# Patient Record
Sex: Female | Born: 1980
Health system: Southern US, Community
[De-identification: ages and names within clinical notes are randomized; demographics above are authoritative.]

## PROBLEM LIST (undated history)

## (undated) DIAGNOSIS — Z22322 Carrier or suspected carrier of Methicillin resistant Staphylococcus aureus: Secondary | ICD-10-CM

## (undated) DIAGNOSIS — R519 Headache, unspecified: Secondary | ICD-10-CM

## (undated) DIAGNOSIS — F419 Anxiety disorder, unspecified: Secondary | ICD-10-CM

## (undated) HISTORY — PX: DIAGNOSTIC LAPAROSCOPY: SUR761

## (undated) HISTORY — PX: TONSILLECTOMY: SUR1361

---

## 2006-01-23 ENCOUNTER — Emergency Department (HOSPITAL_COMMUNITY): Admission: EM | Admit: 2006-01-23 | Discharge: 2006-01-23 | Payer: Self-pay | Admitting: Emergency Medicine

## 2006-08-19 ENCOUNTER — Inpatient Hospital Stay (HOSPITAL_COMMUNITY): Admission: AD | Admit: 2006-08-19 | Discharge: 2006-08-19 | Payer: Self-pay | Admitting: Obstetrics and Gynecology

## 2006-09-16 ENCOUNTER — Inpatient Hospital Stay (HOSPITAL_COMMUNITY): Admission: AD | Admit: 2006-09-16 | Discharge: 2006-09-16 | Payer: Self-pay | Admitting: Obstetrics and Gynecology

## 2006-09-27 ENCOUNTER — Inpatient Hospital Stay (HOSPITAL_COMMUNITY): Admission: RE | Admit: 2006-09-27 | Discharge: 2006-09-30 | Payer: Self-pay | Admitting: Obstetrics and Gynecology

## 2007-08-03 ENCOUNTER — Ambulatory Visit (HOSPITAL_BASED_OUTPATIENT_CLINIC_OR_DEPARTMENT_OTHER): Admission: RE | Admit: 2007-08-03 | Discharge: 2007-08-03 | Payer: Self-pay | Admitting: Obstetrics and Gynecology

## 2011-01-06 NOTE — Op Note (Signed)
NAMETERITA, Caitlin Bryant           ACCOUNT NO.:  [id number]   MEDICAL RECORD NO.:  [id number]          PATIENT TYPE:  AMB   LOCATION:  [facility]                         FACILITY:  [facility]   PHYSICIAN:  Malva Limes, M.D.    DATE OF BIRTH:  [birth year]   DATE OF PROCEDURE:  08/03/2007  DATE OF DISCHARGE:                               OPERATIVE REPORT   PREOPERATIVE DIAGNOSIS:  Chronic abdominal and pelvic pain.   POSTOPERATIVE DIAGNOSIS:  Chronic abdominal and pelvic pain.   PROCEDURE:  Diagnostic laparoscopy.   SURGEON:  Malva Limes, M.D.   ANESTHESIA:  General endotracheal anesthesia.   ANTIBIOTICS:  Ancef 1 gram.   DRAINS:  A red rubber catheter in bladder.   COMPLICATIONS:  None.   SPECIMENS:  None.   ESTIMATED BLOOD LOSS:  Minimal.   DESCRIPTION OF PROCEDURE:  The patient was taken to the operating room  where general anesthetic was administered without complications.  She  was then placed in the dorsal lithotomy position and prepped with  Betadine.  Her bladder was drained with a red rubber catheter.  A Hulka  tenaculum was applied to the anterior cervical lip.  At this point the  umbilicus was injected with 0.25% Marcaine.  A vertical skin incision  was made.  The fascia was grasped with the Kocher and entered sharply.  The parietal peritoneum was entered sharply and #0 Vicryl suture was  placed in a pursestring fashion.  The Hasson cannula was placed into the  peritoneal cavity.  Three liters of carbon dioxide was insufflated.  The  patient was then placed in Trendelenburg.  The scope was then placed.  On examination the patient had a normal-appearing liver and gallbladder.  There was no evidence of any kind of abdominal adhesions.  The bowel  appeared to be normal.  The 5 mm port was placed in the suprapubic  region under direct visualization.  A grasper was then placed through.  On examination the patient had normal fallopian tubes and ovaries  bilaterally.  The  posterior cul-de-sac was completely normal.  The  appendix was seen.  It was free and without erythema.  There was no  evidence of endometriosis throughout the pelvis.  The adnexa were  completely free and normal.  The patient had minimal adhesions along the  C-section scar in the anterior cul-de-sac.  These were taken down with  blunt dissection.  It was felt that these were minimal and not likely  the cause of the patient's pain.  The uterus appeared to be normal, free  and normal.  The sigmoid colon appeared to be normal.   At this point the procedure was concluded.  Instruments were removed.  The pneumoperitoneum released.  The fascia was closed with #0 Vicryl  suture.  The skin was closed with #4-0 Vicryl suture.   DISPOSITION:  The patient will be discharged home.  She will follow up  in the office in two weeks.  At that time if the pain persists, the  patient may be started on Neurontin for chronic pelvic pain, or  transferred to the care of a pain clinic.  ______________________________  Malva Limes, M.D.     MA/MEDQ  D:  08/03/2007  T:  08/03/2007  Job:  [id number]

## 2011-01-09 NOTE — Op Note (Signed)
[name], Caitlin Bryant NO.:  [id number]   MEDICAL RECORD NO.:  [id number]          PATIENT TYPE:  INP   LOCATION:  9125                          FACILITY:  [facility]   PHYSICIAN:  Malva Limes, M.D.    DATE OF BIRTH:  1980/10/12   DATE OF PROCEDURE:  09/27/2006  DATE OF DISCHARGE:                               OPERATIVE REPORT   PREOPERATIVE DIAGNOSES:  1. Intrauterine pregnancy at term.  2. History of prior cesarean section.  3. Patient declines attempt at vaginal birth after cesarean section.   POSTOPERATIVE DIAGNOSES:  1. Intrauterine pregnancy at term.  2. History of prior cesarean section.  3. Patient declines attempt at vaginal birth after cesarean section.   PROCEDURE:  Repeat low transverse cesarean section.   SURGEON:  Malva Limes, M.D.   ASSISTANT:  Miguel Aschoff, M.D.   ANESTHESIA:  Spinal.   BLOOD LOSS:  900 mL.   SPECIMENS:  None.   COMPLICATIONS:  None.   FINDINGS:  The patient delivered one live viable female infant weighing  7 pounds 12 ounces.   PROCEDURE:  The patient was taken to the operating room where spinal  anesthetic was administered without complication.  She was then placed  in dorsal supine position with left lateral tilt.  The patient was  prepped with Betadine and a Foley catheter was placed.  She was then  draped in the usual fashion for this procedure.  An incision was made  through the previous scar.  This was carried down to the fascia.  Fascia  was entered in the midline and extended laterally with Mayo scissors.  The rectus muscles were then taken from the fascia with the Bovie.  Rectus muscle were divided in the midline, taken to superiorly and  inferiorly.  The parietal peritoneum was entered sharply and taken  superiorly and inferiorly.  The bladder flap was taken down sharply.  A  low transverse uterine incision was made in the midline and extended  laterally with blunt dissection.  Amniotic fluid was noted to be  clear.  The vacuum extractor was used to remove the infant.  Once the head was  delivered, the oropharynx and nostrils were bulb suctioned x1.  There  was a nuchal cord x1.  Remaining infant was then delivered.  Cord doubly  clamped and cut and the infant handed to the awaiting NICU team.  Placenta was manually removed.  The uterus was exteriorized.  Uterine  cavity was cleaned with wet lap.  The uterine incision was closed in a  single layer of 0 Monocryl in a running locking fashion.  Bladder flap  was closed using 2-0 Monocryl in a running fashion.  The uterus was  placed back into the abdominal cavity.  Hemostasis was again checked and  found to be adequate.  Parietal peritoneum and rectus muscles were  reapproximated in the midline using 2-0 Monocryl suture.  Fascia was  closed using 0 Monocryl suture in a running fashion.  The subcuticular  tissue was made hemostatic with the Bovie.  Stainless steel clips were  used to close the skin.  The patient tolerated  the procedure well.  She  was taken to the recovery room in stable condition.  Instrument and lap  counts correct x2.           ______________________________  Malva Limes, M.D.     MA/MEDQ  D:  09/27/2006  T:  09/27/2006  Job:  [id number]

## 2011-01-09 NOTE — Discharge Summary (Signed)
[name], Caitlin Bryant           ACCOUNT NO.:  [id number]   MEDICAL RECORD NO.:  [id number]          PATIENT TYPE:  INP   LOCATION:  9125                          FACILITY:  [facility]   PHYSICIAN:  Kendra H. Tenny Craw, MD     DATE OF BIRTH:  1981/07/06   DATE OF ADMISSION:  09/27/2006  DATE OF DISCHARGE:  09/30/2006                               DISCHARGE SUMMARY   FINAL DIAGNOSIS:  Intrauterine pregnancy at term.  History of prior  cesarean section.  The patient declines attempted vaginal birth after  cesarean section.  Positive group B strep.   PROCEDURE:  Repeat low transverse cesarean section.  Surgeon:  Dr. Malva Limes.  Assistant:  Dr. Miguel Aschoff.  Complications:  None.   This 30 year old, G2, P1-0-0-2 presents at term for a repeat cesarean  section.  The patient had a prior cesarean section with her last  pregnancy and desires a repeat with this pregnancy as well.  Her  antepartum course had been uncomplicated.  She did have a positive group  B strep culture obtained in the office around 35 weeks.  She is taken to  the operating room at term on February4,2008 for a repeat low transverse  cesarean section by Dr. Malva Limes.  She had the delivery of a 7-  pound 12-ounce female infant with Apgars of 9and 9.  Delivery went  without complications.  The patient's postoperative course was benign.  She did have some postoperative temperatures up to about 101 on  postoperative day #1.  The patient also was having a cough and sinus  congestion at this time.  A chest x-ray was performed which showed  bilateral atelectasis with small pleural effusion but no pneumonia.  The  patient's white count was only 8.2 at that point.  By postoperative day  #2, temperatures returned to normal.  White blood cell count did not  increase.  IV antibiotics were stopped.  The patient was started on oral  Ceftin 5 mg every 12 hours and was felt ready for discharge by  postoperative day #3.  She was sent home  on Ceftin 500 mg b.i.d. for her  upper respiratory infection.  She was told to decrease activities, told  to continue her vitamins, was given Percocet 1 to 2 every 4 to 6 hours  as needed for pain.  Was to follow up in our office in 6 weeks.  Of  course to call with increased temperatures, bleeding or pain.   DISCHARGE LABORATORY DATA:  The patient had a hemoglobin of 11.7, white  blood cell count of 6.5, and platelets of 138,000.      [name], P.A.-C.      Freddrick March. Tenny Craw, MD  Electronically Signed    MB/MEDQ  D:  10/26/2006  T:  10/26/2006  Job:  [id number]

## 2011-06-01 LAB — CBC
HCT: 38.4
Hemoglobin: 13.5
MCV: 85.6
WBC: 6.7

## 2011-06-01 LAB — PREGNANCY, URINE: Preg Test, Ur: NEGATIVE

## 2012-11-18 ENCOUNTER — Emergency Department (HOSPITAL_COMMUNITY)
Admission: EM | Admit: 2012-11-18 | Discharge: 2012-11-18 | Disposition: A | Discharge status: Home / Self Care | Payer: BC Managed Care – PPO | Source: Home / Self Care

## 2012-11-18 ENCOUNTER — Encounter (HOSPITAL_COMMUNITY): Payer: Self-pay

## 2012-11-18 ENCOUNTER — Other Ambulatory Visit (HOSPITAL_COMMUNITY)
Admission: RE | Admit: 2012-11-18 | Discharge: 2012-11-18 | Disposition: A | Discharge status: Home / Self Care | Payer: BC Managed Care – PPO | Source: Ambulatory Visit | Attending: Family Medicine | Admitting: Family Medicine

## 2012-11-18 DIAGNOSIS — N760 Acute vaginitis: Secondary | ICD-10-CM | POA: Insufficient documentation

## 2012-11-18 DIAGNOSIS — N949 Unspecified condition associated with female genital organs and menstrual cycle: Secondary | ICD-10-CM

## 2012-11-18 DIAGNOSIS — R42 Dizziness and giddiness: Secondary | ICD-10-CM

## 2012-11-18 DIAGNOSIS — Z113 Encounter for screening for infections with a predominantly sexual mode of transmission: Secondary | ICD-10-CM | POA: Insufficient documentation

## 2012-11-18 LAB — POCT URINALYSIS DIP (DEVICE)
Bilirubin Urine: NEGATIVE
Specific Gravity, Urine: 1.01 (ref 1.005–1.030)

## 2012-11-18 LAB — POCT PREGNANCY, URINE: Preg Test, Ur: NEGATIVE

## 2012-11-18 NOTE — ED Provider Notes (Signed)
Caitlin Bryant is a 32 y.o. female who presents to Urgent Care today for 2 issues.  1) abdominal pain starting over the last several days. Patient notes bilateral lower corner and abdominal pain present for several days. This is not associated with any nausea vomiting diarrhea fever or chills.  She notes some pelvic discomfort or this week and was seen by her primary care provider who obtained a urine culture which was reportedly negative.  Additionally patient notes some mild vaginal discharge started over the last several weeks.  Additionally she has not had a period in 8 days which is unusual for her as she normally has regular cycles. She does note a contraception however her husband has vasectomy. She does not think that she could be pregnant.  She has an appointment with her OB/GYN in 5 or 6 days.  2) vertigo: Patient got off the toilet today and she stood up noted an intense feeling of the room spinning for several seconds. She denies any feeling of syncope or near syncope. Additionally she denies any chest pain palpitations or shortness of breath with this.  This is the first time this has happened.  The vertigo rapidly resolved and has not recurred.    PMH reviewed. History of 2 C-sections History  Substance Use Topics  . Smoking status: Not on file  . Smokeless tobacco: Not on file  . Alcohol Use: Not on file   ROS as above Medications reviewed. No current facility-administered medications for this encounter.   No current outpatient prescriptions on file.    Exam:  BP 112/76  Pulse 73  Temp(Src) 98.4 F (36.9 C) (Oral)  Resp 10  SpO2 100% Gen: Well NAD HEENT: EOMI,  MMM, PERRLA.  Lungs: CTABL Nl WOB Heart: RRR no MRG Abd: NABS, nondistended. Mildly tender bilateral lower quadrants without rebound or guarding.  Exts: Non edematous BL  LE, warm and well perfused.  Pelvic: Normal external genitalia. Vaginal canal with white discharge. No cervical motion tenderness no  adnexal mass or tenderness. Uterus is slightly large but in normal position.  Patient had significant discomfort with the speculum portion of the exam. It was discontinued prior to full visualization of the cervix.  Neuro: Alert and oriented normally conversant. Dix-Hallpike maneuver patient had several beats of nystagmus associated with mild vertigo with left rotation of the head.  Balance is intact gait is normal.   Results for orders placed during the hospital encounter of 11/18/12 (from the past 24 hour(s))  POCT URINALYSIS DIP (DEVICE)     Status: Abnormal   Collection Time    11/18/12  5:18 PM      Result Value Range   Glucose, UA NEGATIVE  NEGATIVE mg/dL   Bilirubin Urine NEGATIVE  NEGATIVE   Ketones, ur NEGATIVE  NEGATIVE mg/dL   Specific Gravity, Urine 1.010  1.005 - 1.030   Hgb urine dipstick TRACE (*) NEGATIVE   pH 7.5  5.0 - 8.0   Protein, ur NEGATIVE  NEGATIVE mg/dL   Urobilinogen, UA 0.2  0.0 - 1.0 mg/dL   Nitrite NEGATIVE  NEGATIVE   Leukocytes, UA NEGATIVE  NEGATIVE  POCT PREGNANCY, URINE     Status: None   Collection Time    11/18/12  5:22 PM      Result Value Range   Preg Test, Ur NEGATIVE  NEGATIVE   No results found.  Assessment and Plan: 32 y.o. female with  1) pelvic pain associated with mild vaginal discharge. Possibly bacterial vaginosis.  Does not appear to be life threatening tonight.  Plan: Reassurance and watchful waiting. Pelvic swab for gonorrhea, Chlamydia, bacterial vaginosis, yeast. Followup with OB/GYN when necessary.  2) vertigo: Dizzy feeling likely vertigo. Possibly mildly positive Dix-Hallpike maneuver. No serious intracranial pathology suspected tonight. Additionally this does not appear to be cardiogenic syncope or near-syncope.  Plan to followup with family practice Dr. if needed.   Discussed warning signs or symptoms. Please see discharge instructions. Patient expresses understanding.     Rodolph Bong, MD 11/18/12 1800

## 2012-11-18 NOTE — ED Notes (Signed)
C/o she missed menses a couple of weeks ago, saw her MD who did a negative UCG test and UA test; since then, has felt increasingly weak , dizzy, nauseated, bloated in lower abdominal area , dizzy. Has an appt to see her GYN next week. Husband had vasectomy. States she had a brief episode of clear vaginal d.c at time her menses was due, but no bleeding or spotting

## 2012-11-22 NOTE — ED Provider Notes (Signed)
Medical screening examination/treatment/procedure(s) were performed by resident physician or non-physician practitioner and as supervising physician I was immediately available for consultation/collaboration.   Broady Lafoy DOUGLAS MD.   Lavern Maslow D Malinda Mayden, MD 11/22/12 2050 

## 2012-11-23 NOTE — ED Notes (Signed)
GC/Chlamydia neg., Affirm: Candida and Trich neg., Gardnerella pos. No Rx. noted. Message sent to Dr. Clementeen Graham.

## 2012-11-24 ENCOUNTER — Other Ambulatory Visit: Payer: Self-pay | Admitting: Obstetrics and Gynecology

## 2012-11-25 ENCOUNTER — Telehealth (HOSPITAL_COMMUNITY): Payer: Self-pay | Admitting: *Deleted

## 2012-11-25 MED ORDER — METRONIDAZOLE 500 MG PO TABS: 500.0000 mg | ORAL_TABLET | Freq: Two times a day (BID) | ORAL | Status: DC

## 2012-11-25 NOTE — ED Notes (Signed)
4/3 Message sent to Dr. Tressia Danas for orders.  4/4 Flagyl ordered. I called pt. and left a message to call. Vassie Moselle 11/25/2012

## 2012-11-28 ENCOUNTER — Telehealth (HOSPITAL_COMMUNITY): Payer: Self-pay | Admitting: *Deleted

## 2012-11-28 NOTE — ED Notes (Signed)
4/4  Order for Flagyl obtained from Dr. Tressia Danas.  I called and left a message for patient to call. Cherly Anderson M

## 2012-11-30 ENCOUNTER — Telehealth (HOSPITAL_COMMUNITY): Payer: Self-pay | Admitting: *Deleted

## 2012-12-01 ENCOUNTER — Telehealth (HOSPITAL_COMMUNITY): Payer: Self-pay | Admitting: *Deleted

## 2012-12-01 NOTE — ED Notes (Signed)
Unable to contact pt. by phone.  Letter sent instructing pt. to call us with her pharmacy, so we can call in her Rx. of Flagyl. Vassie Moselle 12/01/2012

## 2013-11-10 ENCOUNTER — Emergency Department (INDEPENDENT_AMBULATORY_CARE_PROVIDER_SITE_OTHER)
Admission: EM | Admit: 2013-11-10 | Discharge: 2013-11-10 | Disposition: A | Discharge status: Home / Self Care | Payer: Self-pay | Source: Home / Self Care | Attending: Family Medicine | Admitting: Family Medicine

## 2013-11-10 ENCOUNTER — Encounter (HOSPITAL_COMMUNITY): Payer: Self-pay | Admitting: Emergency Medicine

## 2013-11-10 DIAGNOSIS — IMO0002 Reserved for concepts with insufficient information to code with codable children: Secondary | ICD-10-CM

## 2013-11-10 HISTORY — DX: Carrier or suspected carrier of methicillin resistant Staphylococcus aureus: Z22.322

## 2013-11-10 MED ORDER — DOXYCYCLINE HYCLATE 100 MG PO CAPS: 100.0000 mg | ORAL_CAPSULE | Freq: Two times a day (BID) | ORAL | Status: DC

## 2013-11-10 NOTE — Discharge Instructions (Signed)
Fingertip Infection When an infection is around the nail, it is called a paronychia. When it appears over the tip of the finger, it is called a felon. These infections are due to minor injuries or cracks in the skin. If they are not treated properly, they can lead to bone infection and permanent damage to the fingernail. Incision and drainage is necessary if a pus pocket (an abscess) has formed. Antibiotics and pain medicine may also be needed. Keep your hand elevated for the next 2-3 days to reduce swelling and pain. If a pack was placed in the abscess, it should be removed in 1-2 days by your caregiver. Soak the finger in warm water for 20 minutes 4 times daily to help promote drainage. Keep the hands as dry as possible. Wear protective gloves with cotton liners. See your caregiver for follow-up care as recommended.  HOME CARE INSTRUCTIONS   Keep wound clean, dry and dressed as suggested by your caregiver.  Soak in warm salt water for fifteen minutes, four times per day for bacterial infections.  Your caregiver will prescribe an antibiotic if a bacterial infection is suspected. Take antibiotics as directed and finish the prescription, even if the problem appears to be improving before the medicine is gone.  Only take over-the-counter or prescription medicines for pain, discomfort, or fever as directed by your caregiver. SEEK IMMEDIATE MEDICAL CARE IF:  There is redness, swelling, or increasing pain in the wound.  Pus or any other unusual drainage is coming from the wound.  An unexplained oral temperature above 102 F (38.9 C) develops.  You notice a foul smell coming from the wound or dressing. MAKE SURE YOU:   Understand these instructions.  Monitor your condition.  Contact your caregiver if you are getting worse or not improving. Document Released: 09/17/2004 Document Revised: 11/02/2011 Document Reviewed: 09/13/2008 Exit[employer] Patient Information 2014 [employer].  

## 2013-11-10 NOTE — ED Provider Notes (Signed)
CSN: [id number]     Arrival date & time 11/10/13  1951 History   First MD Initiated Contact with Patient 11/10/13 2111     Chief Complaint  Patient presents with  . Abscess   (Consider location/radiation/quality/duration/timing/severity/associated sxs/prior Treatment) Patient is a 33 y.o. female presenting with abscess. The history is provided by the patient. No language interpreter was used.  Abscess Location:  Finger Finger abscess location:  L thumb Abscess quality: draining and redness   Red streaking: no   Progression:  Partially resolved Worsened by:  Nothing tried Ineffective treatments:  Oral antibiotics Pt reports she had a rash while taking bactrim  Past Medical History  Diagnosis Date  . MRSA (methicillin resistant staph aureus) culture positive    Past Surgical History  Procedure Laterality Date  . Cesarean section  2004 and 2008    x 2   Family History  Problem Relation Age of Onset  . Hypertension Mother   . Diabetes Mother   . Osteoarthritis Mother   . Fibromyalgia Mother   . Heart attack Father     Defibrillator   History  Substance Use Topics  . Smoking status: Never Smoker   . Smokeless tobacco: Not on file  . Alcohol Use: No   OB History   Grav Para Term Preterm Abortions TAB SAB Ect Mult Living                 Review of Systems  Skin: Positive for wound.  All other systems reviewed and are negative.    Allergies  Sulfa antibiotics  Home Medications   Current Outpatient Rx  Name  Route  Sig  Dispense  Refill  . doxycycline (VIBRAMYCIN) 100 MG capsule   Oral   Take 1 capsule (100 mg total) by mouth 2 (two) times daily.   20 capsule   0   . metroNIDAZOLE (FLAGYL) 500 MG tablet   Oral   Take 1 tablet (500 mg total) by mouth 2 (two) times daily.   14 tablet   0    LMP 10/19/2013 Physical Exam  Constitutional: She is oriented to person, place, and time. She appears well-developed and well-nourished.  Musculoskeletal: She  exhibits tenderness.  Swollen distal finger around nail,   No fluctuance  Neurological: She is alert and oriented to person, place, and time. She has normal reflexes.  Skin: Skin is warm. There is erythema.    ED Course  Procedures (including critical care time) Labs Review Labs Reviewed - No data to display Imaging Review No results found.   MDM   1. Paronychia    Doxycycline  Continue soaking    Elson AreasLeslie K Sofia, [place] JerseyPA-C 11/10/13 2137

## 2013-11-10 NOTE — ED Notes (Addendum)
Abscess L thumb.  Had I and D and culture done at the office-Dr. Jeannetta NapElkins on 3/10 that was pos. for MRSA.  States she had swelling up to her elbow.  Started Sulfa drugs that night and was taking every 12 hrs.  On 3/17 broke out in rash on both arms and lower abdomen.  So Dr. told her to stop the antibioitc.  States its still sore on L thumb and all her fingers.  Swelling has gone down.

## 2013-11-11 NOTE — ED Provider Notes (Signed)
Medical screening examination/treatment/procedure(s) were performed by a resident physician or non-physician practitioner and as the supervising physician I was immediately available for consultation/collaboration.  Trenda Corliss, MD    Cloy Cozzens S Brieann Osinski, MD 11/11/13 0841 

## 2013-12-01 ENCOUNTER — Encounter (HOSPITAL_COMMUNITY): Payer: Self-pay | Admitting: Emergency Medicine

## 2013-12-01 ENCOUNTER — Emergency Department (INDEPENDENT_AMBULATORY_CARE_PROVIDER_SITE_OTHER)
Admission: EM | Admit: 2013-12-01 | Discharge: 2013-12-01 | Disposition: A | Discharge status: Home / Self Care | Payer: Self-pay | Source: Home / Self Care

## 2013-12-01 DIAGNOSIS — L089 Local infection of the skin and subcutaneous tissue, unspecified: Secondary | ICD-10-CM

## 2013-12-01 DIAGNOSIS — B9789 Other viral agents as the cause of diseases classified elsewhere: Secondary | ICD-10-CM

## 2013-12-01 DIAGNOSIS — B369 Superficial mycosis, unspecified: Secondary | ICD-10-CM

## 2013-12-01 MED ORDER — VALACYCLOVIR HCL 1 G PO TABS: 1000.0000 mg | ORAL_TABLET | Freq: Two times a day (BID) | ORAL | Status: AC

## 2013-12-01 MED ORDER — FLUCONAZOLE 150 MG PO TABS: ORAL_TABLET | ORAL | Status: DC

## 2013-12-01 NOTE — ED Provider Notes (Signed)
Medical screening examination/treatment/procedure(s) were performed by resident physician or non-physician practitioner and as supervising physician I was immediately available for consultation/collaboration.   Barkley BrunsKINDL,Latorya Bautch DOUGLAS MD.   Linna HoffJames D Jahleel Stroschein, MD 12/01/13 1300

## 2013-12-01 NOTE — ED Notes (Signed)
Patient has had an infection of left thumb.  Thumb has been evaluated by pcp and then at [facility].  Patient has been having issues tolerating medications prescribed for infection.  Reports pcp informed her this was a mrsa infection.  One antibiotic caused thrush.  Antibiotic changed and now has yeast infection.  Patient concerned infection is reoccurring, thumb is looking worse by developing bumps and left forearm swelling per patient and friend

## 2013-12-01 NOTE — ED Provider Notes (Signed)
CSN: [id number]     Arrival date & time 12/01/13  6387 History   First MD Initiated Contact with Patient 12/01/13 1111     Chief Complaint  Patient presents with  . Follow-up   (Consider location/radiation/quality/duration/timing/severity/associated sxs/prior Treatment) HPI Comments: 33 year old female presents for followup of bumps to the base of the left thumb. Approximately 20 days ago she was diagnosed with paronychia that was cultured out to be MRSA. She was started on Septra and take it for 8 days. During that time she developed urticaria and stopped the medication. She followed up in the urgent care and her medication was changed to doxycycline. During this time she developed oral candidiasis that her thumb was getting much better. Since completing the doxycycline the signs of infection have abated. In the past few days she has developed a pruritic papular vesicular crop at the base of the nail. There is minimal erythema and no appreciable swelling. She has been soaking the finger daily in warm water and placing a Band-Aid over it. In addition she is using intravaginal antifungal cream for vaginal candidiasis. There are no systemic symptoms.   Past Medical History  Diagnosis Date  . MRSA (methicillin resistant staph aureus) culture positive    Past Surgical History  Procedure Laterality Date  . Cesarean section  2004 and 2008    x 2   Family History  Problem Relation Age of Onset  . Hypertension Mother   . Diabetes Mother   . Osteoarthritis Mother   . Fibromyalgia Mother   . Heart attack Father     Defibrillator   History  Substance Use Topics  . Smoking status: Never Smoker   . Smokeless tobacco: Not on file  . Alcohol Use: No   OB History   Grav Para Term Preterm Abortions TAB SAB Ect Mult Living                 Review of Systems  Constitutional: Negative.   HENT: Negative.   Respiratory: Negative.   Gastrointestinal: Negative.   Skin:       As per HPI   Neurological: Negative.     Allergies  Sulfa antibiotics  Home Medications   Current Outpatient Rx  Name  Route  Sig  Dispense  Refill  . doxycycline (VIBRAMYCIN) 100 MG capsule   Oral   Take 1 capsule (100 mg total) by mouth 2 (two) times daily.   20 capsule   0   . fluconazole (DIFLUCAN) 150 MG tablet      1 tablet every 2 days x 3 doses   3 tablet   0   . metroNIDAZOLE (FLAGYL) 500 MG tablet   Oral   Take 1 tablet (500 mg total) by mouth 2 (two) times daily.   14 tablet   0   . valACYclovir (VALTREX) 1000 MG tablet   Oral   Take 1 tablet (1,000 mg total) by mouth 2 (two) times daily.   8 tablet   0    BP 123/75  Pulse 74  Temp(Src) 98.3 F (36.8 C) (Oral)  Resp 16  SpO2 95%  LMP 10/19/2013 Physical Exam  Nursing note and vitals reviewed. Constitutional: She is oriented to person, place, and time. She appears well-developed and well-nourished. No distress.  Eyes: Conjunctivae and EOM are normal.  Pulmonary/Chest: Effort normal. No respiratory distress.  Musculoskeletal: Normal range of motion. She exhibits no edema and no tenderness.  Neurological: She is alert and oriented to person, place, and  time. She exhibits normal muscle tone.  Skin: Skin is warm and dry.  Approx 3 pruritic papulovesicular lesions to the skin at the base of L thumb. No purulence or drainage. Minimal erythema. No paronychia. No lymphangitis. No other digits affected  Psychiatric: She has a normal mood and affect.    ED Course  Procedures (including critical care time) Labs Review Labs Reviewed - No data to display Imaging Review No results found.   MDM   1. Fungal infection of skin   2. Localized viral skin infection     These few papulovesicular lesions to the base of the L thumb nail do not appear to be bacterial, rather viral vs fungal etio. Valtrex and diflucan that may help with vag candidiasis, too.Keep finger dry. Stop soaking.  Rechk if worse,    Caitlin Rasmussenavid Renna Kilmer,  NP 12/01/13 1218

## 2013-12-01 NOTE — Discharge Instructions (Signed)
Most likely cause of this new skin finding includes possible yeast infection locally or viral. Will treat with  Valtrex as directed and Diflucan that will also help with your vaginal yeast infection. Apply the topical yeast infection cream to the finger twice a day, and keep finger dry. Probably do not need to soak it any longer.

## 2014-12-18 ENCOUNTER — Other Ambulatory Visit: Payer: Self-pay | Admitting: Obstetrics and Gynecology

## 2014-12-19 LAB — CYTOLOGY - PAP

## 2015-01-31 ENCOUNTER — Ambulatory Visit (INDEPENDENT_AMBULATORY_CARE_PROVIDER_SITE_OTHER): Payer: BLUE CROSS/BLUE SHIELD | Admitting: Internal Medicine

## 2015-01-31 ENCOUNTER — Encounter: Payer: Self-pay | Admitting: Internal Medicine

## 2015-01-31 VITALS — BP 106/68 | HR 74 | Temp 98.2°F | Resp 12 | Ht 63.0 in | Wt 158.1 lb

## 2015-01-31 DIAGNOSIS — F411 Generalized anxiety disorder: Secondary | ICD-10-CM | POA: Diagnosis not present

## 2015-01-31 DIAGNOSIS — J3089 Other allergic rhinitis: Secondary | ICD-10-CM | POA: Diagnosis not present

## 2015-01-31 DIAGNOSIS — J309 Allergic rhinitis, unspecified: Secondary | ICD-10-CM | POA: Insufficient documentation

## 2015-01-31 MED ORDER — DULOXETINE HCL 20 MG PO CPEP: 20.0000 mg | ORAL_CAPSULE | Freq: Every day | ORAL | Status: DC

## 2015-01-31 NOTE — Progress Notes (Signed)
   Subjective:    Patient ID: Caitlin Bryant, female    DOB: 08/29/1980, 34 y.o.   MRN: [id number]  HPI The patient is a new 34 YO female who is coming in today to discuss her anxiety and stress. She has two small children who both have some health and emotional problems. She is also working part time. She does have panic attacks several times per month which are usually when lots of stress is going on. She is by herself during the day and during this time she feels relaxed and does not have problems. Everyday she struggles with her mood and anxiety. She denies SI/HI. Has not tried anything. No regular exercise and no de-stressing activities.   Her other problem is allergies which bother her most of the time but especially in the spring. She denies fevers or chills. Nasal congestion and ear pain for the last several months. She does not take anything for it as she does not like to take medicines. Overall about the same and not getting worse.   PMH, [facility], social history reviewed and updated with the patient.   Review of Systems  Constitutional: Positive for fatigue. Negative for fever, activity change and appetite change.  HENT: Positive for congestion, ear pain and rhinorrhea. Negative for facial swelling, postnasal drip, sinus pressure, sore throat and trouble swallowing.   Eyes: Negative.   Respiratory: Negative.   Cardiovascular: Negative.   Gastrointestinal: Negative.   Musculoskeletal: Negative.   Skin: Negative.   Neurological: Negative.   Psychiatric/Behavioral: Positive for dysphoric mood, decreased concentration and agitation. Negative for suicidal ideas, behavioral problems and self-injury. The patient is nervous/anxious.       Objective:   Physical Exam  Constitutional: She is oriented to person, place, and time. She appears well-developed and well-nourished.  HENT:  Head: Normocephalic and atraumatic.  TMs normal, oropharynx with redness and turbinates with swelling    Eyes: EOM are normal.  Neck: Normal range of motion.  Cardiovascular: Normal rate and regular rhythm.   Pulmonary/Chest: Effort normal and breath sounds normal. No respiratory distress. She has no wheezes.  Abdominal: Soft. She exhibits no distension. There is no tenderness.  Musculoskeletal: She exhibits no edema.  Neurological: She is alert and oriented to person, place, and time. Coordination normal.  Skin: Skin is warm and dry.  Psychiatric:  Guarded and needs encouragement to talk   Filed Vitals:   01/31/15 0815  BP: 106/68  Pulse: 74  Temp: 98.2 F (36.8 C)  TempSrc: Oral  Resp: 12  Height: 5\' 3"  (1.6 m)  Weight: 158 lb 1.9 oz (71.723 kg)  SpO2: 92%      Assessment & Plan:

## 2015-01-31 NOTE — Patient Instructions (Signed)
We have sent in a medicine called duloxetine (also called cymbalta) which is a medicine that helps with pain as well as with mood and anxiety. Take 1 pill a day and after 2 weeks we can increase the dose if needed. It may take 6-8 weeks until you feel the full effects of the medicines.   For the best results we recommend working on other stress reducing and coping skills to help the medicine work better. You may even notice a benefit to seeing your counselor by yourself as well as with your family to give you a chance to let out your stress and emotions instead of keeping it inside.   Come back in about 2-3 months so we can check in on you. If you have problems with the medicine or are not feeling better sooner please call us and do not wait 2-3 months. We will check your blood work the next time you are here.   Stress and Stress Management Stress is a normal reaction to life events. It is what you feel when life demands more than you are used to or more than you can handle. Some stress can be useful. For example, the stress reaction can help you catch the last bus of the day, study for a test, or meet a deadline at work. But stress that occurs too often or for too long can cause problems. It can affect your emotional health and interfere with relationships and normal daily activities. Too much stress can weaken your immune system and increase your risk for physical illness. If you already have a medical problem, stress can make it worse. CAUSES  All sorts of life events may cause stress. An event that causes stress for one person may not be stressful for another person. Major life events commonly cause stress. These may be positive or negative. Examples include losing your job, moving into a new home, getting married, having a baby, or losing a loved one. Less obvious life events may also cause stress, especially if they occur day after day or in combination. Examples include working long hours, driving in  traffic, caring for children, being in debt, or being in a difficult relationship. SIGNS AND SYMPTOMS Stress may cause emotional symptoms including, the following:  Anxiety. This is feeling worried, afraid, on edge, overwhelmed, or out of control.  Anger. This is feeling irritated or impatient.  Depression. This is feeling sad, down, helpless, or guilty.  Difficulty focusing, remembering, or making decisions. Stress may cause physical symptoms, including the following:   Aches and pains. These may affect your head, neck, back, stomach, or other areas of your body.  Tight muscles or clenched jaw.  Low energy or trouble sleeping. Stress may cause unhealthy behaviors, including the following:   Eating to feel better (overeating) or skipping meals.  Sleeping too little, too much, or both.  Working too much or putting off tasks (procrastination).  Smoking, drinking alcohol, or using drugs to feel better. DIAGNOSIS  Stress is diagnosed through an assessment by your health care provider. Your health care provider will ask questions about your symptoms and any stressful life events.Your health care provider will also ask about your medical history and may order blood tests or other tests. Certain medical conditions and medicine can cause physical symptoms similar to stress. Mental illness can cause emotional symptoms and unhealthy behaviors similar to stress. Your health care provider may refer you to a mental health professional for further evaluation.  TREATMENT  Stress management  is the recommended treatment for stress.The goals of stress management are reducing stressful life events and coping with stress in healthy ways.  Techniques for reducing stressful life events include the following:  Stress identification. Self-monitor for stress and identify what causes stress for you. These skills may help you to avoid some stressful events.  Time management. Set your priorities, keep a  calendar of events, and learn to say "no." These tools can help you avoid making too many commitments. Techniques for coping with stress include the following:  Rethinking the problem. Try to think realistically about stressful events rather than ignoring them or overreacting. Try to find the positives in a stressful situation rather than focusing on the negatives.  Exercise. Physical exercise can release both physical and emotional tension. The key is to find a form of exercise you enjoy and do it regularly.  Relaxation techniques. These relax the body and mind. Examples include yoga, meditation, tai chi, biofeedback, deep breathing, progressive muscle relaxation, listening to music, being out in nature, journaling, and other hobbies. Again, the key is to find one or more that you enjoy and can do regularly.  Healthy lifestyle. Eat a balanced diet, get plenty of sleep, and do not smoke. Avoid using alcohol or drugs to relax.  Strong support network. Spend time with family, friends, or other people you enjoy being around.Express your feelings and talk things over with someone you trust. Counseling or talktherapy with a mental health professional may be helpful if you are having difficulty managing stress on your own. Medicine is typically not recommended for the treatment of stress.Talk to your health care provider if you think you need medicine for symptoms of stress. HOME CARE INSTRUCTIONS  Keep all follow-up visits as directed by your health care provider.  Take all medicines as directed by your health care provider. SEEK MEDICAL CARE IF:  Your symptoms get worse or you start having new symptoms.  You feel overwhelmed by your problems and can no longer manage them on your own. SEEK IMMEDIATE MEDICAL CARE IF:  You feel like hurting yourself or someone else. Document Released: 02/03/2001 Document Revised: 12/25/2013 Document Reviewed: 04/04/2013 [facility] Patient Information 2015  [place], [place]. This information is not intended to replace advice given to you by your health care provider. Make sure you discuss any questions you have with your health care provider.

## 2015-01-31 NOTE — Progress Notes (Signed)
Pre visit review using our clinic review tool, if applicable. No additional management support is needed unless otherwise documented below in the visit note. 

## 2015-01-31 NOTE — Assessment & Plan Note (Addendum)
Rx for flonase given today as she does not like taking pills. Talked to her about avoiding allergens and no pets in the bedroom. No indication for antibiotics today.

## 2015-01-31 NOTE — Assessment & Plan Note (Signed)
She is seeing counselor for one of her children and encouraged her to see privately for herself since she is shy in groups and has a hard time opening up. Also talked to her about stress reducing strategies when she has time for herself. Given her concurrent vague pain complaints (and mother with fibromyalgia and herself diagnosed previously) will try cymbalta first. Will see her back in 2-3 months and adjust as needed.

## 2015-02-08 ENCOUNTER — Telehealth: Payer: Self-pay | Admitting: Internal Medicine

## 2015-02-08 NOTE — Telephone Encounter (Signed)
Pt called in said that the DULoxetine (CYMBALTA) 20 MG capsule [[id number]]  Is making her sick and she can not eat.  She wants to know is there something else she can take??    Best number [phone]

## 2015-02-11 MED ORDER — PAROXETINE HCL 10 MG PO TABS: 10.0000 mg | ORAL_TABLET | Freq: Every day | ORAL | Status: DC

## 2015-02-11 NOTE — Telephone Encounter (Signed)
Patient informed. 

## 2015-02-11 NOTE — Telephone Encounter (Signed)
Can stop and try paroxetine instead. Have her take with food to decrease risk of nausea.

## 2015-02-22 ENCOUNTER — Ambulatory Visit (INDEPENDENT_AMBULATORY_CARE_PROVIDER_SITE_OTHER)
Admission: RE | Admit: 2015-02-22 | Discharge: 2015-02-22 | Disposition: A | Discharge status: Home / Self Care | Payer: BLUE CROSS/BLUE SHIELD | Source: Ambulatory Visit | Attending: Internal Medicine | Admitting: Internal Medicine

## 2015-02-22 ENCOUNTER — Encounter: Payer: Self-pay | Admitting: Internal Medicine

## 2015-02-22 ENCOUNTER — Ambulatory Visit (INDEPENDENT_AMBULATORY_CARE_PROVIDER_SITE_OTHER): Payer: BLUE CROSS/BLUE SHIELD | Admitting: Internal Medicine

## 2015-02-22 VITALS — BP 122/80 | HR 85 | Temp 98.0°F | Ht 63.0 in | Wt 161.8 lb

## 2015-02-22 DIAGNOSIS — M25572 Pain in left ankle and joints of left foot: Secondary | ICD-10-CM

## 2015-02-22 DIAGNOSIS — M25372 Other instability, left ankle: Secondary | ICD-10-CM | POA: Insufficient documentation

## 2015-02-22 MED ORDER — HYDROCODONE-ACETAMINOPHEN 5-325 MG PO TABS: 1.0000 | ORAL_TABLET | Freq: Four times a day (QID) | ORAL | Status: DC | PRN

## 2015-02-22 NOTE — Progress Notes (Signed)
   Subjective:    Patient ID: Caitlin DuffelConstance D Yang, female    DOB: 1980-12-31, 34 y.o.   MRN: [id number]  HPI  Here to f/u after a misstep and turned left ankle on coming down with the foot with inversion, rather severe, with immed pain, swelling, now with slight bruising.  Pt denies chest pain, increased sob or doe, wheezing, orthopnea, PND, increased LE swelling, palpitations, dizziness or syncope. Past Medical History  Diagnosis Date  . MRSA (methicillin resistant staph aureus) culture positive    Past Surgical History  Procedure Laterality Date  . Cesarean section  2004 and 2008    x 2    reports that she has never smoked. She does not have any smokeless tobacco history on file. She reports that she does not drink alcohol or use illicit drugs. family history includes Diabetes in her mother; Fibromyalgia in her mother; Heart attack in her father; Hypertension in her mother; Osteoarthritis in her mother. Allergies  Allergen Reactions  . Sulfa Antibiotics Rash   Current Outpatient Prescriptions on File Prior to Visit  Medication Sig Dispense Refill  . ORSYTHIA 0.1-20 MG-MCG tablet Take 1 tablet by mouth daily.   1  . PARoxetine (PAXIL) 10 MG tablet Take 1 tablet (10 mg total) by mouth daily. 30 tablet 1   No current facility-administered medications on file prior to visit.   Review of Systems All otherwise neg per pt     Objective:   Physical Exam BP 122/80 mmHg  Pulse 85  Temp(Src) 98 F (36.7 C) (Oral)  Ht 5\' 3"  (1.6 m)  Wt 161 lb 12 oz (73.369 kg)  BMI 28.66 kg/m2  SpO2 96%  LMP 02/19/2015 VS noted, not ill appearing Constitutional: Pt appears in no significant distress HENT: Head: NCAT.  Right Ear: External ear normal.  Left Ear: External ear normal.  Eyes: . Pupils are equal, round, and reactive to light. Conjunctivae and EOM are normal Neck: Normal range of motion. Neck supple.  Cardiovascular: Normal rate and regular rhythm.   Pulmonary/Chest: Effort normal and  breath sounds without rales or wheezing.  Left ankle with lateral 2+ swelling, tender, worse to inversion foot, o/w neurovasc intact, slight bruising near heal noted Neurological: Pt is alert. Not confused , motor grossly intact Skin: Skin is warm. No rash, no LE edema Psychiatric: Pt behavior is normal. No agitation. mild nervous    Assessment & Plan:

## 2015-02-22 NOTE — Patient Instructions (Signed)
Please take all new medication as prescribed - the pain medication  Please use the left ankle brace for comfort and safety until improved  OK to use Ice for 15 min three times daily  You can also use alleve for pain if needed (1-2 twice per day as needed)  Please go to the XRAY Department in the Basement (go straight as you get off the elevator) for the x-ray testing  You will be contacted by phone if any changes need to be made immediately.  Otherwise, you will receive a letter about your results with an explanation, but please check with MyChart first.  Please remember to sign up for MyChart if you have not done so, as this will be important to you in the future with finding out test results, communicating by private email, and scheduling acute appointments online when needed.

## 2015-02-22 NOTE — Progress Notes (Signed)
Pre visit review using our clinic review tool, if applicable. No additional management support is needed unless otherwise documented below in the visit note. 

## 2015-02-22 NOTE — Assessment & Plan Note (Signed)
Severe strain vs possible avulsion fracture  - for ice tid, left ankle brace, pain control, avoid wt bearing until improved, for left ankle film, consider ortho if fx,

## 2015-04-24 ENCOUNTER — Encounter: Payer: Self-pay | Admitting: Internal Medicine

## 2015-04-24 ENCOUNTER — Encounter: Payer: Self-pay | Admitting: Emergency Medicine

## 2015-04-24 ENCOUNTER — Ambulatory Visit (INDEPENDENT_AMBULATORY_CARE_PROVIDER_SITE_OTHER): Payer: BLUE CROSS/BLUE SHIELD | Admitting: Internal Medicine

## 2015-04-24 VITALS — BP 112/72 | HR 98 | Temp 98.2°F | Resp 15 | Wt 162.8 lb

## 2015-04-24 DIAGNOSIS — J069 Acute upper respiratory infection, unspecified: Secondary | ICD-10-CM

## 2015-04-24 MED ORDER — AMOXICILLIN 500 MG PO CAPS: 500.0000 mg | ORAL_CAPSULE | Freq: Three times a day (TID) | ORAL | Status: DC

## 2015-04-24 NOTE — Patient Instructions (Signed)
Plain Mucinex (NOT D) for thick secretions ;force NON dairy fluids .   Nasal cleansing in the shower as discussed with lather of mild shampoo.After 10 seconds wash off lather while  exhaling through nostrils. Make sure that all residual soap is removed to prevent irritation.  Flonase OR Nasacort AQ 1 spray in each nostril twice a day as needed. Use the "crossover" technique into opposite nostril spraying toward opposite ear @ 45 degree angle, not straight up into nostril.  Plain Allegra (NOT D )  160 daily , Loratidine 10 mg , OR Zyrtec 10 mg @ bedtime  as needed for itchy eyes & sneezing.  Zicam Melts or Zinc lozenges as per package label for sore throat .  Complementary options to boost immunity include  vitamin C 2000 mg daily; & Echinacea for 4-7 days.  Fill the  prescription for antibiotic if fever; discolored nasal or chest secretions; or frontal headache or facial pain persist over 48-72 hours.  Please do not use Q-tips as this simply packs the wax down against he eardrum. Should wax build up occur, please put 2-3 drops of mineral oil in the ear at night and cover the canal with a  cotton ball.In the morning fill the canal with hydrogen peroxide & leave  for 10-15 minutes.Following this shower and use the thinnest washrag available to wick out the wax.

## 2015-04-24 NOTE — Progress Notes (Signed)
Pre visit review using our clinic review tool, if applicable. No additional management support is needed unless otherwise documented below in the visit note. 

## 2015-04-24 NOTE — Progress Notes (Signed)
   Subjective:    Patient ID: Caitlin Bryant, female    DOB: 07/28/1981, 34 y.o.   MRN: [id number]  HPI  Symptoms began 04/21/15 as sore throat with frontal and maxillary pressure discomfort. As of 04/22/15 the nasal secretions were yellow. As of 8/30 she had chills and diffuse arthralgias and myalgias. She did have some yellow sputum but only on 8/29. She does have itchy, watery eyes and sneezing. She has some fullness in the right ear.  Her 65 year old daughter was sick last week with a "cold". She also works in Audiological scientist.  She's never smoked. She has seasonal allergic rhinoconjunctivitis.   Review of Systems Dental pain or otic discharge denied. No fever or sweats.  There is no significant cough or sputum production, wheezing,or  paroxysmal nocturnal dyspnea.      Objective:   Physical Exam General appearance:Adequately nourished; no acute distress or increased work of breathing is present.    Lymphatic: No  lymphadenopathy about the head, neck, or axilla .  Eyes: No conjunctival inflammation or lid edema is present. There is no scleral icterus.  Ears:  External ear exam shows no significant lesions or deformities.  Otoscopic examination reveals wax impactions bilaterally. Nose:  External nasal examination shows no deformity or inflammation. Nasal mucosa are erythematous  without lesions or exudates No septal dislocation or deviation.No obstruction to airflow.   Oral exam: Dental hygiene is good; lips and gums are healthy appearing.There is no oropharyngeal erythema or exudate .  Neck:  No deformities, thyromegaly, masses, or tenderness noted.   Supple with full range of motion without pain.   Heart:  Normal rate and regular rhythm. S1 and S2 normal without gallop, murmur, click, rub or other extra sounds.   Lungs:Chest clear to auscultation; no wheezes, rhonchi,rales ,or rubs present.  Extremities:  No cyanosis, edema, or clubbing  noted    Skin: Warm & dry w/o tenting or  jaundice. No significant lesions or rash.         Assessment & Plan:  #1 upper respiratory infection ; most likely viral ,with pharyngitis Plan: See orders and recommendations

## 2015-04-25 ENCOUNTER — Ambulatory Visit: Payer: BLUE CROSS/BLUE SHIELD | Admitting: Internal Medicine

## 2015-05-10 ENCOUNTER — Encounter: Payer: Self-pay | Admitting: Internal Medicine

## 2015-05-10 ENCOUNTER — Ambulatory Visit (INDEPENDENT_AMBULATORY_CARE_PROVIDER_SITE_OTHER): Payer: BLUE CROSS/BLUE SHIELD | Admitting: Internal Medicine

## 2015-05-10 VITALS — BP 116/84 | HR 71 | Temp 98.5°F | Resp 16 | Wt 164.0 lb

## 2015-05-10 DIAGNOSIS — H6123 Impacted cerumen, bilateral: Secondary | ICD-10-CM

## 2015-05-10 DIAGNOSIS — G43009 Migraine without aura, not intractable, without status migrainosus: Secondary | ICD-10-CM

## 2015-05-10 DIAGNOSIS — G43909 Migraine, unspecified, not intractable, without status migrainosus: Secondary | ICD-10-CM | POA: Insufficient documentation

## 2015-05-10 MED ORDER — SUMATRIPTAN SUCCINATE 50 MG PO TABS: ORAL_TABLET | ORAL | Status: DC

## 2015-05-10 NOTE — Progress Notes (Signed)
   Subjective:    Patient ID: Caitlin Bryant, female    DOB: 1980-10-30, 34 y.o.   MRN: [id number]  HPI  She is here for migraines. These originally began 8 years ago after head trauma. She was pregnant with her second child & was involved in an accident which involved head trauma. One month after delivery  she began to have the headaches. These are normally 1-2 times per month and last several hours. There is a definite prodrome of feeling "yucky" followed several hours later by the migraine. The headaches are associated with sensitivity to light and sound ; dizziness; and nausea and vomiting. They typically are manifested as sharp pain behind the left eye. On 9/13 she had a diffuse headache rather than localized.  She does take Excedrin Migraine with a Coke with headache onset.This typically relieves headache but did not relieve the headache 2 days ago. She typically will "not feel well" for whole day after the migraines.  She feels that the most recent trigger may have been stress as she's been working overtime.  Review of Systems Fever, chills, sweats, or unexplained weight loss not present. Mental status change or memory loss denied. Blurred vision , diplopia or vision loss absent. Vertigo, near syncope or imbalance denied. There is no numbness, tingling, or weakness in extremities.   No loss of control of bladder or bowels. Radicular type pain absent.     Objective:   Physical Exam Neurologic exam : Cn 2-7 intact Strength equal & normal in upper & lower extremities Able to walk on heels and toes.   Balance normal  Romberg normal, finger to nose normal. Deep tendon reflexes are normal.  General appearance:Adequately nourished; no acute distress or increased work of breathing is present.    Lymphatic: No  lymphadenopathy about the head, neck, or axilla .  Eyes: No conjunctival inflammation or lid edema is present. There is no scleral icterus.  Ears:  External ear exam  shows no significant lesions or deformities. There is a significant collection of wax bilaterally  Nose:  External nasal examination shows no deformity or inflammation. Nasal mucosa are pink and moist without lesions or exudates No septal dislocation or deviation.No obstruction to airflow.   Oral exam: Dental hygiene is good; lips and gums are healthy appearing.There is no oropharyngeal erythema or exudate .  Neck:  No deformities, thyromegaly, masses, or tenderness noted.   Supple with full range of motion without pain.   Heart:  Normal rate and regular rhythm. S1 and S2 normal without gallop, murmur, click, rub or other extra sounds.   Lungs:Chest clear to auscultation; no wheezes, rhonchi,rales ,or rubs present.  Extremities:  No cyanosis, edema, or clubbing  noted    Skin: Warm & dry w/o tenting or jaundice. No significant lesions or rash.      Assessment & Plan:  #1 migraine variant with prodrome  #2 excess cerumen  See orders and after visit summary

## 2015-05-10 NOTE — Patient Instructions (Addendum)
Please keep a diary of your headaches . Document  each occurrence on the calendar with notation of : #1 any prodrome ( any non headache symptom such as marked fatigue,visual changes, ,etc ) which precedes actual headache ; #2) severity on 1-10 scale; #3) any triggers ( food/ drink,enviromenntal or weather changes ,physical or emotional stress) in 8-12 hour period prior to the headache; & #4) response to any medications or other intervention. Please review "Headache" @ [facility] for additional information.   Excedrin Migraine can be used to prevent migraine headaches if taken at the onset of any prodrome or non-headache symptom.  Please do not use Q-tips as we discussed. Should wax build up occur, please put 2-3 drops of mineral oil in the affected  ear at night to soften the wax .Cover the canal with a  cotton ball to prevent the oil from staining bed linens. In the morning fill the ear canal with hydrogen peroxide & lie in the opposite lateral decubitus position(on the side opposite the affected ear)  for 10-15 minutes. After allowing this period of time for the peroxide to dissolve the wax ;shower and use the thinnest washrag available to wick out the wax. If both ears are involved ; alternate this treatment from ear to ear each night until no wax is found on the washrag.

## 2015-05-10 NOTE — Progress Notes (Signed)
Pre visit review using our clinic review tool, if applicable. No additional management support is needed unless otherwise documented below in the visit note. 

## 2015-07-03 ENCOUNTER — Encounter: Payer: Self-pay | Admitting: Family

## 2015-07-03 ENCOUNTER — Ambulatory Visit (INDEPENDENT_AMBULATORY_CARE_PROVIDER_SITE_OTHER): Payer: BLUE CROSS/BLUE SHIELD | Admitting: Family

## 2015-07-03 VITALS — BP 130/86 | HR 96 | Temp 98.1°F | Resp 18 | Ht 63.0 in | Wt 157.0 lb

## 2015-07-03 DIAGNOSIS — M26609 Unspecified temporomandibular joint disorder, unspecified side: Secondary | ICD-10-CM | POA: Diagnosis not present

## 2015-07-03 MED ORDER — IBUPROFEN 800 MG PO TABS: 800.0000 mg | ORAL_TABLET | Freq: Three times a day (TID) | ORAL | Status: DC | PRN

## 2015-07-03 MED ORDER — CYCLOBENZAPRINE HCL 10 MG PO TABS: 5.0000 mg | ORAL_TABLET | Freq: Three times a day (TID) | ORAL | Status: DC | PRN

## 2015-07-03 NOTE — Progress Notes (Signed)
Pre visit review using our clinic review tool, if applicable. No additional management support is needed unless otherwise documented below in the visit note. 

## 2015-07-03 NOTE — Patient Instructions (Addendum)
Thank you for choosing [facility].  Summary/Instructions:  Su Caitlin DohenyWooi Teoh, MD, PA [address], [address] [place], [place] [address] [phone]  Your prescription(s) have been submitted to your pharmacy or been printed and provided for you. Please take as directed and contact our office if you believe you are having problem(s) with the medication(s) or have any questions.  If your symptoms worsen or fail to improve, please contact our office for further instruction, or in case of emergency go directly to the emergency room at the closest medical facility.    Temporomandibular Joint Syndrome Temporomandibular joint (TMJ) syndrome is a condition that affects the joints between your jaw and your skull. The TMJs are located near your ears and allow your jaw to open and close. These joints and the nearby muscles are involved in all movements of the jaw. People with TMJ syndrome have pain in the area of these joints and muscles. Chewing, biting, or other movements of the jaw can be difficult or painful. TMJ syndrome can be caused by various things. In many cases, the condition is mild and goes away within a few weeks. For some people, the condition can become a long-term problem. CAUSES Possible causes of TMJ syndrome include:  Grinding your teeth or clenching your jaw. Some people do this when they are under stress.  Arthritis.  Injury to the jaw.  Head or neck injury.  Teeth or dentures that are not aligned well. In some cases, the cause of TMJ syndrome may not be known. SIGNS AND SYMPTOMS The most common symptom is an aching pain on the side of the head in the area of the TMJ. Other symptoms may include:  Pain when moving your jaw, such as when chewing or biting.  Being unable to open your jaw all the way.  Making a clicking sound when you open your mouth.  Headache.  Earache.  Neck or shoulder pain. DIAGNOSIS Diagnosis can usually be made based on your symptoms, your  medical history, and a physical exam. Your health care provider may check the range of motion of your jaw. Imaging tests, such as X-rays or an MRI, are sometimes done. You may need to see your dentist to determine if your teeth and jaw are lined up correctly. TREATMENT TMJ syndrome often goes away on its own. If treatment is needed, the options may include:  Eating soft foods and applying ice or heat.  Medicines to relieve pain or inflammation.  Medicines to relax the muscles.  A splint, bite plate, or mouthpiece to prevent teeth grinding or jaw clenching.  Relaxation techniques or counseling to help reduce stress.  Transcutaneous electrical nerve stimulation (TENS). This helps to relieve pain by applying an electrical current through the skin.  Acupuncture. This is sometimes helpful to relieve pain.  Jaw surgery. This is rarely needed. HOME CARE INSTRUCTIONS  Take medicines only as directed by your health care provider.  Eat a soft diet if you are having trouble chewing.  Apply ice to the painful area.  Put ice in a plastic bag.  Place a towel between your skin and the bag.  Leave the ice on for 20 minutes, 2-3 times a day.  Apply a warm compress to the painful area as directed.  Massage your jaw area and perform any jaw stretching exercises as recommended by your health care provider.  If you were given a mouthpiece or bite plate, wear it as directed.  Avoid foods that require a lot of chewing.  Do not chew gum.  Keep all follow-up visits as directed by your health care provider. This is important. SEEK MEDICAL CARE IF:  You are having trouble eating.  You have new or worsening symptoms. SEEK IMMEDIATE MEDICAL CARE IF:  Your jaw locks open or closed.   This information is not intended to replace advice given to you by your health care provider. Make sure you discuss any questions you have with your health care provider.   Document Released: 05/05/2001 Document  Revised: 08/31/2014 Document Reviewed: 03/15/2014 Elsevier Interactive Patient Education [employer]

## 2015-07-03 NOTE — Assessment & Plan Note (Signed)
Symptoms and exam consistent with TMJ dysfunction and possible subluxation. Start ibuprofen and cyclobenzaprine as needed for inflammation and discomfort. Mechanical soft diet as tolerated. Refer to ENT for further evaluation and treatment.

## 2015-07-03 NOTE — Progress Notes (Signed)
Subjective:    Patient ID: Caitlin Bryant, female    DOB: [birth year], 34 y.o.   MRN: [id number]  Chief Complaint  Patient presents with  . Jaw Pain    Last friday morning she sneezed and her jaw popped and has been having problems with pain in her jaw and states that she hasnt been able to close her mouth, dizzy spells, ear pain and face pain on right side    HPI:  Caitlin Bryant is a 34 y.o. female who  has a past medical history of MRSA (methicillin resistant staph aureus) culture positive. and presents today for an acute office visit.   Associated symptom of pain and inability to close her mouth completely has been going on for about 5 days. The pain followed a sneeze when she felt a pop. Describes like her ear is swollen and closed with most of the pain located around the anterior aspect of her right ear. She has also had some dizzy spells. Modifying factors include ibuprofen and warm compresses which have not helped greatly. Husband describes some warmth to the area but that has decreased since onset. There is also pain with any kind movement.  Allergies  Allergen Reactions  . Sulfa Antibiotics Rash     Current Outpatient Prescriptions on File Prior to Visit  Medication Sig Dispense Refill  . ORSYTHIA 0.1-20 MG-MCG tablet Take 1 tablet by mouth daily.   1  . PARoxetine (PAXIL) 10 MG tablet Take 1 tablet (10 mg total) by mouth daily. (Patient taking differently: Take 10 mg by mouth daily as needed. ) 30 tablet 1  . SUMAtriptan (IMITREX) 50 MG tablet May repeat in 2 hours if headache persists or recurs. 10 tablet 0   No current facility-administered medications on file prior to visit.    Past Medical History  Diagnosis Date  . MRSA (methicillin resistant staph aureus) culture positive     Review of Systems  Constitutional: Negative for fever and chills.  HENT:       Positive for jaw pain  Neurological: Positive for dizziness and headaches.      Objective:    BP 130/86 mmHg  Pulse 96  Temp(Src) 98.1 F (36.7 C) (Oral)  Resp 18  Ht 5\' 3"  (1.6 m)  Wt 157 lb (71.215 kg)  BMI 27.82 kg/m2  SpO2 97% Nursing note and vital signs reviewed.  Physical Exam  Constitutional: She is oriented to person, place, and time. She appears well-developed and well-nourished. No distress.  HENT:  Right Ear: Hearing, tympanic membrane, external ear and ear canal normal.  Left Ear: Hearing, tympanic membrane, external ear and ear canal normal.  There is mild malalignment of teeth and inability to fully close her mouth. No obvious deformity, discoloration or edema of jaw. There is mild tenderness around the TMJ.  Cardiovascular: Normal rate, regular rhythm, normal heart sounds and intact distal pulses.   Pulmonary/Chest: Effort normal and breath sounds normal.  Neurological: She is alert and oriented to person, place, and time.  Skin: Skin is warm and dry.  Psychiatric: She has a normal mood and affect. Her behavior is normal. Judgment and thought content normal.       Assessment & Plan:   Problem List Items Addressed This Visit      Musculoskeletal and Integument   TMJ dysfunction - Primary    Symptoms and exam consistent with TMJ dysfunction and possible subluxation. Start ibuprofen and cyclobenzaprine as needed for inflammation and discomfort. Mechanical soft  diet as tolerated. Refer to ENT for further evaluation and treatment.       Relevant Medications   cyclobenzaprine (FLEXERIL) 10 MG tablet   ibuprofen (ADVIL,MOTRIN) 800 MG tablet   Other Relevant Orders   Ambulatory referral to ENT

## 2015-07-24 ENCOUNTER — Encounter: Payer: Self-pay | Admitting: Internal Medicine

## 2015-07-24 ENCOUNTER — Ambulatory Visit (INDEPENDENT_AMBULATORY_CARE_PROVIDER_SITE_OTHER): Payer: BLUE CROSS/BLUE SHIELD | Admitting: Internal Medicine

## 2015-07-24 VITALS — BP 126/82 | HR 96 | Temp 98.4°F | Ht 64.0 in | Wt 168.0 lb

## 2015-07-24 DIAGNOSIS — K1379 Other lesions of oral mucosa: Secondary | ICD-10-CM | POA: Diagnosis not present

## 2015-07-24 DIAGNOSIS — F329 Major depressive disorder, single episode, unspecified: Secondary | ICD-10-CM

## 2015-07-24 DIAGNOSIS — F411 Generalized anxiety disorder: Secondary | ICD-10-CM | POA: Diagnosis not present

## 2015-07-24 DIAGNOSIS — F32A Depression, unspecified: Secondary | ICD-10-CM | POA: Insufficient documentation

## 2015-07-24 MED ORDER — TRIAMCINOLONE ACETONIDE 0.1 % MT PSTE
1.0000 | PASTE | Freq: Two times a day (BID) | OROMUCOSAL | Status: DC
Start: 2015-07-24 — End: 2016-07-14

## 2015-07-24 NOTE — Progress Notes (Signed)
Subjective:    Patient ID: Caitlin DuffelConstance D Gambrel, female    DOB: 10-05-1980, 34 y.o.   MRN: [id number]  HPI  Here with 1 wk onset multiple sore areas to the upper and lower inner lips, without fever, trauma, does not wear dental appliance; pain mild and one or two sores have resolved already but she is just wondering if might be canker sores, though has never that before,  No hx of similar mouth sores in the past.  No sores to the inner cheeks or tongue.  No hx of syphilis or other std.  No fever  Monogamous.  Also with 1-2 mo worsening stress and anxiety symptoms with episodes of tearfulness, helped by boyfriend but keeps happening.  No SI or HI, sister and mother both being tx for bipolar. Past Medical History  Diagnosis Date  . MRSA (methicillin resistant staph aureus) culture positive    Past Surgical History  Procedure Laterality Date  . Cesarean section  2004 and 2008    x 2    reports that she has never smoked. She does not have any smokeless tobacco history on file. She reports that she does not drink alcohol or use illicit drugs. family history includes Diabetes in her mother; Fibromyalgia in her mother; Heart attack in her father; Hypertension in her mother; Osteoarthritis in her mother. Allergies  Allergen Reactions  . Sulfa Antibiotics Rash   Current Outpatient Prescriptions on File Prior to Visit  Medication Sig Dispense Refill  . ibuprofen (ADVIL,MOTRIN) 800 MG tablet Take 1 tablet (800 mg total) by mouth every 8 (eight) hours as needed. 60 tablet 0  . ORSYTHIA 0.1-20 MG-MCG tablet Take 1 tablet by mouth daily.   1  . SUMAtriptan (IMITREX) 50 MG tablet May repeat in 2 hours if headache persists or recurs. 10 tablet 0  . cyclobenzaprine (FLEXERIL) 10 MG tablet Take 0.5-1 tablets (5-10 mg total) by mouth 3 (three) times daily as needed for muscle spasms. (Patient not taking: Reported on 07/24/2015) 30 tablet 0  . PARoxetine (PAXIL) 10 MG tablet Take 1 tablet (10 mg total) by  mouth daily. (Patient not taking: Reported on 07/24/2015) 30 tablet 1   No current facility-administered medications on file prior to visit.   Review of Systems  Constitutional: Negative for unusual diaphoresis or night sweats HENT: Negative for ringing in ear or discharge Eyes: Negative for double vision or worsening visual disturbance.  Respiratory: Negative for choking and stridor.   Gastrointestinal: Negative for vomiting or other signifcant bowel change Genitourinary: Negative for hematuria or change in urine volume.  Musculoskeletal: Negative for other MSK pain or swelling Skin: Negative for color change and worsening wound.  Neurological: Negative for tremors and numbness other than noted  Psychiatric/Behavioral: Negative for decreased concentration or agitation other than above       Objective:   Physical Exam BP 126/82 mmHg  Pulse 96  Temp(Src) 98.4 F (36.9 C) (Oral)  Ht 5\' 4"  (1.626 m)  Wt 168 lb (76.204 kg)  BMI 28.82 kg/m2  SpO2 98% VS noted,  Constitutional: Pt appears in no significant distress HENT: Head: NCAT.  Right Ear: External ear normal.  Left Ear: External ear normal.  Mouth with 4 distinct (1 upper, 3 lower) inner lips apthous appearing small shallow ulcerations approx 5 -7 mm on erythem bases Eyes: . Pupils are equal, round, and reactive to light. Conjunctivae and EOM are normal Neck: Normal range of motion. Neck supple.  Cardiovascular: Normal rate and regular rhythm.  Pulmonary/Chest: Effort normal and breath sounds without rales or wheezing.  Neurological: Pt is alert. Not confused , motor grossly intact Skin: Skin is warm. No rash, no LE edema Psychiatric: Pt behavior is normal. No agitation. mild dysphoric but not manic     Assessment & Plan:

## 2015-07-24 NOTE — Assessment & Plan Note (Signed)
Pt has stopped her paxil due to percieved risk of wt gain, will accept counseling - will refer

## 2015-07-24 NOTE — Assessment & Plan Note (Signed)
D/w pt, not clear to me she is bipolar but would consider starting lexapro 10 qd, but declines for now

## 2015-07-24 NOTE — Progress Notes (Signed)
Pre visit review using our clinic review tool, if applicable. No additional management support is needed unless otherwise documented below in the visit note. 

## 2015-07-24 NOTE — Patient Instructions (Signed)
Please take all new medication as prescribed for what appear to be apthous ulcers - the kenalog in orabase  You will be contacted regarding the referral for: counseling  Please call or return to Dr Okey Duprerawford for treatment such as lexapro for the stress and tearfulness  Please continue all other medications as before, and refills have been done if requested.  Please have the pharmacy call with any other refills you may need.  Please keep your appointments with your specialists as you may have planned

## 2015-07-24 NOTE — Assessment & Plan Note (Signed)
Likely c/w apthous ulcerations of unknown but likely benign etiology - for kenalog in orabase, ok to hold on further rheum eval for now, but consider such as ANA for recurrence

## 2015-07-25 ENCOUNTER — Ambulatory Visit (INDEPENDENT_AMBULATORY_CARE_PROVIDER_SITE_OTHER): Payer: BLUE CROSS/BLUE SHIELD | Admitting: Internal Medicine

## 2015-07-25 ENCOUNTER — Encounter: Payer: Self-pay | Admitting: Internal Medicine

## 2015-07-25 VITALS — BP 132/82 | HR 99 | Temp 97.9°F | Resp 16 | Ht 64.0 in | Wt 165.0 lb

## 2015-07-25 DIAGNOSIS — G43009 Migraine without aura, not intractable, without status migrainosus: Secondary | ICD-10-CM

## 2015-07-25 MED ORDER — BUTALBITAL-ASPIRIN-CAFFEINE 50-325-40 MG PO CAPS: 1.0000 | ORAL_CAPSULE | Freq: Two times a day (BID) | ORAL | Status: DC | PRN

## 2015-07-25 MED ORDER — ONDANSETRON HCL 4 MG PO TABS: 4.0000 mg | ORAL_TABLET | Freq: Three times a day (TID) | ORAL | Status: DC | PRN

## 2015-07-25 NOTE — Progress Notes (Signed)
Pre visit review using our clinic review tool, if applicable. No additional management support is needed unless otherwise documented below in the visit note. 

## 2015-07-25 NOTE — Progress Notes (Signed)
   Subjective:    Patient ID: Caitlin DuffelConstance D Bahe, female    DOB: [birth year], 34 y.o.   MRN: [id number]  HPI The patient is a 34 YO female coming in today for adverse medication reaction. She had taken imitrex last night for migraine. She got severe nausea and vomiting and body aches. She had taken it once before and got some vomiting which she attributed to the migraine. She is still feeling somewhat nauseous this morning but has not thrown up. Denies fevers or chills. No diarrhea or constipation. No new foods and did not eat out last night.   Review of Systems  Constitutional: Positive for fatigue. Negative for fever, activity change and appetite change.  HENT:       TMJ  Eyes: Negative.   Respiratory: Negative.   Cardiovascular: Negative.   Gastrointestinal: Positive for nausea and vomiting. Negative for diarrhea, constipation, blood in stool, abdominal distention and anal bleeding.  Musculoskeletal: Negative.   Skin: Negative.   Neurological: Negative.       Objective:   Physical Exam  Constitutional: She is oriented to person, place, and time. She appears well-developed and well-nourished.  HENT:  Head: Normocephalic and atraumatic.  Eyes: EOM are normal.  Neck: Normal range of motion.  Cardiovascular: Normal rate and regular rhythm.   Pulmonary/Chest: Effort normal and breath sounds normal. No respiratory distress. She has no wheezes.  Abdominal: Soft. Bowel sounds are normal. She exhibits no distension. There is no tenderness.  Musculoskeletal: She exhibits no edema.  Neurological: She is alert and oriented to person, place, and time. Coordination normal.  Skin: Skin is warm and dry.   Filed Vitals:   07/25/15 1121  BP: 132/82  Pulse: 99  Temp: 97.9 F (36.6 C)  TempSrc: Oral  Resp: 16  Height: 5\' 4"  (1.626 m)  Weight: 165 lb (74.844 kg)  SpO2: 99%      Assessment & Plan:

## 2015-07-25 NOTE — Patient Instructions (Signed)
We will list that medicine in your allergies so you do not get it again.   We have given you a different kind of medicine for migraines when needed.   We have also sent in zofran for nausea which you may want to take before you leave tonight.

## 2015-07-25 NOTE — Assessment & Plan Note (Addendum)
Rx for fiorinal given at visit. Added sumatriptan to her list of allergies. Rx for zofran sent in for nausea.

## 2015-09-24 ENCOUNTER — Ambulatory Visit (INDEPENDENT_AMBULATORY_CARE_PROVIDER_SITE_OTHER): Payer: BLUE CROSS/BLUE SHIELD | Admitting: Family Medicine

## 2015-09-24 ENCOUNTER — Encounter: Payer: Self-pay | Admitting: Family Medicine

## 2015-09-24 VITALS — BP 102/80 | HR 96 | Temp 97.9°F | Ht 64.0 in | Wt 165.2 lb

## 2015-09-24 DIAGNOSIS — J069 Acute upper respiratory infection, unspecified: Secondary | ICD-10-CM | POA: Diagnosis not present

## 2015-09-24 DIAGNOSIS — B9789 Other viral agents as the cause of diseases classified elsewhere: Principal | ICD-10-CM

## 2015-09-24 MED ORDER — BENZONATATE 200 MG PO CAPS: 200.0000 mg | ORAL_CAPSULE | Freq: Three times a day (TID) | ORAL | Status: DC | PRN

## 2015-09-24 NOTE — Progress Notes (Signed)
   Subjective:    Patient ID: Caitlin Bryant, female    DOB: [birth year], 35 y.o.   MRN: [id number]  HPI Patient seen as a work in. 2 day history of cough, nasal congestion, body aches, sore throat, chills, and low-grade fever. Denies any nausea, vomiting, or diarrhea. She's taken some ibuprofen which helped with her body aches and fever. She works in a daycare. Nonsmoker.  Past Medical History  Diagnosis Date  . MRSA (methicillin resistant staph aureus) culture positive    Past Surgical History  Procedure Laterality Date  . Cesarean section  2004 and 2008    x 2    reports that she has never smoked. She does not have any smokeless tobacco history on file. She reports that she does not drink alcohol or use illicit drugs. family history includes Diabetes in her mother; Fibromyalgia in her mother; Heart attack in her father; Hypertension in her mother; Osteoarthritis in her mother. Allergies  Allergen Reactions  . Sumatriptan Nausea And Vomiting    Severe nausea and vomiting  . Sulfa Antibiotics Rash      Review of Systems  Constitutional: Positive for fever, chills and fatigue.  HENT: Positive for congestion and sore throat.   Respiratory: Positive for cough.   Cardiovascular: Negative for chest pain.  Skin: Negative for rash.       Objective:   Physical Exam  Constitutional: She appears well-developed and well-nourished.  HENT:  Cerumen bilaterally. OP- prior tonsillectomy.  Minimal erythema with no exudate.  Neck: Neck supple.  Cardiovascular: Normal rate and regular rhythm.   Pulmonary/Chest: Effort normal and breath sounds normal. No respiratory distress. She has no wheezes. She has no rales.  Lymphadenopathy:    She has no cervical adenopathy.  Skin: No rash noted.          Assessment & Plan:  Viral URI with cough.  Tessalon perles 200 mg po q 8 hours prn.  continue Ibuprofen as needed.

## 2015-09-24 NOTE — Progress Notes (Signed)
Pre visit review using our clinic review tool, if applicable. No additional management support is needed unless otherwise documented below in the visit note. 

## 2015-09-24 NOTE — Patient Instructions (Signed)
Upper Respiratory Infection, Adult Most upper respiratory infections (URIs) are a viral infection of the air passages leading to the lungs. A URI affects the nose, throat, and upper air passages. The most common type of URI is nasopharyngitis and is typically referred to as "the common cold." URIs run their course and usually go away on their own. Most of the time, a URI does not require medical attention, but sometimes a bacterial infection in the upper airways can follow a viral infection. This is called a secondary infection. Sinus and middle ear infections are common types of secondary upper respiratory infections. Bacterial pneumonia can also complicate a URI. A URI can worsen asthma and chronic obstructive pulmonary disease (COPD). Sometimes, these complications can require emergency medical care and may be life threatening.  CAUSES Almost all URIs are caused by viruses. A virus is a type of germ and can spread from one person to another.  RISKS FACTORS You may be at risk for a URI if:   You smoke.   You have chronic heart or lung disease.  You have a weakened defense (immune) system.   You are very young or very old.   You have nasal allergies or asthma.  You work in crowded or poorly ventilated areas.  You work in health care facilities or schools. SIGNS AND SYMPTOMS  Symptoms typically develop 2-3 days after you come in contact with a cold virus. Most viral URIs last 7-10 days. However, viral URIs from the influenza virus (flu virus) can last 14-18 days and are typically more severe. Symptoms may include:   Runny or stuffy (congested) nose.   Sneezing.   Cough.   Sore throat.   Headache.   Fatigue.   Fever.   Loss of appetite.   Pain in your forehead, behind your eyes, and over your cheekbones (sinus pain).  Muscle aches.  DIAGNOSIS  Your health care provider may diagnose a URI by:  Physical exam.  Tests to check that your symptoms are not due to  another condition such as:  Strep throat.  Sinusitis.  Pneumonia.  Asthma. TREATMENT  A URI goes away on its own with time. It cannot be cured with medicines, but medicines may be prescribed or recommended to relieve symptoms. Medicines may help:  Reduce your fever.  Reduce your cough.  Relieve nasal congestion. HOME CARE INSTRUCTIONS   Take medicines only as directed by your health care provider.   Gargle warm saltwater or take cough drops to comfort your throat as directed by your health care provider.  Use a warm mist humidifier or inhale steam from a shower to increase air moisture. This may make it easier to breathe.  Drink enough fluid to keep your urine clear or pale yellow.   Eat soups and other clear broths and maintain good nutrition.   Rest as needed.   Return to work when your temperature has returned to normal or as your health care provider advises. You may need to stay home longer to avoid infecting others. You can also use a face mask and careful hand washing to prevent spread of the virus.  Increase the usage of your inhaler if you have asthma.   Do not use any tobacco products, including cigarettes, chewing tobacco, or electronic cigarettes. If you need help quitting, ask your health care provider. PREVENTION  The best way to protect yourself from getting a cold is to practice good hygiene.   Avoid oral or hand contact with people with cold   symptoms.   Wash your hands often if contact occurs.  There is no clear evidence that vitamin C, vitamin E, echinacea, or exercise reduces the chance of developing a cold. However, it is always recommended to get plenty of rest, exercise, and practice good nutrition.  SEEK MEDICAL CARE IF:   You are getting worse rather than better.   Your symptoms are not controlled by medicine.   You have chills.  You have worsening shortness of breath.  You have brown or red mucus.  You have yellow or brown nasal  discharge.  You have pain in your face, especially when you bend forward.  You have a fever.  You have swollen neck glands.  You have pain while swallowing.  You have white areas in the back of your throat. SEEK IMMEDIATE MEDICAL CARE IF:   You have severe or persistent:  Headache.  Ear pain.  Sinus pain.  Chest pain.  You have chronic lung disease and any of the following:  Wheezing.  Prolonged cough.  Coughing up blood.  A change in your usual mucus.  You have a stiff neck.  You have changes in your:  Vision.  Hearing.  Thinking.  Mood. MAKE SURE YOU:   Understand these instructions.  Will watch your condition.  Will get help right away if you are not doing well or get worse.   This information is not intended to replace advice given to you by your health care provider. Make sure you discuss any questions you have with your health care provider.   Document Released: 02/03/2001 Document Revised: 12/25/2014 Document Reviewed: 11/15/2013 Elsevier Interactive Patient Education 2016 [employer]  

## 2016-07-14 ENCOUNTER — Ambulatory Visit (INDEPENDENT_AMBULATORY_CARE_PROVIDER_SITE_OTHER): Payer: BLUE CROSS/BLUE SHIELD | Admitting: Internal Medicine

## 2016-07-14 ENCOUNTER — Encounter: Payer: Self-pay | Admitting: Internal Medicine

## 2016-07-14 DIAGNOSIS — J302 Other seasonal allergic rhinitis: Secondary | ICD-10-CM | POA: Diagnosis not present

## 2016-07-14 MED ORDER — FLUTICASONE PROPIONATE 50 MCG/ACT NA SUSP: 2.0000 | Freq: Every day | NASAL | 6 refills | Status: DC

## 2016-07-14 MED ORDER — BENZONATATE 200 MG PO CAPS: 200.0000 mg | ORAL_CAPSULE | Freq: Three times a day (TID) | ORAL | 0 refills | Status: DC | PRN

## 2016-07-14 MED ORDER — TRIAMCINOLONE ACETONIDE 0.1 % EX CREA: 1.0000 "application " | TOPICAL_CREAM | Freq: Two times a day (BID) | CUTANEOUS | 2 refills | Status: DC

## 2016-07-14 NOTE — Progress Notes (Signed)
Pre visit review using our clinic review tool, if applicable. No additional management support is needed unless otherwise documented below in the visit note. 

## 2016-07-14 NOTE — Patient Instructions (Signed)
We have sent in the tesalon perles for cough that you can use 3 times a day as needed. They are non-drowsy.   We have also sent in flonase which is a nose spray that you use 2 sprays in each nostril daily for the next 1-2 weeks.   We have sent in the cream for your hands that you can use twice a day as needed.

## 2016-07-14 NOTE — Assessment & Plan Note (Signed)
No infection on exam today, no indication for steroids or antibiotics today. Rx for tessalon perles for cough and also flonase for the allergic drainage.

## 2016-07-14 NOTE — Progress Notes (Signed)
   Subjective:    Patient ID: Caitlin Bryant, female    DOB: 1981/05/02, 35 y.o.   MRN: [id number]  HPI The patient is a 35 YO female coming in for follow up of urgent care visit for bronchitis. She was given azithromycin and prednisone which was helpful for some of her symptoms. She is still coughing some and wants to make sure she is okay. She denies fevers or chills now. She is having some nasal drainage and is not taking anything for those symptoms. No headaches or ear pain.   Review of Systems  Constitutional: Negative for activity change, appetite change, fatigue, fever and unexpected weight change.  HENT: Positive for congestion and rhinorrhea. Negative for ear discharge, ear pain, postnasal drip, sinus pain, sinus pressure, sore throat and trouble swallowing.   Eyes: Negative.   Respiratory: Positive for cough. Negative for chest tightness, shortness of breath and wheezing.   Cardiovascular: Negative.   Gastrointestinal: Negative.   Musculoskeletal: Negative.       Objective:   Physical Exam  Constitutional: She is oriented to person, place, and time. She appears well-developed and well-nourished.  HENT:  Head: Normocephalic and atraumatic.  Ears with wax, TM normal, oropharynx with redness and clear drainage, no nasal crusting.   Eyes: EOM are normal.  Neck: Normal range of motion.  Cardiovascular: Normal rate and regular rhythm.   Pulmonary/Chest: Effort normal and breath sounds normal. No respiratory distress. She has no wheezes. She has no rales.  Abdominal: Soft. She exhibits no distension. There is no tenderness.  Neurological: She is alert and oriented to person, place, and time. Coordination normal.  Skin: Skin is warm and dry.   Vitals:   07/14/16 0921  BP: 122/86  Pulse: 98  Resp: 16  Temp: 98 F (36.7 C)  TempSrc: Oral  SpO2: 98%  Weight: 168 lb (76.2 kg)  Height: 5\' 3"  (1.6 m)      Assessment & Plan:

## 2016-08-05 ENCOUNTER — Ambulatory Visit: Payer: BLUE CROSS/BLUE SHIELD | Admitting: Internal Medicine

## 2016-08-07 ENCOUNTER — Ambulatory Visit (INDEPENDENT_AMBULATORY_CARE_PROVIDER_SITE_OTHER): Payer: BLUE CROSS/BLUE SHIELD | Admitting: Internal Medicine

## 2016-08-07 ENCOUNTER — Encounter: Payer: Self-pay | Admitting: Internal Medicine

## 2016-08-07 VITALS — BP 102/60 | HR 83 | Temp 97.4°F | Resp 18 | Ht 64.0 in | Wt 168.8 lb

## 2016-08-07 DIAGNOSIS — R221 Localized swelling, mass and lump, neck: Secondary | ICD-10-CM

## 2016-08-07 MED ORDER — DICLOFENAC SODIUM 1 % TD GEL: 2.0000 g | Freq: Four times a day (QID) | TRANSDERMAL | 3 refills | Status: DC

## 2016-08-07 NOTE — Progress Notes (Signed)
   Subjective:    Patient ID: Caitlin Bryant, female    DOB: 1981/02/07, 35 y.o.   MRN: [id number]  HPI The patient is a 35 YO female coming in for knot on her shoulder/neck area. She first noticed it about 2 weeks ago. It has grown slightly since that time. She is having pain from it which is hurting in the area and also down into her side and up in the neck. She denies fevers or chills. Did have some cold about 1 month ago but fully healed now. Started as a Multimedia programmermarble and now is more diffuse. Seems to swell during the day and send out shooting pains. No weight change.    Review of Systems  Constitutional: Negative for activity change, appetite change, fatigue, fever and unexpected weight change.  HENT: Negative for congestion, ear discharge, ear pain, postnasal drip, rhinorrhea, sinus pain, sinus pressure, sore throat and trouble swallowing.   Eyes: Negative.   Respiratory: Negative.   Cardiovascular: Negative.   Gastrointestinal: Negative.   Musculoskeletal: Positive for myalgias and neck pain.  Skin: Negative.   Neurological: Negative.       Objective:   Physical Exam  Constitutional: She is oriented to person, place, and time. She appears well-developed and well-nourished.  HENT:  Head: Normocephalic and atraumatic.  Right Ear: External ear normal.  Left Ear: External ear normal.  Nose: Nose normal.  Mouth/Throat: Oropharynx is clear and moist.  Eyes: EOM are normal.  Neck: Normal range of motion. No thyromegaly present.  Some soft tissue swelling in the left neck/shoulder region which is mostly soft tissue feeling, no mass or lymph node prominent.   Cardiovascular: Normal rate and regular rhythm.   Pulmonary/Chest: Effort normal and breath sounds normal.  Abdominal: Soft. She exhibits no distension. There is no tenderness.  Lymphadenopathy:    She has no cervical adenopathy.  Neurological: She is alert and oriented to person, place, and time.  Skin: Skin is warm and dry.    Vitals:   08/07/16 1311  BP: 102/60  Pulse: 83  Resp: 18  Temp: 97.4 F (36.3 C)  TempSrc: Oral  SpO2: 99%  Weight: 168 lb 12.8 oz (76.6 kg)  Height: 5\' 4"  (1.626 m)      Assessment & Plan:

## 2016-08-07 NOTE — Progress Notes (Signed)
Pre visit review using our clinic review tool, if applicable. No additional management support is needed unless otherwise documented below in the visit note. 

## 2016-08-07 NOTE — Assessment & Plan Note (Signed)
Checking US neck to check area of swelling. Likely reactive in nature to past sinus infection. Rx for voltaren gel for pain.

## 2016-08-07 NOTE — Patient Instructions (Signed)
We are sending in the voltaren gel which you can use on the area up to 3 times a day.   We are going to get the ultrasound of the neck to make sure this is just a lymph node.

## 2016-08-14 ENCOUNTER — Ambulatory Visit
Admission: RE | Admit: 2016-08-14 | Discharge: 2016-08-14 | Disposition: A | Discharge status: Home / Self Care | Payer: BLUE CROSS/BLUE SHIELD | Source: Ambulatory Visit | Attending: Internal Medicine | Admitting: Internal Medicine

## 2016-08-14 ENCOUNTER — Telehealth: Payer: Self-pay | Admitting: Geriatric Medicine

## 2016-08-14 DIAGNOSIS — R221 Localized swelling, mass and lump, neck: Secondary | ICD-10-CM

## 2016-08-14 NOTE — Telephone Encounter (Signed)
Voltaren Gel was not approved for shoulder pain. Is there something you can send in as a replacement? Please advise, thanks.

## 2016-08-18 ENCOUNTER — Telehealth: Payer: Self-pay | Admitting: Internal Medicine

## 2016-08-18 NOTE — Telephone Encounter (Signed)
Contacted patient with MD response.  Patient stated understanding.

## 2016-08-18 NOTE — Telephone Encounter (Signed)
She can take ibuprofen or tylenol for the pain if it is still there.

## 2016-08-18 NOTE — Telephone Encounter (Signed)
Pt called stating someone call about her ultrasound. Please call her back, do not see any note.

## 2016-08-18 NOTE — Telephone Encounter (Signed)
Left message that ultrasound results are normal--call back with any questions

## 2016-09-25 ENCOUNTER — Encounter: Payer: Self-pay | Admitting: Internal Medicine

## 2016-09-25 ENCOUNTER — Ambulatory Visit (INDEPENDENT_AMBULATORY_CARE_PROVIDER_SITE_OTHER): Payer: BLUE CROSS/BLUE SHIELD | Admitting: Internal Medicine

## 2016-09-25 VITALS — BP 124/82 | HR 77 | Temp 98.1°F | Ht 64.0 in | Wt 170.0 lb

## 2016-09-25 DIAGNOSIS — B9789 Other viral agents as the cause of diseases classified elsewhere: Secondary | ICD-10-CM | POA: Insufficient documentation

## 2016-09-25 DIAGNOSIS — J029 Acute pharyngitis, unspecified: Secondary | ICD-10-CM | POA: Diagnosis not present

## 2016-09-25 DIAGNOSIS — J028 Acute pharyngitis due to other specified organisms: Secondary | ICD-10-CM

## 2016-09-25 LAB — POCT RAPID STREP A (OFFICE): Rapid Strep A Screen: NEGATIVE

## 2016-09-25 MED ORDER — LIDOCAINE VISCOUS 2 % MT SOLN: 20.0000 mL | OROMUCOSAL | 0 refills | Status: DC | PRN

## 2016-09-25 NOTE — Assessment & Plan Note (Signed)
Can use ibuprofen for pain and rx for lidocaine viscous to use for topical pain relief. Rapid strep done in the office which was negative. No indication for antibiotics which was explained to her in the office.

## 2016-09-25 NOTE — Progress Notes (Signed)
Pre visit review using our clinic review tool, if applicable. No additional management support is needed unless otherwise documented below in the visit note. 

## 2016-09-25 NOTE — Patient Instructions (Signed)
You do likely have a viral strep throat. This generally goes away on its own.  You can keep taking the flonase to help with drainage. You can use hot tea with honey to help with pain. We have sent in a medicine called lidocaine that you can swish and swallow to help with throat pain.

## 2016-09-25 NOTE — Progress Notes (Signed)
   Subjective:    Patient ID: Caitlin Bryant, female    DOB: 01-Dec-1980, 36 y.o.   MRN: [id number]  HPI The patient is a 36 YO female coming in for sore throat. Going on for about 1 week. She denies fevers or chills with it. Some minor nasal congestion which is not significant. She is taking flonase for this with good success. She is having no cough and no SOB. No muscle aches. Some headaches. 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 Did not get flu shot.   Review of Systems  Constitutional: Positive for activity change and fatigue. Negative for appetite change, chills, fever and unexpected weight change.  HENT: Positive for congestion, rhinorrhea and sore throat. Negative for ear discharge, ear pain, nosebleeds, postnasal drip, sinus pain, sinus pressure, trouble swallowing and voice change.   Eyes: Negative.   Respiratory: Negative.   Cardiovascular: Negative.   Musculoskeletal: Negative.   Neurological: Positive for headaches.      Objective:   Physical Exam  Constitutional: She is oriented to person, place, and time. She appears well-developed and well-nourished.  HENT:  Head: Normocephalic and atraumatic.  Right Ear: External ear normal.  Left Ear: External ear normal.  Oropharynx with redness and some white plaque on the sides of the oropharynx, nose without crusting, no sinus pressure.   Eyes: EOM are normal.  Neck: Normal range of motion.  Some shotty LAD  Cardiovascular: Normal rate and regular rhythm.   Pulmonary/Chest: Effort normal and breath sounds normal. No respiratory distress. She has no wheezes. She has no rales.  Abdominal: Soft. She exhibits no distension. There is no tenderness. There is no rebound.  Lymphadenopathy:    She has cervical adenopathy.  Neurological: She is alert and oriented to person, place, and time.  Skin: Skin is warm and dry.   Vitals:   09/25/16 1044  BP: 124/82  Pulse: 77  Temp: 98.1 F (36.7 C)  TempSrc: Oral  SpO2: 100%    Weight: 170 lb (77.1 kg)  Height: 5\' 4"  (1.626 m)      Assessment & Plan:

## 2016-10-09 ENCOUNTER — Encounter: Payer: Self-pay | Admitting: Family

## 2016-10-09 ENCOUNTER — Ambulatory Visit (INDEPENDENT_AMBULATORY_CARE_PROVIDER_SITE_OTHER): Payer: BLUE CROSS/BLUE SHIELD | Admitting: Family

## 2016-10-09 VITALS — BP 124/84 | HR 106 | Temp 97.9°F | Resp 16 | Ht 64.0 in | Wt 170.0 lb

## 2016-10-09 DIAGNOSIS — J0140 Acute pansinusitis, unspecified: Secondary | ICD-10-CM | POA: Insufficient documentation

## 2016-10-09 MED ORDER — AMOXICILLIN-POT CLAVULANATE 875-125 MG PO TABS: 1.0000 | ORAL_TABLET | Freq: Two times a day (BID) | ORAL | 0 refills | Status: DC

## 2016-10-09 NOTE — Progress Notes (Signed)
Subjective:    Patient ID: Caitlin Bryant, female    DOB: 28-May-1981, 36 y.o.   MRN: [id number]  Chief Complaint  Patient presents with  . Cough    x2 weeks, cough, congestion, headache, sinus pressure    HPI:  Caitlin DuffelConstance D Leighty is a 36 y.o. female who  has a past medical history of MRSA (methicillin resistant staph aureus) culture positive. and presents today for an acute office visit.  This is a new problem. Associated symptoms of cough, congestion, headache and sinus pressure has been going on for about 2 weeks. No fevers. Modifying factors include Excedrine which helped with body aches.  . Overall course of symptoms is worse since initial onset. Timing of the symptoms is generally constant. Severity of the cough is enough to disturb her sleep.   Allergies  Allergen Reactions  . Sumatriptan Nausea And Vomiting    Severe nausea and vomiting  . Sulfa Antibiotics Rash      Outpatient Medications Prior to Visit  Medication Sig Dispense Refill  . diclofenac sodium (VOLTAREN) 1 % GEL Apply 2 g topically 4 (four) times daily. 100 g 3  . fluticasone (FLONASE) 50 MCG/ACT nasal spray Place 2 sprays into both nostrils daily. 16 g 6  . ibuprofen (ADVIL,MOTRIN) 800 MG tablet Take 1 tablet (800 mg total) by mouth every 8 (eight) hours as needed. 60 tablet 0  . lidocaine (XYLOCAINE) 2 % solution Use as directed 20 mLs in the mouth or throat as needed for mouth pain. 100 mL 0   No facility-administered medications prior to visit.     Review of Systems  Constitutional: Negative for chills and fever.  HENT: Positive for congestion, ear pain, sinus pain and sinus pressure. Negative for sore throat.   Respiratory: Positive for cough. Negative for chest tightness and shortness of breath.       Objective:    BP 124/84 (BP Location: Left Arm, Patient Position: Sitting, Cuff Size: Normal)   Pulse (!) 106   Temp 97.9 F (36.6 C) (Oral)   Resp 16   Ht 5\' 4"  (1.626 m)   Wt 170 lb  (77.1 kg)   LMP 09/24/2016 (Exact Date)   SpO2 98%   BMI 29.18 kg/m  Nursing note and vital signs reviewed.  Physical Exam  Constitutional: She is oriented to person, place, and time. She appears well-developed and well-nourished. No distress.  HENT:  Right Ear: Hearing, tympanic membrane, external ear and ear canal normal.  Left Ear: Hearing, tympanic membrane, external ear and ear canal normal.  Nose: Right sinus exhibits maxillary sinus tenderness and frontal sinus tenderness. Left sinus exhibits maxillary sinus tenderness and frontal sinus tenderness.  Mouth/Throat: Uvula is midline, oropharynx is clear and moist and mucous membranes are normal.  Neck: Neck supple.  Cardiovascular: Normal rate, regular rhythm, normal heart sounds and intact distal pulses.   Pulmonary/Chest: Effort normal and breath sounds normal.  Neurological: She is alert and oriented to person, place, and time.  Skin: Skin is warm and dry.  Psychiatric: She has a normal mood and affect. Her behavior is normal. Judgment and thought content normal.       Assessment & Plan:   Problem List Items Addressed This Visit      Respiratory   Acute non-recurrent pansinusitis - Primary    Symptoms and exam consistent with acute pansinusitis most likely bacterial. Start Augmentin. Continue over-the-counter medications as needed for symptom relief and supportive care. Follow-up if symptoms worsen or  do not improve.      Relevant Medications   amoxicillin-clavulanate (AUGMENTIN) 875-125 MG tablet       I am having Ms. Schooley start on amoxicillin-clavulanate. I am also having her maintain her ibuprofen, fluticasone, diclofenac sodium, and lidocaine.   Meds ordered this encounter  Medications  . amoxicillin-clavulanate (AUGMENTIN) 875-125 MG tablet    Sig: Take 1 tablet by mouth 2 (two) times daily.    Dispense:  14 tablet    Refill:  0    Order Specific Question:   Supervising Provider    Answer:   Hillard Danker A [[id number]]     Follow-up: Return if symptoms worsen or fail to improve.  Jeanine Luz, FNP

## 2016-10-09 NOTE — Patient Instructions (Addendum)
Thank you for choosing [facility].  SUMMARY AND INSTRUCTIONS:  Recommend Aleve-D.   Medication:  Your prescription(s) have been submitted to your pharmacy or been printed and provided for you. Please take as directed and contact our office if you believe you are having problem(s) with the medication(s) or have any questions.  Follow up:  If your symptoms worsen or fail to improve, please contact our office for further instruction, or in case of emergency go directly to the emergency room at the closest medical facility.     General Recommendations:    Please drink plenty of fluids.  Get plenty of rest   Sleep in humidified air  Use saline nasal sprays  Netti pot   OTC Medications:  Decongestants - helps relieve congestion   Flonase (generic fluticasone) or Nasacort (generic triamcinolone) - please make sure to use the "cross-over" technique at a 45 degree angle towards the opposite eye as opposed to straight up the nasal passageway.   Sudafed (generic pseudoephedrine - Note this is the one that is available behind the pharmacy counter); Products with phenylephrine (-PE) may also be used but is often not as effective as pseudoephedrine.   If you have HIGH BLOOD PRESSURE - Coricidin HBP; AVOID any product that is -D as this contains pseudoephedrine which may increase your blood pressure.  Afrin (oxymetazoline) every 6-8 hours for up to 3 days.   Allergies - helps relieve runny nose, itchy eyes and sneezing   Claritin (generic loratidine), Allegra (fexofenidine), or Zyrtec (generic cyrterizine) for runny nose. These medications should not cause drowsiness.  Note - Benadryl (generic diphenhydramine) may be used however may cause drowsiness  Cough -   Delsym or Robitussin (generic dextromethorphan)  Expectorants - helps loosen mucus to ease removal   Mucinex (generic guaifenesin) as directed on the package.  Headaches / General Aches   Tylenol (generic  acetaminophen) - DO NOT EXCEED 3 grams (3,000 mg) in a 24 hour time period  Advil/Motrin (generic ibuprofen)   Sore Throat -   Salt water gargle   Chloraseptic (generic benzocaine) spray or lozenges / Sucrets (generic dyclonine)    Sinusitis Sinusitis is redness, soreness, and inflammation of the paranasal sinuses. Paranasal sinuses are air pockets within the bones of your face (beneath the eyes, the middle of the forehead, or above the eyes). In healthy paranasal sinuses, mucus is able to drain out, and air is able to circulate through them by way of your nose. However, when your paranasal sinuses are inflamed, mucus and air can become trapped. This can allow bacteria and other germs to grow and cause infection. Sinusitis can develop quickly and last only a short time (acute) or continue over a long period (chronic). Sinusitis that lasts for more than 12 weeks is considered chronic.  CAUSES  Causes of sinusitis include:  Allergies.  Structural abnormalities, such as displacement of the cartilage that separates your nostrils (deviated septum), which can decrease the air flow through your nose and sinuses and affect sinus drainage.  Functional abnormalities, such as when the small hairs (cilia) that line your sinuses and help remove mucus do not work properly or are not present. SIGNS AND SYMPTOMS  Symptoms of acute and chronic sinusitis are the same. The primary symptoms are pain and pressure around the affected sinuses. Other symptoms include:  Upper toothache.  Earache.  Headache.  Bad breath.  Decreased sense of smell and taste.  A cough, which worsens when you are lying flat.  Fatigue.  Fever.  Thick drainage from your nose, which often is green and may contain pus (purulent).  Swelling and warmth over the affected sinuses. DIAGNOSIS  Your health care provider will perform a physical exam. During the exam, your health care provider may:  Look in your nose for signs  of abnormal growths in your nostrils (nasal polyps).  Tap over the affected sinus to check for signs of infection.  View the inside of your sinuses (endoscopy) using an imaging device that has a light attached (endoscope). If your health care provider suspects that you have chronic sinusitis, one or more of the following tests may be recommended:  Allergy tests.  Nasal culture. A sample of mucus is taken from your nose, sent to a lab, and screened for bacteria.  Nasal cytology. A sample of mucus is taken from your nose and examined by your health care provider to determine if your sinusitis is related to an allergy. TREATMENT  Most cases of acute sinusitis are related to a viral infection and will resolve on their own within 10 days. Sometimes medicines are prescribed to help relieve symptoms (pain medicine, decongestants, nasal steroid sprays, or saline sprays).  However, for sinusitis related to a bacterial infection, your health care provider will prescribe antibiotic medicines. These are medicines that will help kill the bacteria causing the infection.  Rarely, sinusitis is caused by a fungal infection. In theses cases, your health care provider will prescribe antifungal medicine. For some cases of chronic sinusitis, surgery is needed. Generally, these are cases in which sinusitis recurs more than 3 times per year, despite other treatments. HOME CARE INSTRUCTIONS   Drink plenty of water. Water helps thin the mucus so your sinuses can drain more easily.  Use a humidifier.  Inhale steam 3 to 4 times a day (for example, sit in the bathroom with the shower running).  Apply a warm, moist washcloth to your face 3 to 4 times a day, or as directed by your health care provider.  Use saline nasal sprays to help moisten and clean your sinuses.  Take medicines only as directed by your health care provider.  If you were prescribed either an antibiotic or antifungal medicine, finish it all even  if you start to feel better. SEEK IMMEDIATE MEDICAL CARE IF:  You have increasing pain or severe headaches.  You have nausea, vomiting, or drowsiness.  You have swelling around your face.  You have vision problems.  You have a stiff neck.  You have difficulty breathing. MAKE SURE YOU:   Understand these instructions.  Will watch your condition.  Will get help right away if you are not doing well or get worse. Document Released: 08/10/2005 Document Revised: 12/25/2013 Document Reviewed: 08/25/2011 [facility] Patient Information 2015 [place], [place]. This information is not intended to replace advice given to you by your health care provider. Make sure you discuss any questions you have with your health care provider.

## 2016-10-09 NOTE — Assessment & Plan Note (Signed)
Symptoms and exam consistent with acute pansinusitis most likely bacterial. Start Augmentin. Continue over-the-counter medications as needed for symptom relief and supportive care. Follow-up if symptoms worsen or do not improve.

## 2017-01-11 ENCOUNTER — Other Ambulatory Visit: Payer: Self-pay | Admitting: Obstetrics and Gynecology

## 2017-01-13 LAB — CYTOLOGY - PAP

## 2017-04-05 ENCOUNTER — Encounter (HOSPITAL_COMMUNITY): Payer: Self-pay | Admitting: Emergency Medicine

## 2017-04-05 ENCOUNTER — Ambulatory Visit (HOSPITAL_COMMUNITY)
Admission: EM | Admit: 2017-04-05 | Discharge: 2017-04-05 | Disposition: A | Discharge status: Home / Self Care | Payer: BLUE CROSS/BLUE SHIELD | Attending: Family Medicine | Admitting: Family Medicine

## 2017-04-05 ENCOUNTER — Telehealth (HOSPITAL_COMMUNITY): Payer: Self-pay | Admitting: Emergency Medicine

## 2017-04-05 DIAGNOSIS — M79645 Pain in left finger(s): Secondary | ICD-10-CM

## 2017-04-05 MED ORDER — METHYLPREDNISOLONE 4 MG PO TBPK: ORAL_TABLET | ORAL | 0 refills | Status: DC

## 2017-04-05 NOTE — ED Provider Notes (Signed)
  [facility]   [id number] 04/05/17 Arrival Time: 1815  ASSESSMENT & PLAN:  1. Pain of left thumb     Meds ordered this encounter  Medications  . methylPREDNISolone (MEDROL DOSEPAK) 4 MG TBPK tablet    Sig: Take 6-5-4-3-2-1 po qd    Dispense:  21 tablet    Refill:  0    Order Specific Question:   Supervising Provider    Answer:   Caitlin Bryant, Caitlin Bryant [[id number]]   Left Spica Splint  Reviewed expectations re: course of current medical issues. Questions answered. Outlined signs and symptoms indicating need for more acute intervention. Patient verbalized understanding. After Visit Summary given.   SUBJECTIVE:  Caitlin Bryant is a 36 y.o. female who presents with complaint of left thumb pain starting suddenly today.  ROS: As per HPI.   OBJECTIVE:  Vitals:   04/05/17 1831  BP: 132/78  Pulse: 80  Resp: 16  Temp: 98.1 F (36.7 C)  TempSrc: Oral  SpO2: 100%     General appearance: alert; no distress HEENT: normocephalic; atraumatic; conjunctivae normal; Lungs: clear to auscultation bilaterally Heart: regular rate and rhythm MS - Positive finklestein Neurologic: normal symmetric reflexes; normal gait Psychological:  alert and cooperative; normal mood and affect  Results for orders placed or performed in visit on 01/11/17  Cytology - PAP  Result Value Ref Range   CYTOLOGY - PAP PAP RESULT     Labs Reviewed - No data to display  No results found.  Allergies  Allergen Reactions  . Sumatriptan Nausea And Vomiting    Severe nausea and vomiting  . Sulfa Antibiotics Rash    PMHx, SurgHx, SocialHx, Medications, and Allergies were reviewed in the Visit Navigator and updated as appropriate.      Caitlin Bryant, Caitlin Bryant, Caitlin Bryant 04/05/17 [phone]

## 2017-04-05 NOTE — Telephone Encounter (Signed)
The patient requested that her medications be sent to the [facility] on [address].

## 2017-04-05 NOTE — ED Triage Notes (Signed)
[facility] with a complaint of left thumb pain that started this am. The patient denied any known injury.

## 2017-04-15 ENCOUNTER — Ambulatory Visit (INDEPENDENT_AMBULATORY_CARE_PROVIDER_SITE_OTHER): Payer: BLUE CROSS/BLUE SHIELD | Admitting: Internal Medicine

## 2017-04-15 ENCOUNTER — Encounter: Payer: Self-pay | Admitting: Internal Medicine

## 2017-04-15 VITALS — BP 128/86 | HR 76 | Temp 97.6°F | Resp 16 | Wt 178.0 lb

## 2017-04-15 DIAGNOSIS — H6121 Impacted cerumen, right ear: Secondary | ICD-10-CM | POA: Diagnosis not present

## 2017-04-15 DIAGNOSIS — G43819 Other migraine, intractable, without status migrainosus: Secondary | ICD-10-CM

## 2017-04-15 MED ORDER — KETOROLAC TROMETHAMINE 60 MG/2ML IM SOLN
60.0000 mg | Freq: Once | INTRAMUSCULAR | Status: AC
  Administered 2017-04-15: 60 mg via INTRAMUSCULAR

## 2017-04-15 MED ORDER — PROMETHAZINE HCL 25 MG/ML IJ SOLN
25.0000 mg | Freq: Once | INTRAMUSCULAR | Status: AC
  Administered 2017-04-15: 25 mg via INTRAMUSCULAR

## 2017-04-15 NOTE — Assessment & Plan Note (Addendum)
Intractable Possibly related to her new birth control No concerning neurological findings Toradol 60 mg IM x 1 Phenergan 25 mg IM x 1  Call if no improvement Note given for work

## 2017-04-15 NOTE — Patient Instructions (Signed)
You received a Toradol (anti-inflammatory) and Phenergan (anti-nausea) medication today.   A note was given for work.    Your right ear was cleaned out today.

## 2017-04-15 NOTE — Assessment & Plan Note (Signed)
Ear lavage preformed, all wax removed and ear canal and TM normal She tolerated the procedure well

## 2017-04-15 NOTE — Progress Notes (Signed)
Subjective:    Patient ID: Caitlin Bryant, female    DOB: 1981-04-10, 36 y.o.   MRN: [id number]  HPI She is here for an acute visit.   Migraines - she has had them for 10 years.  She typically take Excedrin and it works.    This summer she started a new birth control.  She is supposed to skip a week which is new for her.  She had headaches that week.  Her gyn advised not to skip the week this month and they would start a new ocp after this month.  this is the week she was supposed to skip and she has headaches again.Marland Kitchen   One week ago she started having painful glands on right side, achy all over, next day felt a little better.  Sunday, 5 days ago, she felt good.  Then the headaches started again.  Took Excedrin and it did not help.   Pain is in the back of her head.  It has been very bad at times.  Migraines are usually in left orbit so she is unsure if it is a migraine.    She took advil.  She ended up going to sleep, but headache never went away.  All week she felt awful.  It let up a little.  Last night it got worse.  She has ear pain, head pain in back of head, neck and shoulder.  She is fatigued.   Her husband gave her codeine with ibuprofen - helped a little.  Went from very painful to uncomfortable.   She has tried different medications and nothing seems to be working.  She just wants to feel better.    Medications and allergies reviewed with patient and updated if appropriate.  Patient Active Problem List   Diagnosis Date Noted  . Acute non-recurrent pansinusitis 10/09/2016  . Viral sore throat 09/25/2016  . Neck swelling 08/07/2016  . Mouth sores 07/24/2015  . Depression 07/24/2015  . TMJ dysfunction 07/03/2015  . Migraine 05/10/2015  . Anxiety state 01/31/2015  . Allergic rhinitis 01/31/2015    Current Outpatient Prescriptions on File Prior to Visit  Medication Sig Dispense Refill  . methylPREDNISolone (MEDROL DOSEPAK) 4 MG TBPK tablet Take 6-5-4-3-2-1 po qd 21  tablet 0   No current facility-administered medications on file prior to visit.     Past Medical History:  Diagnosis Date  . MRSA (methicillin resistant staph aureus) culture positive     Past Surgical History:  Procedure Laterality Date  . CESAREAN SECTION  2004 and 2008   x 2    Social History   Social History  . Marital status: Married    Spouse name: N/A  . Number of children: N/A  . Years of education: N/A   Social History Main Topics  . Smoking status: Never Smoker  . Smokeless tobacco: Never Used  . Alcohol use No  . Drug use: No  . Sexual activity: Not on file   Other Topics Concern  . Not on file   Social History Narrative  . No narrative on file    Family History  Problem Relation Age of Onset  . Hypertension Mother   . Diabetes Mother   . Osteoarthritis Mother   . Fibromyalgia Mother   . Heart attack Father        Defibrillator    Review of Systems  Constitutional: Positive for chills. Negative for fever.  HENT: Positive for congestion (mild) and ear pain (both  - ?  clogged). Negative for sinus pain and sore throat.   Eyes: Positive for photophobia and visual disturbance (blurry vision).  Cardiovascular: Negative for chest pain and palpitations.  Gastrointestinal: Positive for nausea. Negative for vomiting.  Musculoskeletal: Positive for myalgias (leg cramp the other night).  Neurological: Positive for dizziness, light-headedness and headaches. Negative for weakness and numbness.       Phonophobia       Objective:   Vitals:   04/15/17 1014  BP: 128/86  Pulse: 76  Resp: 16  Temp: 97.6 F (36.4 C)  SpO2: 98%   Filed Weights   04/15/17 1014  Weight: 178 lb (80.7 kg)   Body mass index is 30.55 kg/m.  Wt Readings from Last 3 Encounters:  04/15/17 178 lb (80.7 kg)  10/09/16 170 lb (77.1 kg)  09/25/16 170 lb (77.1 kg)     Physical Exam GENERAL APPEARANCE: Appears stated age, well appearing, NAD EYES: conjunctiva clear, no  icterus HEENT: left ear canal with mild amount of cerumen, not canal redness, visualized TM normal, right ear canal with excessive cerumen - TM unable to be visualized, oropharynx with no erythema, no thyromegaly, trachea midline, no cervical or supraclavicular lymphadenopathy on left side; mild right sided cervical lymphadenopathy, no right supraclavicular lymphadenopathy LUNGS: Clear to auscultation without wheeze or crackles, unlabored breathing, good air entry bilaterally HEART: Normal S1,S2 without murmurs NEURO; normal gait, normal sensation and strength all extremities EXTREMITIES: Without clubbing, cyanosis, or edema     PRE-PROCEDURE EXAM: Right TM cannot be visualized due to total occlusion/impaction of the ear canal. PROCEDURE INDICATION: remove wax to visualize ear drum & relieve discomfort CONSENT:  Verbal  PROCEDURE NOTE:   RIGHT EAR:  The CMA used a metal wax curette under direct vision with an otoscope to free the wax bolus from the ear wall and then successfully removed a small bit of wax. The ear was then irrigated with warm water to remove the remaining wax. POST- PROCEDURE EXAM: TMs successfully visualized and found to have no erythema        Assessment & Plan:   See Problem List for Assessment and Plan of chronic medical problems.

## 2017-04-16 ENCOUNTER — Encounter (HOSPITAL_COMMUNITY): Payer: Self-pay | Admitting: Emergency Medicine

## 2017-04-16 ENCOUNTER — Ambulatory Visit (HOSPITAL_COMMUNITY)
Admission: EM | Admit: 2017-04-16 | Discharge: 2017-04-16 | Disposition: A | Discharge status: Home / Self Care | Payer: BLUE CROSS/BLUE SHIELD | Attending: Family | Admitting: Family

## 2017-04-16 DIAGNOSIS — T7840XA Allergy, unspecified, initial encounter: Secondary | ICD-10-CM

## 2017-04-16 NOTE — ED Triage Notes (Signed)
Pt reports poss allergic reaction to IM injections she rec'd for migraine HA on both sides of hip.   Sts she got one for the pain and nausea  Denies dysphagia, dyspnea  A&O x4... NAD... Ambulatory

## 2017-04-16 NOTE — ED Provider Notes (Signed)
[facility]    CSN: [id number] Arrival date & time: 04/16/17  1944     History   Chief Complaint Chief Complaint  Patient presents with  . Allergic Reaction    HPI Caitlin Bryant is a 36 y.o. female.   Chief complaint allergic reaction with swelling on bilateral buttocks, x one day , worsening after IM injections. Notes 'knot' where shots were given, swelling, tenderness, bruise. States HA has resolved. No nausea, vomiting, shortness of breath, difficulty breathing, rash.  IM Toradol, Phenergan which is given by her PCP yesterday for acute headache      Past Medical History:  Diagnosis Date  . MRSA (methicillin resistant staph aureus) culture positive     Patient Active Problem List   Diagnosis Date Noted  . Impacted cerumen of right ear 04/15/2017  . Acute non-recurrent pansinusitis 10/09/2016  . Viral sore throat 09/25/2016  . Neck swelling 08/07/2016  . Mouth sores 07/24/2015  . Depression 07/24/2015  . TMJ dysfunction 07/03/2015  . Migraine 05/10/2015  . Anxiety state 01/31/2015  . Allergic rhinitis 01/31/2015    Past Surgical History:  Procedure Laterality Date  . CESAREAN SECTION  2004 and 2008   x 2    OB History    No data available       Home Medications    Prior to Admission medications   Medication Sig Start Date End Date Taking? Authorizing Provider  methylPREDNISolone (MEDROL DOSEPAK) 4 MG TBPK tablet Take 6-5-4-3-2-1 po qd 04/05/17   Deatra Canter, FNP    Family History Family History  Problem Relation Age of Onset  . Hypertension Mother   . Diabetes Mother   . Osteoarthritis Mother   . Fibromyalgia Mother   . Heart attack Father        Defibrillator    Social History Social History  Substance Use Topics  . Smoking status: Never Smoker  . Smokeless tobacco: Never Used  . Alcohol use No     Allergies   Sumatriptan and Sulfa antibiotics   Review of Systems Review of Systems  Constitutional:  Negative for chills and fever.  HENT: Negative for trouble swallowing.   Respiratory: Negative for cough and shortness of breath.   Cardiovascular: Negative for chest pain and palpitations.  Gastrointestinal: Negative for nausea and vomiting.  Musculoskeletal: Negative for back pain.  Skin: Positive for color change. Negative for rash and wound.     Physical Exam Triage Vital Signs ED Triage Vitals  Enc Vitals Group     BP 04/16/17 2010 127/75     Pulse Rate 04/16/17 2010 89     Resp 04/16/17 2010 16     Temp 04/16/17 2010 98 F (36.7 C)     Temp Source 04/16/17 2010 Oral     SpO2 04/16/17 2010 98 %     Weight --      Height --      Head Circumference --      Peak Flow --      Pain Score 04/16/17 2011 6     Pain Loc --      Pain Edu? --      Excl. in GC? --    No data found.   Updated Vital Signs BP 127/75 (BP Location: Left Arm)   Pulse 89   Temp 98 F (36.7 C) (Oral)   Resp 16   LMP 03/12/2017   SpO2 98%   Visual Acuity Right Eye Distance:   Left Eye  Distance:   Bilateral Distance:    Right Eye Near:   Left Eye Near:    Bilateral Near:     Physical Exam  Constitutional: She appears well-developed and well-nourished.  Eyes: Conjunctivae are normal.  Cardiovascular: Normal rate, regular rhythm, normal heart sounds and normal pulses.   Pulmonary/Chest: Effort normal and breath sounds normal. She has no wheezes. She has no rhonchi. She has no rales.  Neurological: She is alert.  Skin: Skin is warm and dry. Ecchymosis noted.     Nodules, ecchymosis noted bilateral buttocks is noted on diagram approx 2cm diameter. Right larger than left. Tender to palpation. Skin intact. No erythema, purulent discharge, drainage.  Psychiatric: She has a normal mood and affect. Her speech is normal and behavior is normal. Thought content normal.  Vitals reviewed.    UC Treatments / Results  Labs (all labs ordered are listed, but only abnormal results are displayed) Labs  Reviewed - No data to display  EKG  EKG Interpretation None       Radiology No results found.  Procedures Procedures (including critical care time)  Medications Ordered in UC Medications - No data to display   Initial Impression / Assessment and Plan / UC Course  I have reviewed the triage vital signs and the nursing notes.  Pertinent labs & imaging results that were available during my care of the patient were reviewed by me and considered in my medical decision making (see chart for details).       Final Clinical Impressions(s) / UC Diagnoses   Final diagnoses:  Allergic reaction, initial encounter   No acute respiratory distress. Localized allergic reaction from IM injections yesterday. Advised patient to use ice for swelling and give more time.  She may use ibuprofen cautiously however advised her since she has headache history, this may cause rebound/medication overuse headache. Patient verbalized understanding. Return precautions given.  New Prescriptions New Prescriptions   No medications on file     Controlled Substance Prescriptions [place] Controlled Substance Registry consulted? Not Applicable   Allegra Grana, [facility] 04/16/17 2111

## 2017-04-16 NOTE — Discharge Instructions (Signed)
As discussed, injection site reaction can be common particularly after medication such as Toradol which is a large quantity.  I would recommend really doing ice therapy tonight and tomorrow. may take ibuprofen if really needed needed however based on  history of headaches, I be afraid this will cause a rebound headache,and would do this cautiously.  Please return for evaluation if not better.

## 2017-06-22 ENCOUNTER — Encounter: Payer: Self-pay | Admitting: Internal Medicine

## 2017-06-22 ENCOUNTER — Ambulatory Visit (INDEPENDENT_AMBULATORY_CARE_PROVIDER_SITE_OTHER): Payer: BLUE CROSS/BLUE SHIELD | Admitting: Internal Medicine

## 2017-06-22 ENCOUNTER — Other Ambulatory Visit (INDEPENDENT_AMBULATORY_CARE_PROVIDER_SITE_OTHER): Payer: BLUE CROSS/BLUE SHIELD

## 2017-06-22 VITALS — BP 140/82 | HR 81 | Temp 97.9°F | Ht 64.0 in | Wt 171.0 lb

## 2017-06-22 DIAGNOSIS — G43819 Other migraine, intractable, without status migrainosus: Secondary | ICD-10-CM

## 2017-06-22 DIAGNOSIS — Z0000 Encounter for general adult medical examination without abnormal findings: Secondary | ICD-10-CM | POA: Diagnosis not present

## 2017-06-22 DIAGNOSIS — Z23 Encounter for immunization: Secondary | ICD-10-CM | POA: Diagnosis not present

## 2017-06-22 LAB — LIPID PANEL
Cholesterol: 155 mg/dL (ref 0–200)
HDL: 47.2 mg/dL (ref >39.00)
LDL Cholesterol: 79 mg/dL (ref 0–99)
NonHDL: 108.28
Total CHOL/HDL Ratio: 3
Triglycerides: 146 mg/dL (ref 0.0–149.0)
VLDL: 29.2 mg/dL (ref 0.0–40.0)

## 2017-06-22 LAB — CBC
HCT: 45.3 % (ref 36.0–46.0)
Hemoglobin: 15.3 g/dL — ABNORMAL HIGH (ref 12.0–15.0)
MCHC: 33.7 g/dL (ref 30.0–36.0)
MCV: 89.2 fl (ref 78.0–100.0)
Platelets: 229 10*3/uL (ref 150.0–400.0)
RBC: 5.08 Mil/uL (ref 3.87–5.11)
RDW: 12.8 % (ref 11.5–15.5)
WBC: 7.8 10*3/uL (ref 4.0–10.5)

## 2017-06-22 LAB — COMPREHENSIVE METABOLIC PANEL
ALBUMIN: 4.5 g/dL (ref 3.5–5.2)
ALK PHOS: 56 U/L (ref 39–117)
ALT: 28 U/L (ref 0–35)
AST: 47 U/L — ABNORMAL HIGH (ref 0–37)
BILIRUBIN TOTAL: 0.6 mg/dL (ref 0.2–1.2)
BUN: 11 mg/dL (ref 6–23)
CALCIUM: 9.6 mg/dL (ref 8.4–10.5)
CO2: 22 mEq/L (ref 19–32)
Chloride: 105 mEq/L (ref 96–112)
Creatinine, Ser: 0.51 mg/dL (ref 0.40–1.20)
GFR: 144.81 mL/min (ref >60.00)
Glucose, Bld: 95 mg/dL (ref 70–99)
POTASSIUM: 4 meq/L (ref 3.5–5.1)
Sodium: 139 mEq/L (ref 135–145)
TOTAL PROTEIN: 7.5 g/dL (ref 6.0–8.3)

## 2017-06-22 LAB — HEMOGLOBIN A1C: HEMOGLOBIN A1C: 5.3 % (ref 4.6–6.5)

## 2017-06-22 MED ORDER — TOPIRAMATE 50 MG PO TABS: 50.0000 mg | ORAL_TABLET | Freq: Every day | ORAL | 6 refills | Status: DC

## 2017-06-22 NOTE — Assessment & Plan Note (Signed)
Checking labs today and adjust as needed. Rx for topiramate nightly for headache prevention and asked to make a log of diet, exercise, sleep, caffeine to track triggers for her headaches. Talked about analgesic rebound and asked to limit excedrin and ibuprofen usage.

## 2017-06-22 NOTE — Progress Notes (Signed)
   Subjective:    Patient ID: Caitlin Bryant, female    DOB: 07-25-1981, 36 y.o.   MRN: [id number]  HPI The patient is a 36 YO female coming in for migraine headaches. She has been seen several times for this in the past and had adjustment of her birth control over the summer. She is usually able to take excedrin for headaches with good relief. She is not taking birth control anymore. Has 2-3 migraines per month around cycles which last 2-3 days each. She is trying anything for them without good relief. She is also having 2-3 headaches per week for which she is taking excedrin and ibuprofen with partial relief and they last 6-12 hours. She is not able to find any triggers although she has not kept a log of foods. Denies vision changes, numbness, weakness. Mild headache today.   Review of Systems  Constitutional: Positive for appetite change. Negative for activity change, fatigue, fever and unexpected weight change.  HENT: Negative.   Eyes: Negative.   Respiratory: Negative.   Cardiovascular: Negative.   Gastrointestinal: Negative.   Musculoskeletal: Negative.   Skin: Negative.   Neurological: Positive for headaches. Negative for dizziness, tremors, syncope, weakness, light-headedness and numbness.      Objective:   Physical Exam  Constitutional: She is oriented to person, place, and time. She appears well-developed and well-nourished.  HENT:  Head: Normocephalic and atraumatic.  Eyes: EOM are normal.  Neck: Normal range of motion.  Cardiovascular: Normal rate and regular rhythm.   Pulmonary/Chest: Effort normal and breath sounds normal. No respiratory distress. She has no wheezes. She has no rales.  Abdominal: Soft. Bowel sounds are normal. She exhibits no distension. There is no tenderness. There is no rebound.  Musculoskeletal: She exhibits no edema.  Neurological: She is alert and oriented to person, place, and time. Coordination normal.  Skin: Skin is warm and dry.   Vitals:     06/22/17 0942  BP: 140/82  Pulse: 81  Temp: 97.9 F (36.6 C)  TempSrc: Oral  SpO2: 99%  Weight: 171 lb (77.6 kg)  Height: 5\' 4"  (1.626 m)      Assessment & Plan:  Flu shot given at visit

## 2017-06-22 NOTE — Patient Instructions (Signed)
We are checking the labs today and will send you the results.  We are sending in topiramate to take 1 pill in the evening to reduce the amount of headaches. It can take 2 weeks to kick in.   We are going to have you keep a journal of foods and sleep and caffeine and headaches to see if we can find a link for the headaches or a trigger.

## 2017-06-24 ENCOUNTER — Ambulatory Visit: Payer: BLUE CROSS/BLUE SHIELD

## 2017-06-24 ENCOUNTER — Telehealth: Payer: Self-pay | Admitting: Internal Medicine

## 2017-06-24 NOTE — Telephone Encounter (Signed)
Pt given test results from 10/30 and expressed understanding, states she does not drink alcohol, and does not take tylenol, but she does take ibuprofen and excedrine a couple times a week PRN for headaches  Please advise and call back in regard

## 2017-06-28 NOTE — Telephone Encounter (Signed)
We will monitor again at next visit

## 2017-06-28 NOTE — Telephone Encounter (Signed)
Called and informed patient. Patient stated understanding  

## 2018-02-26 ENCOUNTER — Ambulatory Visit: Payer: BLUE CROSS/BLUE SHIELD | Admitting: Family Medicine

## 2018-02-26 ENCOUNTER — Encounter: Payer: Self-pay | Admitting: Family Medicine

## 2018-02-26 ENCOUNTER — Other Ambulatory Visit: Payer: Self-pay

## 2018-02-26 VITALS — BP 110/78 | HR 82 | Temp 98.0°F | Resp 14 | Ht 64.0 in | Wt 179.0 lb

## 2018-02-26 DIAGNOSIS — S93402A Sprain of unspecified ligament of left ankle, initial encounter: Secondary | ICD-10-CM

## 2018-02-26 DIAGNOSIS — M25572 Pain in left ankle and joints of left foot: Secondary | ICD-10-CM

## 2018-02-26 NOTE — Progress Notes (Signed)
Subjective:    Patient ID: Caitlin Bryant, female    DOB: 06/08/1981, 37 y.o.   MRN: [id number]  Chief Complaint  Patient presents with  . Ankle Pain    HPI Patient is in today for evaluation of left ankle pain and swelling. 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 Over he past few days she has noted at times her whole leg up to her hip hurts at times. When she puts her foot up the swelling improves.  Past Medical History:  Diagnosis Date  . MRSA (methicillin resistant staph aureus) culture positive     Past Surgical History:  Procedure Laterality Date  . CESAREAN SECTION  2004 and 2008   x 2    Family History  Problem Relation Age of Onset  . Hypertension Mother   . Diabetes Mother   . Osteoarthritis Mother   . Fibromyalgia Mother   . Heart attack Father        Defibrillator    Social History   Socioeconomic History  . Marital status: Married    Spouse name: Not on file  . Number of children: Not on file  . Years of education: Not on file  . Highest education level: Not on file  Occupational History  . Not on file  Social Needs  . Financial resource strain: Not on file  . Food insecurity:    Worry: Not on file    Inability: Not on file  . Transportation needs:    Medical: Not on file    Non-medical: Not on file  Tobacco Use  . Smoking status: Never Smoker  . Smokeless tobacco: Never Used  Substance and Sexual Activity  . Alcohol use: No  . Drug use: No  . Sexual activity: Not on file  Lifestyle  . Physical activity:    Days per week: Not on file    Minutes per session: Not on file  . Stress: Not on file  Relationships  . Social connections:    Talks on phone: Not on file    Gets together: Not on file    Attends religious service: Not on  file    Active member of club or organization: Not on file    Attends meetings of clubs or organizations: Not on file    Relationship status: Not on file  . Intimate partner violence:    Fear of current or ex partner: Not on file    Emotionally abused: Not on file    Physically abused: Not on file    Forced sexual activity: Not on file  Other Topics Concern  . Not on file  Social History Narrative  . Not on file    Outpatient Medications Prior to Visit  Medication Sig Dispense Refill  . topiramate (TOPAMAX) 50 MG tablet Take 1 tablet (50 mg total) by mouth daily. 30 tablet 6   No facility-administered medications prior to visit.     Allergies  Allergen Reactions  . Sumatriptan Nausea And Vomiting    Severe nausea and vomiting  . Sulfa Antibiotics Rash    Review of Systems  Constitutional: Negative for fever and malaise/fatigue.  HENT: Negative for congestion.   Eyes: Negative for blurred vision.  Respiratory: Negative for shortness of  breath.   Cardiovascular: Positive for leg swelling. Negative for chest pain and palpitations.  Gastrointestinal: Negative for abdominal pain, blood in stool and nausea.  Genitourinary: Negative for dysuria and frequency.  Musculoskeletal: Positive for myalgias. Negative for falls.  Skin: Negative for rash.  Neurological: Negative for dizziness, loss of consciousness and headaches.  Endo/Heme/Allergies: Negative for environmental allergies.  Psychiatric/Behavioral: Negative for depression. The patient is nervous/anxious.        Objective:    Physical Exam  Constitutional: She is oriented to person, place, and time. She appears well-developed and well-nourished. No distress.  HENT:  Head: Normocephalic and atraumatic.  Nose: Nose normal.  Eyes: Right eye exhibits no discharge. Left eye exhibits no discharge.  Neck: Normal range of motion. Neck supple.  Cardiovascular: Normal rate and regular rhythm.  No murmur  heard. Pulmonary/Chest: Effort normal and breath sounds normal.  Abdominal: Soft. Bowel sounds are normal. There is no tenderness.  Musculoskeletal: She exhibits edema.  Slight swelling over lateral malleolus. Some pain with internal rotation and flexion of foot over lateral malleolus and posterior to that.   Neurological: She is alert and oriented to person, place, and time.  Skin: Skin is warm and dry.  Psychiatric: She has a normal mood and affect.  Nursing note and vitals reviewed.   BP 110/78   Pulse 82   Temp 98 F (36.7 C) (Oral)   Resp 14   Ht 5\' 4"  (1.626 m)   Wt 179 lb (81.2 kg)   SpO2 98%   BMI 30.73 kg/m  Wt Readings from Last 3 Encounters:  02/26/18 179 lb (81.2 kg)  06/22/17 171 lb (77.6 kg)  04/15/17 178 lb (80.7 kg)     Lab Results  Component Value Date   WBC 7.8 06/22/2017   HGB 15.3 (H) 06/22/2017   HCT 45.3 06/22/2017   PLT 229.0 06/22/2017   GLUCOSE 95 06/22/2017   CHOL 155 06/22/2017   TRIG 146.0 06/22/2017   HDL 47.20 06/22/2017   LDLCALC 79 06/22/2017   ALT 28 06/22/2017   AST 47 (H) 06/22/2017   NA 139 06/22/2017   K 4.0 06/22/2017   CL 105 06/22/2017   CREATININE 0.51 06/22/2017   BUN 11 06/22/2017   CO2 22 06/22/2017   HGBA1C 5.3 06/22/2017    No results found for: TSH Lab Results  Component Value Date   WBC 7.8 06/22/2017   HGB 15.3 (H) 06/22/2017   HCT 45.3 06/22/2017   MCV 89.2 06/22/2017   PLT 229.0 06/22/2017   Lab Results  Component Value Date   NA 139 06/22/2017   K 4.0 06/22/2017   CO2 22 06/22/2017   GLUCOSE 95 06/22/2017   BUN 11 06/22/2017   CREATININE 0.51 06/22/2017   BILITOT 0.6 06/22/2017   ALKPHOS 56 06/22/2017   AST 47 (H) 06/22/2017   ALT 28 06/22/2017   PROT 7.5 06/22/2017   ALBUMIN 4.5 06/22/2017   CALCIUM 9.6 06/22/2017   GFR 144.81 06/22/2017   Lab Results  Component Value Date   CHOL 155 06/22/2017   Lab Results  Component Value Date   HDL 47.20 06/22/2017   Lab Results  Component  Value Date   LDLCALC 79 06/22/2017   Lab Results  Component Value Date   TRIG 146.0 06/22/2017   Lab Results  Component Value Date   CHOLHDL 3 06/22/2017   Lab Results  Component Value Date   HGBA1C 5.3 06/22/2017       Assessment & Plan:  Problem List Items Addressed This Visit    Ankle pain, left    She recalls rolling her ankle a couple of months ago maybe in April it hurt at the time but had fully resolved then 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 Over he past few days she has noted at times her whole leg up to her hip hurts at times. When she puts her foot up the swelling improves. Encouraged to wrap, ice and consider elevate feet, try compression hose and minimize sodium. Can use Tylenol and Ibuprofen and referred to sports med for further evaluate and treat.        Other Visit Diagnoses    Sprain of left ankle, unspecified ligament, initial encounter    -  Primary   Relevant Orders   Ambulatory referral to Sports Medicine      I am having Jakira D. Fries maintain her topiramate.  No orders of the defined types were placed in this encounter.    Danise EdgeStacey Blyth, MD

## 2018-02-26 NOTE — Assessment & Plan Note (Signed)
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 Over he past few days she has noted at times her whole leg up to her hip hurts at times. When she puts her foot up the swelling improves. Encouraged to wrap, ice and consider elevate feet, try compression hose and minimize sodium. Can use Tylenol and Ibuprofen and referred to sports med for further evaluate and treat.

## 2018-02-26 NOTE — Patient Instructions (Signed)
Tylenol ES 500 mg twice Advil 200 mg tabs, 2 twice daily  Consider wraps and/or compression hose Elevate and minimize sodium Ankle Sprain An ankle sprain is a stretch or tear in one of the tough, fiber-like tissues (ligaments) in the ankle. The ligaments in your ankle help to hold the bones of the ankle together. What are the causes? This condition is often caused by stepping on or falling on the outer edge of the foot. What increases the risk? This condition is more likely to develop in people who play sports. What are the signs or symptoms? Symptoms of this condition include:  Pain in your ankle.  Swelling.  Bruising. Bruising may develop right after you sprain your ankle or 1-2 days later.  Trouble standing or walking, especially when you turn or change directions.  How is this diagnosed? This condition is diagnosed with a physical exam. During the exam, your health care provider will press on certain parts of your foot and ankle and try to move them in certain ways. X-rays may be taken to see how severe the sprain is and to check for broken bones. How is this treated? This condition may be treated with:  A brace. This is used to keep the ankle from moving until it heals.  An elastic bandage. This is used to support the ankle.  Crutches.  Pain medicine.  Surgery. This may be needed if the sprain is severe.  Physical therapy. This may help to improve the range of motion in the ankle.  Follow these instructions at home:  Rest your ankle.  Take over-the-counter and prescription medicines only as told by your health care provider.  For 2-3 days, keep your ankle raised (elevated) above the level of your heart as much as possible.  If directed, apply ice to the area: ? Put ice in a plastic bag. ? Place a towel between your skin and the bag. ? Leave the ice on for 20 minutes, 2-3 times a day.  If you were given a brace: ? Wear it as directed. ? Remove it to shower or  bathe. ? Try not to move your ankle much, but wiggle your toes from time to time. This helps to prevent swelling.  If you were given an elastic bandage (dressing): ? Remove it to shower or bathe. ? Try not to move your ankle much, but wiggle your toes from time to time. This helps to prevent swelling. ? Adjust the dressing to make it more comfortable if it feels too tight. ? Loosen the dressing if you have numbness or tingling in your foot, or if your foot becomes cold and blue.  If you have crutches, use them as told by your health care provider. Continue to use them until you can walk without feeling pain in your ankle. Contact a health care provider if:  You have rapidly increasing bruising or swelling.  Your pain is not relieved with medicine. Get help right away if:  Your toes or foot becomes numb or blue.  You have severe pain that gets worse. This information is not intended to replace advice given to you by your health care provider. Make sure you discuss any questions you have with your health care provider. Document Released: 08/10/2005 Document Revised: 09/17/2016 Document Reviewed: 03/12/2015 Elsevier Interactive Patient Education  [employer]2018 [employer]

## 2018-03-03 ENCOUNTER — Ambulatory Visit: Payer: BLUE CROSS/BLUE SHIELD | Admitting: Family Medicine

## 2018-03-08 ENCOUNTER — Ambulatory Visit (INDEPENDENT_AMBULATORY_CARE_PROVIDER_SITE_OTHER): Payer: BLUE CROSS/BLUE SHIELD

## 2018-03-08 ENCOUNTER — Encounter: Payer: Self-pay | Admitting: Family Medicine

## 2018-03-08 ENCOUNTER — Ambulatory Visit: Payer: BLUE CROSS/BLUE SHIELD | Admitting: Family Medicine

## 2018-03-08 VITALS — BP 126/72 | HR 86 | Ht 64.0 in | Wt 182.0 lb

## 2018-03-08 DIAGNOSIS — M25572 Pain in left ankle and joints of left foot: Secondary | ICD-10-CM | POA: Diagnosis not present

## 2018-03-08 DIAGNOSIS — M25372 Other instability, left ankle: Secondary | ICD-10-CM

## 2018-03-08 MED ORDER — DICLOFENAC SODIUM 2 % TD SOLN: 1.0000 "application " | Freq: Two times a day (BID) | TRANSDERMAL | 3 refills | Status: DC

## 2018-03-08 NOTE — Progress Notes (Signed)
Caitlin Bryant - 37 y.o. female MRN [id number]  Date of birth: 06/11/1981  SUBJECTIVE:  Including CC & ROS.  Chief Complaint  Patient presents with  . left foot pain    Caitlin Bryant is a 37 y.o. female that is presenting with left foot and ankle pain. Ongoing for two months.  Pain is located on the lateral aspect of her left foot and medial portion. Admits to numbness and tingling. Swelling and tenderness. She has been been taking motrin for the pain and applying ice. Pain is mild to severe with activity and standing for long periods. Admits to weakness. Denies injury or inciting event.   Review of Systems  Constitutional: Negative for fever.  HENT: Negative for congestion.   Respiratory: Negative for cough.   Cardiovascular: Negative for chest pain.  Gastrointestinal: Negative for abdominal pain.  Musculoskeletal: Negative for joint swelling.  Skin: Negative for color change.  Neurological: Negative for weakness.  Hematological: Negative for adenopathy.  Psychiatric/Behavioral: Negative for agitation.    HISTORY: Past Medical, Surgical, Social, and Family History Reviewed & Updated per EMR.   Pertinent Historical Findings include:  Past Medical History:  Diagnosis Date  . MRSA (methicillin resistant staph aureus) culture positive     Past Surgical History:  Procedure Laterality Date  . CESAREAN SECTION  2004 and 2008   x 2    Allergies  Allergen Reactions  . Sumatriptan Nausea And Vomiting    Severe nausea and vomiting  . Sulfa Antibiotics Rash    Family History  Problem Relation Age of Onset  . Hypertension Mother   . Diabetes Mother   . Osteoarthritis Mother   . Fibromyalgia Mother   . Heart attack Father        Defibrillator     Social History   Socioeconomic History  . Marital status: Married    Spouse name: Not on file  . Number of children: Not on file  . Years of education: Not on file  . Highest education level: Not on file    Occupational History  . Not on file  Social Needs  . Financial resource strain: Not on file  . Food insecurity:    Worry: Not on file    Inability: Not on file  . Transportation needs:    Medical: Not on file    Non-medical: Not on file  Tobacco Use  . Smoking status: Never Smoker  . Smokeless tobacco: Never Used  Substance and Sexual Activity  . Alcohol use: No  . Drug use: No  . Sexual activity: Not on file  Lifestyle  . Physical activity:    Days per week: Not on file    Minutes per session: Not on file  . Stress: Not on file  Relationships  . Social connections:    Talks on phone: Not on file    Gets together: Not on file    Attends religious service: Not on file    Active member of club or organization: Not on file    Attends meetings of clubs or organizations: Not on file    Relationship status: Not on file  . Intimate partner violence:    Fear of current or ex partner: Not on file    Emotionally abused: Not on file    Physically abused: Not on file    Forced sexual activity: Not on file  Other Topics Concern  . Not on file  Social History Narrative  . Not on file  PHYSICAL EXAM:  VS: BP 126/72 (BP Location: Left Arm, Patient Position: Sitting, Cuff Size: Normal)   Pulse 86   Ht 5\' 4"  (1.626 m)   Wt 182 lb (82.6 kg)   SpO2 98%   BMI 31.24 kg/m  Physical Exam Gen: NAD, alert, cooperative with exam, well-appearing ENT: normal lips, normal nasal mucosa,  Eye: normal EOM, normal conjunctiva and lids CV:  no edema, +2 pedal pulses   Resp: no accessory muscle use, non-labored,  Skin: no rashes, no areas of induration  Neuro: normal tone, normal sensation to touch Psych:  normal insight, alert and oriented MSK:  Left ankle:  TTP along the tarsal tunnel, anterior ankle joint and over the peroneal tendons  Medial talar tilt  Normal strength to resistance in all directions  Normal longitudinal arch  End point with anterior drawer  No effusion noted   Unable to maintain balance with one foot testing  Normal gait  Neurovascularly intact   Limited ultrasound: left ankle/foot:  Normal appearing achilles  Normal appearing posterior tib, talar dome Peroneal tendons appeared thickened at the lateral malleolus but same appearence on right side. Normal insertion into the base of hte 5th MT  No midfoot arthritis appreciated   Summary: normal exam.   Ultrasound and interpretation by Clare GandyJeremy Schmitz, MD        ASSESSMENT & PLAN:   Ankle instability, left US was unrevealing for any defect. Likely related to instability and has talar shift on left.  - pennsaid - vimovo samples provided  - counseled on HEP and thera band  - advised to try OTC insoles  - if no improvement consider PT and/or custom orthotics

## 2018-03-08 NOTE — Patient Instructions (Signed)
Nice to meet you  Please try the exercises  Please the pennsaid  Please continue to ice  Please follow up in 4-6 weeks if you pain hasn't improved.

## 2018-03-08 NOTE — Assessment & Plan Note (Signed)
US was unrevealing for any defect. Likely related to instability and has talar shift on left.  - pennsaid - vimovo samples provided  - counseled on HEP and thera band  - advised to try OTC insoles  - if no improvement consider PT and/or custom orthotics

## 2018-03-11 ENCOUNTER — Ambulatory Visit: Payer: BLUE CROSS/BLUE SHIELD | Admitting: Family Medicine

## 2018-04-20 ENCOUNTER — Encounter: Payer: Self-pay | Admitting: Internal Medicine

## 2018-04-20 ENCOUNTER — Ambulatory Visit: Payer: BLUE CROSS/BLUE SHIELD | Admitting: Internal Medicine

## 2018-04-20 VITALS — BP 110/70 | HR 81 | Temp 98.1°F | Ht 64.0 in | Wt 183.0 lb

## 2018-04-20 DIAGNOSIS — M25562 Pain in left knee: Secondary | ICD-10-CM | POA: Diagnosis not present

## 2018-04-20 DIAGNOSIS — Z23 Encounter for immunization: Secondary | ICD-10-CM

## 2018-04-20 MED ORDER — PREDNISONE 20 MG PO TABS
40.0000 mg | ORAL_TABLET | Freq: Every day | ORAL | 0 refills | Status: DC
Start: 2018-04-20 — End: 2018-05-10

## 2018-04-20 NOTE — Assessment & Plan Note (Signed)
Do not suspect any tendon tear at this time. Rx for prednisone to help with inflammation. Advised to use pennsaid she has at home for pain.

## 2018-04-20 NOTE — Progress Notes (Signed)
   Subjective:    Patient ID: Caitlin Bryant, female    DOB: 04-21-1981, 37 y.o.   MRN: [id number]  HPI The patient is a 37 YO female coming in for left knee pain. Had a fall at open house on 04/11/18 in the parking lot. She rolled her right ankle and went down onto her left knee. Does not know mechanism of fall. Is not sure if she twisted her knee. Bruising on the top of the knee which is hurting still. She is being limited in her mobility since that time. She is still working but gets pain in her knee and down her leg with prolonged activity or standing. She denies swelling of the knee significantly. She denies instability of the knee or buckling. She denies red rash. No calf swelling or tenderness. Has tried tylenol, ibuprofen, ice which was not helpful.   Review of Systems  Constitutional: Positive for activity change. Negative for appetite change, chills, fatigue, fever and unexpected weight change.  Respiratory: Negative.   Cardiovascular: Negative.   Gastrointestinal: Negative.   Musculoskeletal: Positive for arthralgias and myalgias. Negative for back pain, gait problem and joint swelling.  Skin: Negative.   Neurological: Negative.       Objective:   Physical Exam  Constitutional: She is oriented to person, place, and time. She appears well-developed and well-nourished.  HENT:  Head: Normocephalic and atraumatic.  Eyes: EOM are normal.  Neck: Normal range of motion.  Cardiovascular: Normal rate and regular rhythm.  Pulmonary/Chest: Effort normal and breath sounds normal. No respiratory distress. She has no wheezes. She has no rales.  Musculoskeletal: She exhibits tenderness. She exhibits no edema.  Pain in the left knee around the medial edge of the patella, bruise overlying the skin. No significant knee effusion, ACL and PCL intact. No tenderness over the LCL or MCL.   Neurological: She is alert and oriented to person, place, and time. Coordination normal.  Skin: Skin is warm  and dry.   Vitals:   04/20/18 0905  BP: 110/70  Pulse: 81  Temp: 98.1 F (36.7 C)  TempSrc: Oral  SpO2: 98%  Weight: 183 lb (83 kg)  Height: 5\' 4"  (1.626 m)      Assessment & Plan:  Flu shot given at visit.

## 2018-04-20 NOTE — Patient Instructions (Signed)
We have sent in prednisone to take 2 pills at night for 4 days.  Try the pennsaid you have at home on the knee up to 4 times per day.

## 2018-05-10 ENCOUNTER — Ambulatory Visit: Payer: BLUE CROSS/BLUE SHIELD | Admitting: Internal Medicine

## 2018-05-10 ENCOUNTER — Ambulatory Visit (INDEPENDENT_AMBULATORY_CARE_PROVIDER_SITE_OTHER)
Admission: RE | Admit: 2018-05-10 | Discharge: 2018-05-10 | Disposition: A | Discharge status: Home / Self Care | Payer: BLUE CROSS/BLUE SHIELD | Source: Ambulatory Visit | Attending: Internal Medicine | Admitting: Internal Medicine

## 2018-05-10 ENCOUNTER — Encounter: Payer: Self-pay | Admitting: Internal Medicine

## 2018-05-10 VITALS — BP 126/90 | HR 85 | Temp 98.2°F | Ht 64.0 in | Wt 184.0 lb

## 2018-05-10 DIAGNOSIS — M25562 Pain in left knee: Secondary | ICD-10-CM

## 2018-05-10 NOTE — Assessment & Plan Note (Signed)
No improvement over the last 3 weeks. Given pain over the tibia will check x-ray knee today. Referral to sports medicine for evaluation of tendons for tear which could be delaying her healing.

## 2018-05-10 NOTE — Progress Notes (Signed)
   Subjective:    Patient ID: Caitlin Bryant, female    DOB: [birth year], 37 y.o.   MRN: [id number]  HPI The patient is a 37 YO female coming in for left knee pain. Seen about 2-3 weeks ago for same and given prednisone burst. This has been stable since last time but not improving. She is unable to kneel. She cannot stand or walk for a long time without pain. Denies instability of collapse of knee. Denies rash or swelling of the knee. She had fall about 1 month ago and does not know mechanism of fall. Denies swelling after fall. Still hurting same. Using otc ibuprofen rarely although it does not help much. Did not try pennsaid on the knee. This is limiting her life.   Review of Systems  Constitutional: Positive for activity change. Negative for appetite change, chills, fatigue, fever and unexpected weight change.  Respiratory: Negative.   Cardiovascular: Negative.   Gastrointestinal: Negative.   Musculoskeletal: Positive for arthralgias and myalgias. Negative for back pain, gait problem and joint swelling.  Skin: Negative.   Neurological: Negative.       Objective:   Physical Exam  Constitutional: She is oriented to person, place, and time. She appears well-developed and well-nourished.  HENT:  Head: Normocephalic and atraumatic.  Eyes: EOM are normal.  Neck: Normal range of motion.  Cardiovascular: Normal rate and regular rhythm.  Pulmonary/Chest: Effort normal and breath sounds normal. No respiratory distress. She has no wheezes. She has no rales.  Musculoskeletal: She exhibits tenderness. She exhibits no edema.  Pain inferior to the left knee, medial pain as well. No pain superior to knee or lateral. ACL and PCL intact left knee.   Neurological: She is alert and oriented to person, place, and time. Coordination normal.  Skin: Skin is warm and dry.   Vitals:   05/10/18 0905  BP: 126/90  Pulse: 85  Temp: 98.2 F (36.8 C)  TempSrc: Oral  SpO2: 98%  Weight: 184 lb (83.5 kg)    Height: 5\' 4"  (1.626 m)      Assessment & Plan:

## 2018-05-10 NOTE — Patient Instructions (Signed)
We will get the x-ray today.   

## 2018-05-11 NOTE — Progress Notes (Signed)
Caitlin Bryant Heatherly - 37 y.o. female MRN [id number]  Date of birth: Sep 19, 1980  SUBJECTIVE:  Including CC & ROS.  Chief Complaint  Patient presents with  . Knee Pain    Caitlin Bryant Gunther is a 37 y.o. female that is presenting with left knee pain. She fell on 04/11/18 when she stepped off a curb. Admits to swelling and tenderness. Pain is worse during flexion, severe bearing weight on it. Pain is located on the mid-anterior aspect of her left knee and radiates down her leg. Bruising present. She has been taking motrin for the pain.   Independent review of the left knee x-ray from 9/17 shows no degenerative changes.   Review of Systems  Constitutional: Negative for fever.  HENT: Negative for congestion.   Respiratory: Negative for cough.   Cardiovascular: Negative for chest pain.  Gastrointestinal: Negative for abdominal pain.  Musculoskeletal: Negative for back pain.  Skin: Negative for color change.  Neurological: Negative for weakness.  Hematological: Negative for adenopathy.  Psychiatric/Behavioral: Negative for agitation.    HISTORY: Past Medical, Surgical, Social, and Family History Reviewed & Updated per EMR.   Pertinent Historical Findings include:  Past Medical History:  Diagnosis Date  . MRSA (methicillin resistant staph aureus) culture positive     Past Surgical History:  Procedure Laterality Date  . CESAREAN SECTION  2004 and 2008   x 2    Allergies  Allergen Reactions  . Sumatriptan Nausea And Vomiting    Severe nausea and vomiting  . Sulfa Antibiotics Rash    Family History  Problem Relation Age of Onset  . Hypertension Mother   . Diabetes Mother   . Osteoarthritis Mother   . Fibromyalgia Mother   . Heart attack Father        Defibrillator     Social History   Socioeconomic History  . Marital status: Married    Spouse name: Not on file  . Number of children: Not on file  . Years of education: Not on file  . Highest education level: Not on  file  Occupational History  . Not on file  Social Needs  . Financial resource strain: Not on file  . Food insecurity:    Worry: Not on file    Inability: Not on file  . Transportation needs:    Medical: Not on file    Non-medical: Not on file  Tobacco Use  . Smoking status: Never Smoker  . Smokeless tobacco: Never Used  Substance and Sexual Activity  . Alcohol use: No  . Drug use: No  . Sexual activity: Not on file  Lifestyle  . Physical activity:    Days per week: Not on file    Minutes per session: Not on file  . Stress: Not on file  Relationships  . Social connections:    Talks on phone: Not on file    Gets together: Not on file    Attends religious service: Not on file    Active member of club or organization: Not on file    Attends meetings of clubs or organizations: Not on file    Relationship status: Not on file  . Intimate partner violence:    Fear of current or ex partner: Not on file    Emotionally abused: Not on file    Physically abused: Not on file    Forced sexual activity: Not on file  Other Topics Concern  . Not on file  Social History Narrative  . Not on  file     PHYSICAL EXAM:  VS: BP 128/74   Pulse 78   Ht 5\' 4"  (1.626 m)   Wt 167 lb (75.8 kg)   LMP 04/16/2018   SpO2 99%   BMI 28.67 kg/m  Physical Exam Gen: NAD, alert, cooperative with exam, well-appearing ENT: normal lips, normal nasal mucosa,  Eye: normal EOM, normal conjunctiva and lids CV:  no edema, +2 pedal pulses   Resp: no accessory muscle use, non-labored,   Skin: no rashes, no areas of induration  Neuro: normal tone, normal sensation to touch Psych:  normal insight, alert and oriented MSK:  Left knee: Normal to inspection with no erythema or effusion or obvious bony abnormalities. Palpation normal with no warmth,  Tender to palpation of the medial joint line and medial femoral condyle. ROM full in flexion and extension and lower leg rotation. Ligaments with solid  consistent endpoints including  LCL, MCL. Negative Thessaly tests. Non painful patellar compression. Patellar and quadriceps tendons unremarkable. Hamstring and quadriceps strength is normal.  Neurovascularly intact    Limited ultrasound: left knee:  Trace effusion  Normal appearing medial and lateral joint line  Normal appearing MCL and LCL   Summary: normal exam   Ultrasound and interpretation by Clare Gandy, MD        ASSESSMENT & PLAN:   Left knee pain No significant effusion.  Not likely for meniscal injury.  Possible for bony contusion. -Naproxen. -Counseled on supportive care. -Counseled on home exercise therapy. -If no improvement may need to consider physical therapy versus further imaging.

## 2018-05-12 ENCOUNTER — Ambulatory Visit: Payer: BLUE CROSS/BLUE SHIELD | Admitting: Family Medicine

## 2018-05-12 ENCOUNTER — Encounter: Payer: Self-pay | Admitting: Family Medicine

## 2018-05-12 DIAGNOSIS — M25562 Pain in left knee: Secondary | ICD-10-CM

## 2018-05-12 MED ORDER — NAPROXEN 500 MG PO TABS: 500.0000 mg | ORAL_TABLET | Freq: Two times a day (BID) | ORAL | 0 refills | Status: AC

## 2018-05-12 NOTE — Patient Instructions (Signed)
Good to see you  Please try to ice your knee for 20 minutes at a time and 3-4 times daily  Please try the naproxen for 10 days straight and then as needed  Please try compression  Please try the exercises  Please see me back in 2-3 weeks if the pain isn't improving.

## 2018-05-15 NOTE — Assessment & Plan Note (Signed)
No significant effusion.  Not likely for meniscal injury.  Possible for bony contusion. -Naproxen. -Counseled on supportive care. -Counseled on home exercise therapy. -If no improvement may need to consider physical therapy versus further imaging.

## 2018-06-09 ENCOUNTER — Ambulatory Visit: Payer: BLUE CROSS/BLUE SHIELD | Admitting: Internal Medicine

## 2018-06-09 ENCOUNTER — Encounter: Payer: Self-pay | Admitting: Internal Medicine

## 2018-06-09 DIAGNOSIS — J029 Acute pharyngitis, unspecified: Secondary | ICD-10-CM

## 2018-06-09 LAB — POCT RAPID STREP A (OFFICE): Rapid Strep A Screen: NEGATIVE

## 2018-06-09 MED ORDER — AMOXICILLIN-POT CLAVULANATE 875-125 MG PO TABS: 1.0000 | ORAL_TABLET | Freq: Two times a day (BID) | ORAL | 0 refills | Status: DC

## 2018-06-09 NOTE — Patient Instructions (Addendum)
We have sent in augmentin to take 1 pill twice a day for 7 days.   The strep test is negative today.

## 2018-06-09 NOTE — Progress Notes (Signed)
   Subjective:    Patient ID: Caitlin Bryant, female    DOB: 1981/08/04, 37 y.o.   MRN: [id number]  HPI The patient is a 37 YO female coming in for sore throat and sinus symptoms. Started about 4-5 days ago. Having some fevers and chills at home. Minimal cough and no SOB. She is having sore throat which is worsening. Having some ear pressure both ears intermittent. Taking ibuprofen for pain and this is helping slightly. 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   Review of Systems  Constitutional: Positive for activity change, appetite change, chills and fever. Negative for fatigue and unexpected weight change.  HENT: Positive for congestion, ear pain, postnasal drip, rhinorrhea, sinus pressure and sore throat. Negative for ear discharge, sinus pain, sneezing, tinnitus, trouble swallowing and voice change.   Eyes: Negative.   Respiratory: Negative for cough, chest tightness, shortness of breath and wheezing.   Cardiovascular: Negative.   Gastrointestinal: Negative.   Neurological: Negative.       Objective:   Physical Exam  Constitutional: She is oriented to person, place, and time. She appears well-developed and well-nourished.  HENT:  Head: Normocephalic and atraumatic.  Oropharynx with redness, nose with swollen turbinates, TMs bulging bilaterally  Eyes: EOM are normal.  Neck: Normal range of motion. No thyromegaly present.  Cardiovascular: Normal rate and regular rhythm.  Pulmonary/Chest: Effort normal and breath sounds normal. No respiratory distress. She has no wheezes. She has no rales.  Abdominal: Soft.  Musculoskeletal: She exhibits tenderness.  Lymphadenopathy:    She has no cervical adenopathy.  Neurological: She is alert and oriented to person, place, and time.  Skin: Skin is warm and dry.   Vitals:   06/09/18 0958  BP: 122/90  Pulse: (!) 111  Temp: 98.6 F (37 C)  TempSrc: Oral  SpO2: 97%  Weight: 185 lb (83.9 kg)  Height: 5\' 4"   (1.626 m)   POC strep test:     Assessment & Plan:

## 2018-06-09 NOTE — Assessment & Plan Note (Addendum)
Suspect bacterial sinus infection and rx for augmentin. Strep test negative POC.

## 2018-10-14 ENCOUNTER — Ambulatory Visit: Payer: Self-pay | Admitting: *Deleted

## 2018-10-14 MED ORDER — OSELTAMIVIR PHOSPHATE 75 MG PO CAPS: 75.0000 mg | ORAL_CAPSULE | Freq: Two times a day (BID) | ORAL | 0 refills | Status: DC

## 2018-10-14 NOTE — Telephone Encounter (Signed)
Summary: Call back    Husband is calling stating that he had the flu and now his wife is having a cough that hurts her chest when she coughs, it started yesterday. Please advise     Patient's husband is calling to report he and children have tested positive for flu- wife is experiencing symptoms- they are requesting Tamiflu for her.  Patient is having fever, cough- chest pain,sore throat Reason for Disposition . [1] Influenza EXPOSURE within last 48 hours (2 days) AND [2] NOT HIGH RISK AND [3] strongly requests antiviral medication  Answer Assessment - Initial Assessment Questions 1. TYPE of EXPOSURE: "How were you exposed?" (e.g., close contact, not a close contact)     Close contact- husband and children have flu 2. DATE of EXPOSURE: "When did the exposure occur?" (e.g., hour, days, weeks)     Husband tested positive Monday, daughter tested positive today 3. PREGNANCY: "Is there any chance you are pregnant?" "When was your last menstrual period?"     No- LMP- 09/21/18 4. HIGH RISK for COMPLICATIONS: "Do you have any heart or lung problems? Do you have a weakened immune system?" (e.g., CHF, COPD, asthma, HIV positive, chemotherapy, renal failure, diabetes mellitus, sickle cell anemia)     no 5. SYMPTOMS: "Do you have any symptoms?" (e.g., cough, fever, sore throat, difficulty breathing).     Fever, vomiting, chest pain- hard to breath, hurts to cough and talk  Protocols used: INFLUENZA EXPOSURE-A-AH

## 2018-10-14 NOTE — Addendum Note (Signed)
Addended by: Hillard Danker A on: 10/14/2018 01:49 PM   Modules accepted: Orders

## 2018-10-14 NOTE — Telephone Encounter (Signed)
Sent in tamiflu 1 pill twice a day for her to take.

## 2018-10-14 NOTE — Telephone Encounter (Signed)
Husband informed medication was sent in

## 2018-10-15 IMAGING — US US SOFT TISSUE HEAD/NECK
1 series · 14 of 18 positions shown · non-contrast
Comparison: None.

CLINICAL DATA: 35-year-old female with left lower neck swelling.

EXAM:
ULTRASOUND OF HEAD/NECK SOFT TISSUES
TECHNIQUE: Ultrasound examination of the head and neck soft tissues was
performed in the area of clinical concern.

[Series 1: us soft tissue head/neck · 0.08mm/px · 14 of 18 slices shown]
[im 1/18]
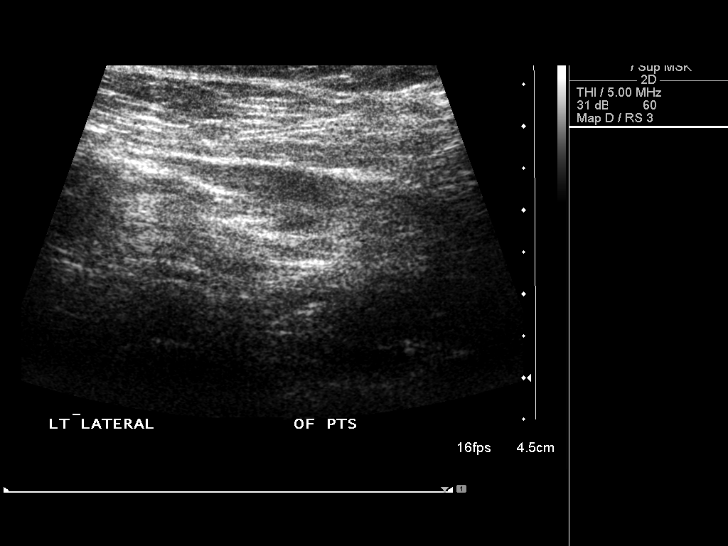
[im 2/18]
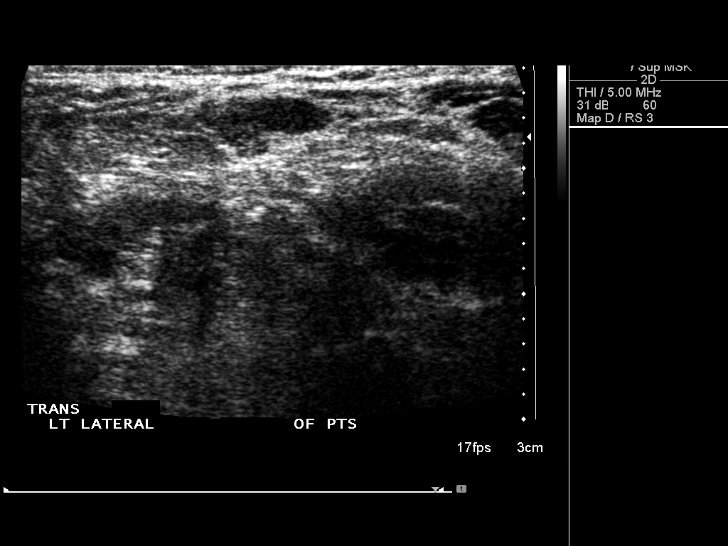
[im 4/18]
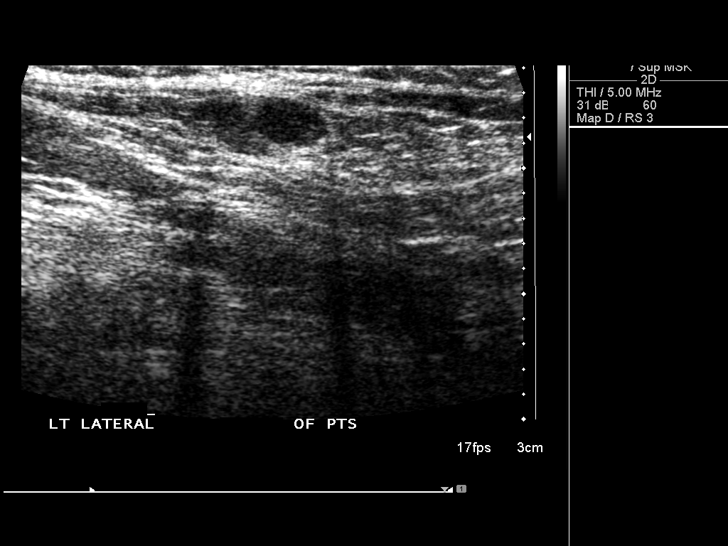
[im 5/18]
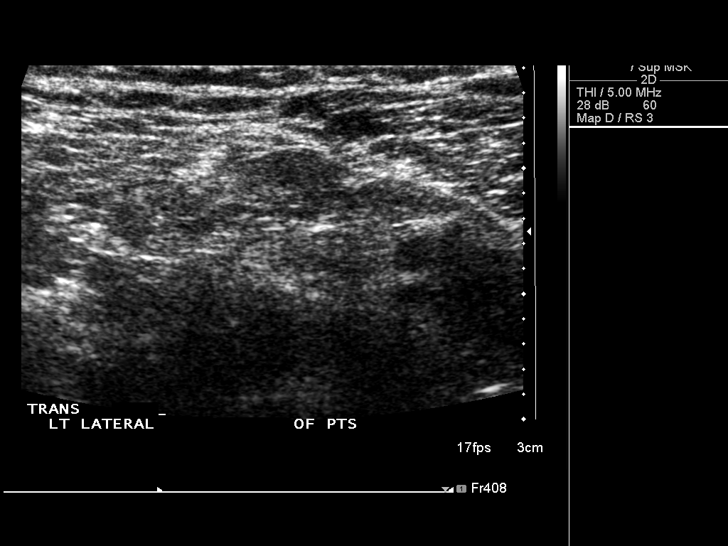
[im 6/18]
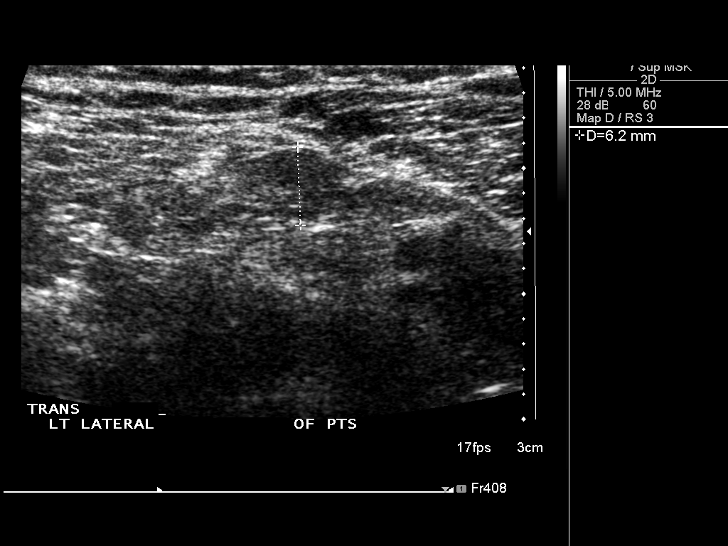
[im 8/18]
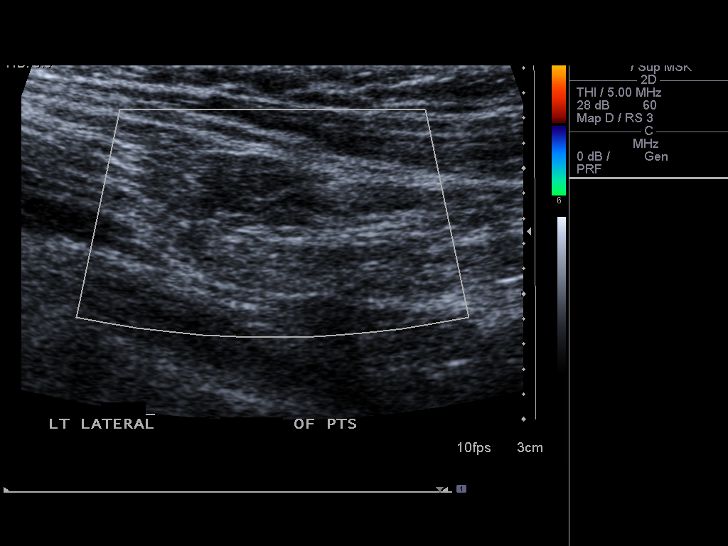
[im 9/18]
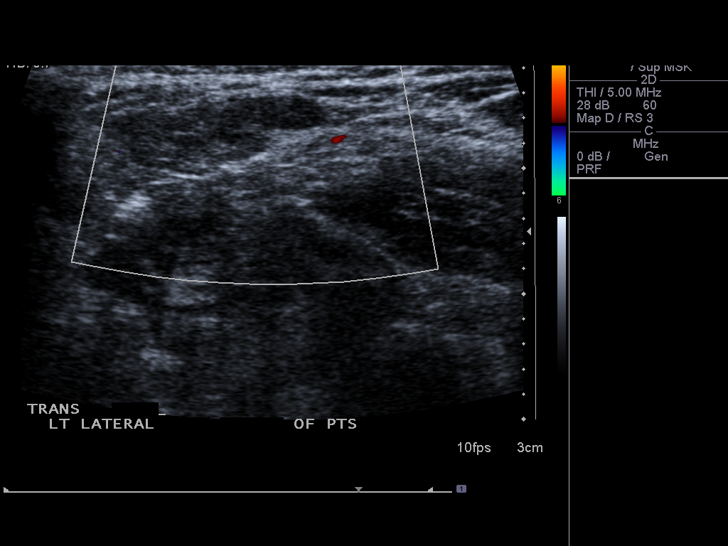
[im 10/18]
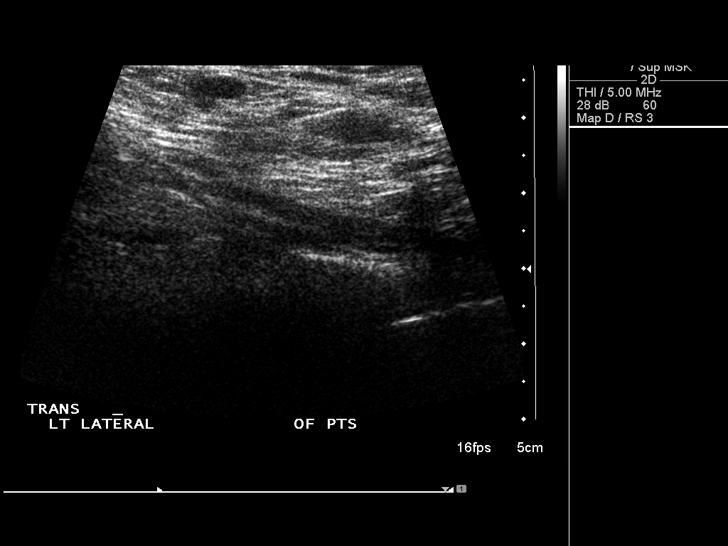
[im 11/18]
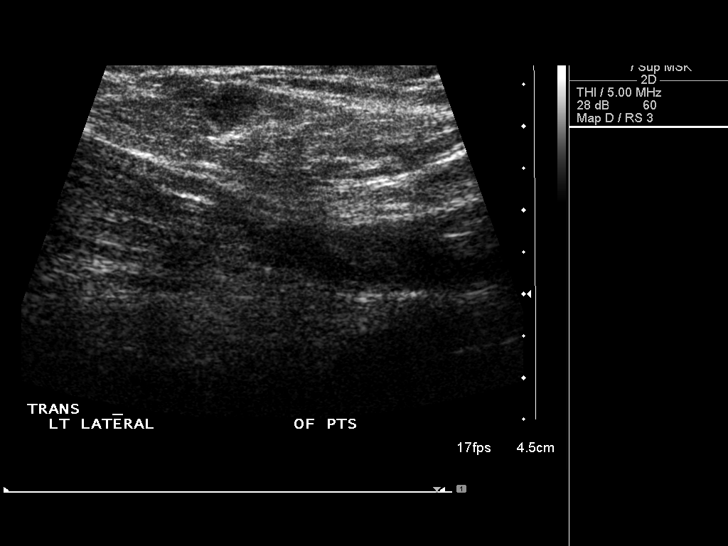
[im 13/18]
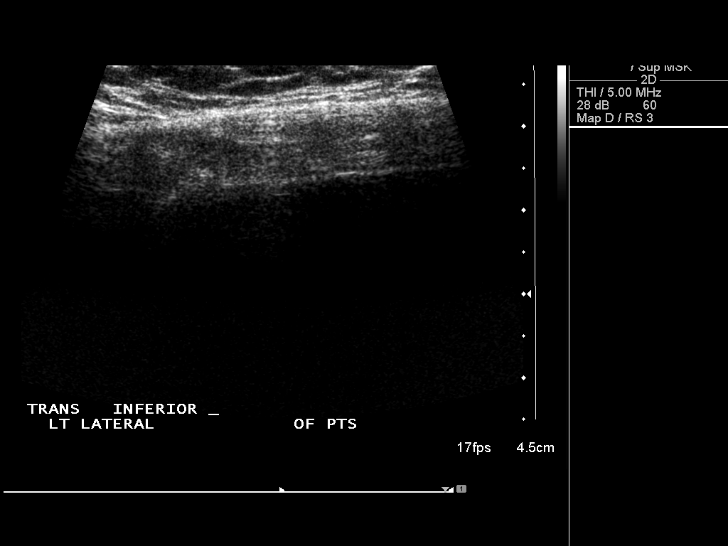
[im 14/18]
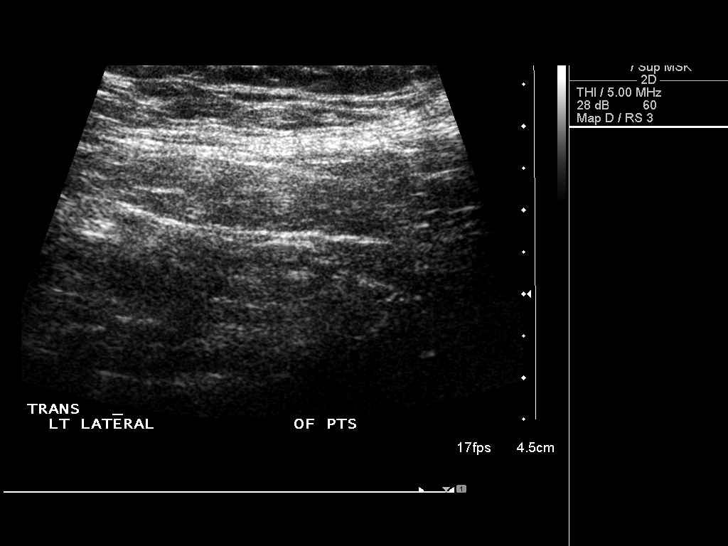
[im 15/18]
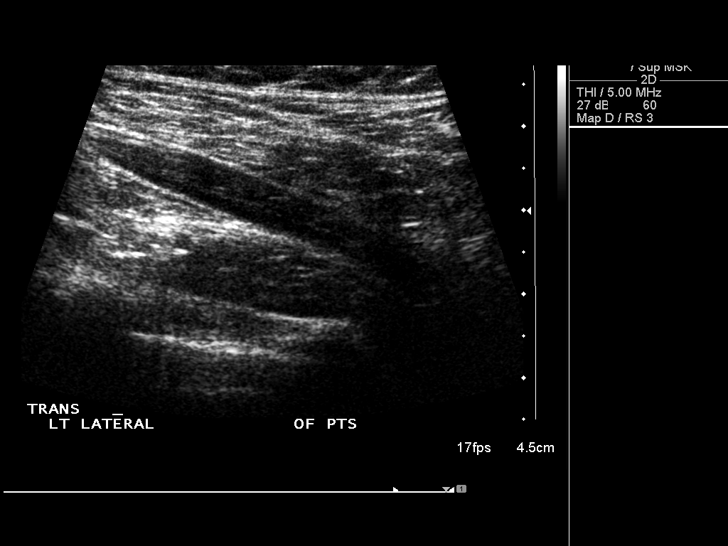
[im 17/18]
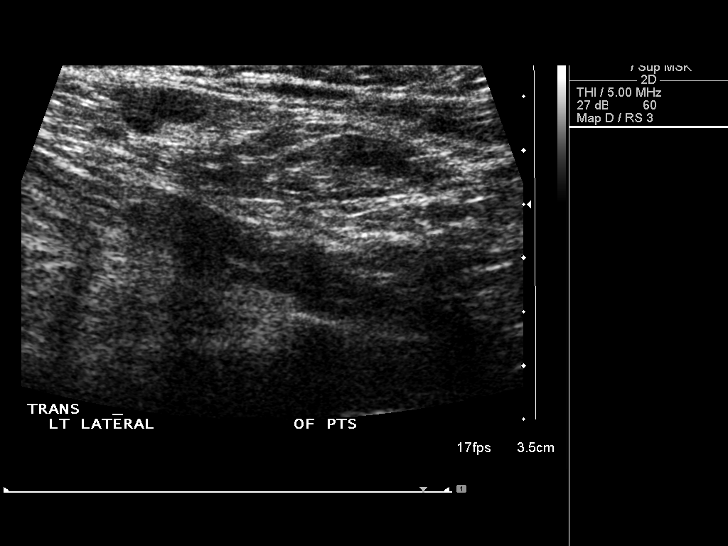
[im 18/18]
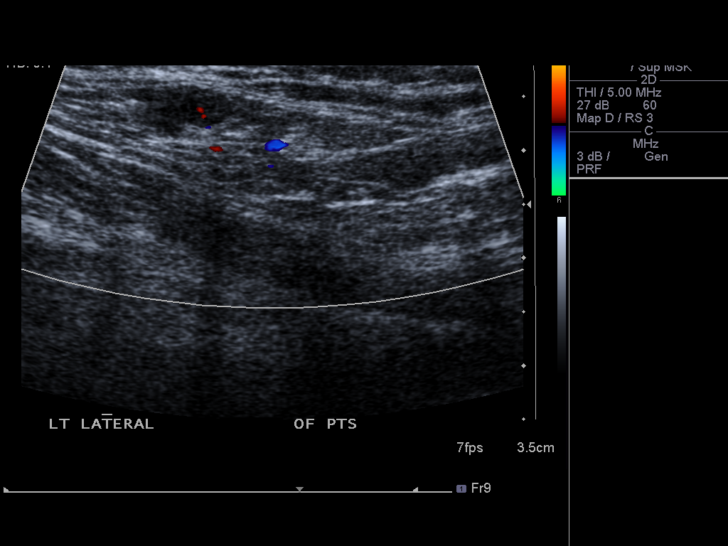

[14 of 18 positions shown; findings below may reference images not displayed]

FINDINGS: Sonographic interrogation of the region of clinical concern
demonstrates 2 hypoechoic soft tissue nodules with echogenic fatty
hila. On color Doppler imaging there is a suggestion of a vascular
pedicle entering the hilum. The lymph nodes are normal in size and
morphologically unremarkable.
IMPRESSION: The palpable abnormality corresponds with 2 small morphologically
normal lymph nodes.

## 2018-11-04 ENCOUNTER — Other Ambulatory Visit: Payer: Self-pay

## 2018-11-04 ENCOUNTER — Encounter: Payer: Self-pay | Admitting: Internal Medicine

## 2018-11-04 ENCOUNTER — Ambulatory Visit: Payer: BLUE CROSS/BLUE SHIELD | Admitting: Internal Medicine

## 2018-11-04 DIAGNOSIS — J3089 Other allergic rhinitis: Secondary | ICD-10-CM | POA: Diagnosis not present

## 2018-11-04 MED ORDER — BENZONATATE 200 MG PO CAPS: 200.0000 mg | ORAL_CAPSULE | Freq: Three times a day (TID) | ORAL | 0 refills | Status: DC | PRN

## 2018-11-04 NOTE — Assessment & Plan Note (Signed)
Likely the cause of the cough. Rx for tessalon perles and advised to take zyrtec.

## 2018-11-04 NOTE — Patient Instructions (Signed)
We will have you start taking zyrtec for the next 1-2 weeks to help the drainage.

## 2018-11-04 NOTE — Progress Notes (Signed)
   Subjective:   Patient ID: Caitlin Bryant, female    DOB: 20-Nov-1980, 38 y.o.   MRN: [id number]  HPI The patient is a 38 YO female coming in for cough ongoing for about 1 month. She did have the flu back at the end of February. The main symptoms disappeared. She does still have some non-productive cough. She normally gets allergies and feels that those are present now. She denies fevers or chills. Denies SOB. Some chest tightness from cough. Denies headaches or ear pain. No sinus pain. Not taking anything for this currently.   Review of Systems  Constitutional: Negative for activity change, appetite change, chills, fatigue, fever and unexpected weight change.  HENT: Positive for congestion, postnasal drip and rhinorrhea. Negative for ear discharge, ear pain, sinus pressure, sinus pain, sneezing, sore throat, tinnitus, trouble swallowing and voice change.   Eyes: Negative.   Respiratory: Positive for cough. Negative for chest tightness, shortness of breath and wheezing.   Cardiovascular: Negative.   Gastrointestinal: Negative.   Musculoskeletal: Negative.   Neurological: Negative.     Objective:  Physical Exam Constitutional:      Appearance: She is well-developed.  HENT:     Head: Normocephalic and atraumatic.     Comments: Oropharynx with redness and clear drainage, nose with swollen turbinates, TMs normal bilaterally.  Neck:     Musculoskeletal: Normal range of motion.     Thyroid: No thyromegaly.  Cardiovascular:     Rate and Rhythm: Normal rate and regular rhythm.  Pulmonary:     Effort: Pulmonary effort is normal. No respiratory distress.     Breath sounds: Normal breath sounds. No wheezing or rales.  Abdominal:     Palpations: Abdomen is soft.  Musculoskeletal:        General: Tenderness present.  Lymphadenopathy:     Cervical: No cervical adenopathy.  Skin:    General: Skin is warm and dry.  Neurological:     Mental Status: She is alert and oriented to person,  place, and time.     Vitals:   11/04/18 0926  BP: 110/80  Pulse: 81  Temp: 98.2 F (36.8 C)  TempSrc: Oral  SpO2: 98%  Weight: 185 lb (83.9 kg)  Height: 5\' 4"  (1.626 m)    Assessment & Plan:

## 2019-01-02 ENCOUNTER — Ambulatory Visit (INDEPENDENT_AMBULATORY_CARE_PROVIDER_SITE_OTHER): Payer: BLUE CROSS/BLUE SHIELD | Admitting: Internal Medicine

## 2019-01-02 ENCOUNTER — Encounter: Payer: Self-pay | Admitting: Internal Medicine

## 2019-01-02 DIAGNOSIS — J0140 Acute pansinusitis, unspecified: Secondary | ICD-10-CM | POA: Diagnosis not present

## 2019-01-02 MED ORDER — AMOXICILLIN-POT CLAVULANATE 875-125 MG PO TABS: 1.0000 | ORAL_TABLET | Freq: Two times a day (BID) | ORAL | 0 refills | Status: DC

## 2019-01-02 NOTE — Progress Notes (Signed)
Virtual Visit via Video Note  I connected with Caitlin Bryant on 01/02/19 at  1:20 PM EDT by a video enabled telemedicine application and verified that I am speaking with the correct person using two identifiers.  The patient and the provider were at separate locations throughout the entire encounter.   I discussed the limitations of evaluation and management by telemedicine and the availability of in person appointments. The patient expressed understanding and agreed to proceed.  History of Present Illness: The patient is a 38 y.o. female with visit for sinus problems. She has been struggling with this since mid-March. Had a visit back then and advised to start taking zyrtec. She has been having persistent and worsening sinus pain/pressure. Some tooth pain as well. Some headaches and nasal drainage. Cough is almost gone. Has been taking allergy medication since that time. Denies fevers or chills or cough or SOB. Overall it is worsening. Has tried otc allergy medication and tessalon perles.   Observations/Objective: Appearance: normal, breathing appears normal, casual grooming, abdomen does not appear distended, throat with clear drainage, pain to self-palpation to frontal sinuses, mental status is A and O times 3  Assessment and Plan: See problem oriented charting  Follow Up Instructions: rx augmentin 10 day course, continue zyrtec  I discussed the assessment and treatment plan with the patient. The patient was provided an opportunity to ask questions and all were answered. The patient agreed with the plan and demonstrated an understanding of the instructions.   The patient was advised to call back or seek an in-person evaluation if the symptoms worsen or if the condition fails to improve as anticipated.  Myrlene Broker, MD

## 2019-01-02 NOTE — Assessment & Plan Note (Signed)
Rx augmentin and continue zyrtec.

## 2019-01-17 ENCOUNTER — Telehealth: Payer: Self-pay | Admitting: Internal Medicine

## 2019-01-17 MED ORDER — MONTELUKAST SODIUM 10 MG PO TABS: 10.0000 mg | ORAL_TABLET | Freq: Every day | ORAL | 3 refills | Status: DC

## 2019-01-17 NOTE — Telephone Encounter (Signed)
Patient informed rx has been sent stated understanding

## 2019-01-17 NOTE — Telephone Encounter (Signed)
Sent in singulair to try at night time to help with sinuses.

## 2019-01-17 NOTE — Telephone Encounter (Signed)
Patient states she seen Dr. Okey Dupre a couple weeks ago.  States she is still stuffy.  Would like to know if Dr. Okey Dupre could try something else.  States that Dr. Okey Dupre told her at the last appt if what she originally prescribed did not work she would try something else.

## 2019-05-11 ENCOUNTER — Telehealth: Payer: Self-pay

## 2019-05-11 NOTE — Telephone Encounter (Signed)
Patient informed of MD response and stated understanding  

## 2019-05-11 NOTE — Telephone Encounter (Signed)
General goal of safe weight loss is no more than 1-2 pounds per week.

## 2019-05-11 NOTE — Telephone Encounter (Signed)
Copied from [place] [phone]. Topic: General - Inquiry >> May 11, 2019  7:50 AM [name] wrote: Reason for CRM: pt states she has been dieting over the summer and she has lost 32 lbs since June.  Pt states she has been eating good, exercising (walking) ,cut out sodas, sugars, and breads. Her mom wanted her to call her dr and let her know this, to make sure she is not losing too much weight.

## 2019-06-09 ENCOUNTER — Other Ambulatory Visit: Payer: Self-pay

## 2019-06-09 ENCOUNTER — Encounter: Payer: Self-pay | Admitting: Internal Medicine

## 2019-06-09 ENCOUNTER — Ambulatory Visit (INDEPENDENT_AMBULATORY_CARE_PROVIDER_SITE_OTHER): Payer: BC Managed Care – PPO | Admitting: Internal Medicine

## 2019-06-09 VITALS — BP 110/80 | HR 73 | Temp 97.9°F | Ht 64.0 in | Wt 147.0 lb

## 2019-06-09 DIAGNOSIS — Z23 Encounter for immunization: Secondary | ICD-10-CM

## 2019-06-09 DIAGNOSIS — M545 Low back pain, unspecified: Secondary | ICD-10-CM

## 2019-06-09 LAB — POCT URINALYSIS DIPSTICK
Bilirubin, UA: NEGATIVE
Blood, UA: POSITIVE
Glucose, UA: NEGATIVE
Leukocytes, UA: NEGATIVE
Nitrite, UA: NEGATIVE
Protein, UA: POSITIVE — AB
Spec Grav, UA: >=1.03 — AB (ref 1.010–1.025)
Urobilinogen, UA: 0.2 E.U./dL
pH, UA: 6 (ref 5.0–8.0)

## 2019-06-09 NOTE — Assessment & Plan Note (Signed)
U/A in the office with some blood. Offered CT scan to check for kidney stones but she is not having much pain currently and declines. Seeing gyn next month and asked to mention to them to recheck urine then.

## 2019-06-09 NOTE — Patient Instructions (Signed)
Drink plenty of fluids

## 2019-06-09 NOTE — Progress Notes (Signed)
   Subjective:   Patient ID: Caitlin Bryant, female    DOB: 10/16/1980, 38 y.o.   MRN: [id number]  HPI The patient is a 38 YO female coming in for concerns about pain in the mid back in the kidney area. She is also having some extra discharge. Denies odor to urine or itching or burning. Has also noticed some weight loss with the dietary changes she has made and exercise routine. Denies fevers or chills. Family history of kidney stones and she was thinking about that. The pain in the back does come and go and she was not sure if it could be exercise.   Review of Systems  Constitutional: Negative.   HENT: Negative.   Eyes: Negative.   Respiratory: Negative for cough, chest tightness and shortness of breath.   Cardiovascular: Negative for chest pain, palpitations and leg swelling.  Gastrointestinal: Negative for abdominal distention, abdominal pain, constipation, diarrhea, nausea and vomiting.  Genitourinary: Positive for flank pain. Negative for difficulty urinating, dysuria, enuresis, frequency, hematuria, pelvic pain, urgency, vaginal bleeding and vaginal discharge.  Skin: Negative.   Neurological: Negative.   Psychiatric/Behavioral: Negative.     Objective:  Physical Exam Constitutional:      Appearance: She is well-developed.  HENT:     Head: Normocephalic and atraumatic.  Neck:     Musculoskeletal: Normal range of motion.  Cardiovascular:     Rate and Rhythm: Normal rate and regular rhythm.  Pulmonary:     Effort: Pulmonary effort is normal. No respiratory distress.     Breath sounds: Normal breath sounds. No wheezing or rales.  Abdominal:     General: Bowel sounds are normal. There is no distension.     Palpations: Abdomen is soft.     Tenderness: There is no abdominal tenderness. There is no rebound.  Skin:    General: Skin is warm and dry.  Neurological:     Mental Status: She is alert and oriented to person, place, and time.     Coordination: Coordination normal.      Vitals:   06/09/19 1528  BP: 110/80  Pulse: 73  Temp: 97.9 F (36.6 C)  TempSrc: Oral  SpO2: 99%  Weight: 147 lb (66.7 kg)  Height: 5\' 4"  (1.626 m)    Assessment & Plan:  Flu shot given at visit

## 2019-06-16 ENCOUNTER — Ambulatory Visit (INDEPENDENT_AMBULATORY_CARE_PROVIDER_SITE_OTHER)
Admission: RE | Admit: 2019-06-16 | Discharge: 2019-06-16 | Disposition: A | Discharge status: Home / Self Care | Payer: BC Managed Care – PPO | Source: Ambulatory Visit | Attending: Internal Medicine | Admitting: Internal Medicine

## 2019-06-16 ENCOUNTER — Telehealth: Payer: Self-pay | Admitting: Internal Medicine

## 2019-06-16 ENCOUNTER — Other Ambulatory Visit: Payer: Self-pay

## 2019-06-16 DIAGNOSIS — R109 Unspecified abdominal pain: Secondary | ICD-10-CM | POA: Diagnosis not present

## 2019-06-16 NOTE — Telephone Encounter (Signed)
Patient's husband called stating that the patient is still having back pain and would like to continue with the imaging order. Please advise.

## 2019-06-16 NOTE — Telephone Encounter (Signed)
Ordered CT to check for kidney stones.

## 2019-06-16 NOTE — Telephone Encounter (Signed)
Cecille Rubin calling to schedule

## 2019-06-17 ENCOUNTER — Other Ambulatory Visit: Payer: Self-pay | Admitting: Internal Medicine

## 2020-04-30 ENCOUNTER — Other Ambulatory Visit: Payer: Self-pay

## 2020-04-30 ENCOUNTER — Telehealth: Payer: Self-pay | Admitting: Internal Medicine

## 2020-04-30 ENCOUNTER — Telehealth (INDEPENDENT_AMBULATORY_CARE_PROVIDER_SITE_OTHER): Payer: Self-pay | Admitting: Family

## 2020-04-30 DIAGNOSIS — J069 Acute upper respiratory infection, unspecified: Secondary | ICD-10-CM

## 2020-04-30 NOTE — Telephone Encounter (Signed)
covid test done at [facility], results negative  Please call in an antibiotic

## 2020-04-30 NOTE — Progress Notes (Signed)
  Caitlin Bryant is a 39 y.o. female with the following history as recorded in Epic[employer]:  Patient Active Problem List   Diagnosis Date Noted  . Acute bilateral low back pain without sciatica 06/09/2019  . Sore throat 06/09/2018  . Left knee pain 04/20/2018  . Impacted cerumen of right ear 04/15/2017  . Acute non-recurrent pansinusitis 10/09/2016  . Neck swelling 08/07/2016  . Mouth sores 07/24/2015  . Depression 07/24/2015  . TMJ dysfunction 07/03/2015  . Migraine 05/10/2015  . Ankle instability, left 02/22/2015  . Anxiety state 01/31/2015  . Allergic rhinitis 01/31/2015    Current Outpatient Medications  Medication Sig Dispense Refill  . cetirizine (ZYRTEC) 10 MG tablet Take 10 mg by mouth daily.    . Diclofenac Sodium (PENNSAID) 2 % SOLN Place 1 application onto the skin 2 (two) times daily. 1 Bottle 3  . fluticasone (FLONASE) 50 MCG/ACT nasal spray Place into both nostrils daily.     No current facility-administered medications for this visit.    Allergies: Sumatriptan and Sulfa antibiotics  Past Medical History:  Diagnosis Date  . MRSA (methicillin resistant staph aureus) culture positive     Past Surgical History:  Procedure Laterality Date  . CESAREAN SECTION  2004 and 2008   x 2    Family History  Problem Relation Age of Onset  . Hypertension Mother   . Diabetes Mother   . Osteoarthritis Mother   . Fibromyalgia Mother   . Heart attack Father        Defibrillator    Social History   Tobacco Use  . Smoking status: Never Smoker  . Smokeless tobacco: Never Used  Substance Use Topics  . Alcohol use: No    Subjective:   I connected with Caitlin Bryant on 04/30/20 at  9:20 AM EDT by a video enabled telemedicine application and verified that I am speaking with the correct person using two identifiers.   I discussed the limitations of evaluation and management by telemedicine and the availability of in person appointments. The patient expressed  understanding and agreed to proceed. Provider in office/ patient is at home; provider and patient are only 2 people on video call.   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  General: Well developed, well nourished, in no acute distress  Head: Normocephalic and atraumatic  Lungs: Respirations unlabored;  Neurologic: Alert and oriented; speech intact; face symmetrical;   Assessment:  1. Viral URI with cough     Plan:  Presentation is concerning for COVID; low suspicion for sinus infection based on presentation; she is encouraged to get her COVID test done today and stay home until results are available; symptomatic treatment discussed with patient as well; follow-up to be determined.   No follow-ups on file.  No orders of the defined types were placed in this encounter.   Requested Prescriptions    No prescriptions requested or ordered in this encounter

## 2020-05-01 ENCOUNTER — Other Ambulatory Visit: Payer: Self-pay | Admitting: Family

## 2020-05-01 MED ORDER — AZITHROMYCIN 250 MG PO TABS: ORAL_TABLET | ORAL | 0 refills | Status: DC

## 2020-05-02 ENCOUNTER — Encounter: Payer: Self-pay | Admitting: Family

## 2020-05-08 ENCOUNTER — Telehealth: Payer: Self-pay | Admitting: Unknown Physician Specialty

## 2020-05-08 NOTE — Telephone Encounter (Signed)
Called to Discuss with patient about Covid symptoms and the use of the monoclonal antibody infusion for those with mild to moderate Covid symptoms and at a high risk of hospitalization.     Pt appears to qualify for this infusion due to co-morbid conditions and/or a member of an at-risk group in accordance with the FDA Emergency Use Authorization.    Unable to reach pt    

## 2020-05-15 ENCOUNTER — Ambulatory Visit: Payer: Self-pay

## 2020-05-15 ENCOUNTER — Ambulatory Visit
Admission: EM | Admit: 2020-05-15 | Discharge: 2020-05-15 | Disposition: A | Discharge status: Home / Self Care | Payer: 59 | Attending: Emergency Medicine | Admitting: Emergency Medicine

## 2020-05-15 ENCOUNTER — Other Ambulatory Visit: Payer: Self-pay

## 2020-05-15 DIAGNOSIS — R5383 Other fatigue: Secondary | ICD-10-CM | POA: Diagnosis not present

## 2020-05-15 DIAGNOSIS — U071 COVID-19: Secondary | ICD-10-CM

## 2020-05-15 DIAGNOSIS — R05 Cough: Secondary | ICD-10-CM

## 2020-05-15 DIAGNOSIS — R058 Other specified cough: Secondary | ICD-10-CM

## 2020-05-15 MED ORDER — BENZONATATE 200 MG PO CAPS: 200.0000 mg | ORAL_CAPSULE | Freq: Three times a day (TID) | ORAL | 0 refills | Status: AC | PRN

## 2020-05-15 MED ORDER — ONDANSETRON 4 MG PO TBDP: 4.0000 mg | ORAL_TABLET | Freq: Three times a day (TID) | ORAL | 0 refills | Status: DC | PRN

## 2020-05-15 MED ORDER — PREDNISONE 10 MG PO TABS: ORAL_TABLET | ORAL | 0 refills | Status: DC

## 2020-05-15 MED ORDER — HYDROCODONE-HOMATROPINE 5-1.5 MG/5ML PO SYRP: 5.0000 mL | ORAL_SOLUTION | Freq: Every evening | ORAL | 0 refills | Status: DC | PRN

## 2020-05-15 NOTE — ED Provider Notes (Signed)
[facility]    CSN: [id number] Arrival date & time: 05/15/20  1451      History   Chief Complaint Chief Complaint  Patient presents with  . Cough    x 2 weeks  . Headache    x 2 weeks    HPI Caitlin Bryant is a 39 y.o. female presenting today for evaluation of a cough.  Patient tested positive for Covid approximately 2 weeks ago.  She continues to have a cough, but overall symptoms have improved.  She denies any continued fevers.  Does have continued fatigue which is more prominent in the afternoon.  Intermittent headaches.  Denies chest pain, slight shortness of breath.  Completed course of azithromycin earlier in course of Covid.  HPI  Past Medical History:  Diagnosis Date  . MRSA (methicillin resistant staph aureus) culture positive     Patient Active Problem List   Diagnosis Date Noted  . Acute bilateral low back pain without sciatica 06/09/2019  . Sore throat 06/09/2018  . Left knee pain 04/20/2018  . Impacted cerumen of right ear 04/15/2017  . Acute non-recurrent pansinusitis 10/09/2016  . Neck swelling 08/07/2016  . Mouth sores 07/24/2015  . Depression 07/24/2015  . TMJ dysfunction 07/03/2015  . Migraine 05/10/2015  . Ankle instability, left 02/22/2015  . Anxiety state 01/31/2015  . Allergic rhinitis 01/31/2015    Past Surgical History:  Procedure Laterality Date  . CESAREAN SECTION  2004 and 2008   x 2    OB History   No obstetric history on file.      Home Medications    Prior to Admission medications   Medication Sig Start Date End Date Taking? Authorizing Provider  cetirizine (ZYRTEC) 10 MG tablet Take 10 mg by mouth daily.   Yes [provider]  fluticasone (FLONASE) 50 MCG/ACT nasal spray Place into both nostrils daily.   Yes [provider]  benzonatate (TESSALON) 200 MG capsule Take 1 capsule (200 mg total) by mouth 3 (three) times daily as needed for up to 7 days for cough. 05/15/20 05/22/20  Emran Molzahn,  Jamonta Goerner C, PA-C  HYDROcodone-homatropine (HYCODAN) 5-1.5 MG/5ML syrup Take 5 mLs by mouth at bedtime as needed for cough. 05/15/20   Sky Borboa C, PA-C  predniSONE (DELTASONE) 10 MG tablet Begin with 6 tabs on day 1, 5 tab on day 2, 4 tab on day 3, 3 tab on day 4, 2 tab on day 5, 1 tab on day 6-take with food 05/15/20   Everest Hacking, Sparkill C, PA-C    Family History Family History  Problem Relation Age of Onset  . Hypertension Mother   . Diabetes Mother   . Osteoarthritis Mother   . Fibromyalgia Mother   . Heart attack Father        Defibrillator    Social History Social History   Tobacco Use  . Smoking status: Never Smoker  . Smokeless tobacco: Never Used  Vaping Use  . Vaping Use: Never used  Substance Use Topics  . Alcohol use: No  . Drug use: No     Allergies   Sumatriptan and Sulfa antibiotics   Review of Systems Review of Systems  Constitutional: Positive for fatigue. Negative for activity change, appetite change, chills and fever.  HENT: Positive for congestion and rhinorrhea. Negative for ear pain, sinus pressure, sore throat and trouble swallowing.   Eyes: Negative for discharge and redness.  Respiratory: Positive for cough. Negative for chest tightness and shortness of breath.  Cardiovascular: Negative for chest pain.  Gastrointestinal: Positive for nausea. Negative for abdominal pain, diarrhea and vomiting.  Musculoskeletal: Negative for myalgias.  Skin: Negative for rash.  Neurological: Negative for dizziness, light-headedness and headaches.     Physical Exam Triage Vital Signs ED Triage Vitals  Enc Vitals Group     BP 05/15/20 1521 113/80     Pulse Rate 05/15/20 1521 73     Resp 05/15/20 1521 18     Temp 05/15/20 1521 98.4 F (36.9 C)     Temp Source 05/15/20 1521 Oral     SpO2 05/15/20 1521 98 %     Weight --      Height --      Head Circumference --      Peak Flow --      Pain Score 05/15/20 1523 0     Pain Loc --      Pain Edu? --       Excl. in [place]? --    No data found.  Updated Vital Signs BP 113/80 (BP Location: Right Arm)   Pulse 73   Temp 98.4 F (36.9 C) (Oral)   Resp 18   SpO2 98%   Visual Acuity Right Eye Distance:   Left Eye Distance:   Bilateral Distance:    Right Eye Near:   Left Eye Near:    Bilateral Near:     Physical Exam Vitals and nursing note reviewed.  Constitutional:      Appearance: She is well-developed.     Comments: No acute distress  HENT:     Head: Normocephalic and atraumatic.     Ears:     Comments: Bilateral ears without tenderness to palpation of external auricle, tragus and mastoid, EAC's without erythema or swelling, TM's with good bony landmarks and cone of light. Non erythematous.    Nose: Nose normal.     Mouth/Throat:     Comments: Oral mucosa pink and moist, no tonsillar enlargement or exudate. Posterior pharynx patent and nonerythematous, no uvula deviation or swelling. Normal phonation. Eyes:     Conjunctiva/sclera: Conjunctivae normal.  Cardiovascular:     Rate and Rhythm: Normal rate.  Pulmonary:     Effort: Pulmonary effort is normal. No respiratory distress.     Comments: Breathing comfortably at rest, CTABL, no wheezing, rales or other adventitious sounds auscultated  Occasional dry cough in room Abdominal:     General: There is no distension.  Musculoskeletal:        General: Normal range of motion.     Cervical back: Neck supple.  Skin:    General: Skin is warm and dry.  Neurological:     Mental Status: She is alert and oriented to person, place, and time.      UC Treatments / Results  Labs (all labs ordered are listed, but only abnormal results are displayed) Labs Reviewed - No data to display  EKG   Radiology No results found.  Procedures Procedures (including critical care time)  Medications Ordered in UC Medications - No data to display  Initial Impression / Assessment and Plan / UC Course  I have reviewed the triage vital signs  and the nursing notes.  Pertinent labs & imaging results that were available during my care of the patient were reviewed by me and considered in my medical decision making (see chart for details).    COVID-19 infection recently Has met criteria to exit quarantine.  Continue symptomatic and supportive care.  Is already  completed course of azithromycin to cover atypicals.  No recent worsening.  Lungs clear to auscultation.  Will try course of prednisone along with continued medicines for cough.  Discussed strict return precautions. Patient verbalized understanding and is agreeable with plan.  Final Clinical Impressions(s) / UC Diagnoses   Final diagnoses:  Post-viral cough syndrome  Fatigue, unspecified type  COVID-19 virus infection     Discharge Instructions     Begin course of prednisone-begin with 6 tablets on day 1, decrease by 1 tablet each day until complete-6, 5, 4, 3, 2, 1-take with food and in the morning if you are able Continue Mucinex Tessalon/benzonatate every 8 hours as needed for cough during the day Hycodan at bedtime-will cause drowsiness, do not drive or work after taking  Please follow-up if symptoms do not improving or worsening   ED Prescriptions    Medication Sig Dispense Auth. Provider   predniSONE (DELTASONE) 10 MG tablet Begin with 6 tabs on day 1, 5 tab on day 2, 4 tab on day 3, 3 tab on day 4, 2 tab on day 5, 1 tab on day 6-take with food 21 tablet Mozell Haber C, PA-C   benzonatate (TESSALON) 200 MG capsule Take 1 capsule (200 mg total) by mouth 3 (three) times daily as needed for up to 7 days for cough. 28 capsule Clarene Curran C, PA-C   HYDROcodone-homatropine (HYCODAN) 5-1.5 MG/5ML syrup Take 5 mLs by mouth at bedtime as needed for cough. 75 mL Caley Ciaramitaro, [place] C, PA-C     I have reviewed the PDMP during this encounter.   Janith Lima, [place] 05/15/20 1605

## 2020-05-15 NOTE — ED Triage Notes (Signed)
Pt states she had a positive covid test 10 days ago and is still experiencing coughing and lethargy as well as headaches. Pt is aox4 and is ambulatory.

## 2020-05-15 NOTE — Discharge Instructions (Signed)
Begin course of prednisone-begin with 6 tablets on day 1, decrease by 1 tablet each day until complete-6, 5, 4, 3, 2, 1-take with food and in the morning if you are able Continue Mucinex Tessalon/benzonatate every 8 hours as needed for cough during the day Hycodan at bedtime-will cause drowsiness, do not drive or work after taking  Please follow-up if symptoms do not improving or worsening

## 2020-12-04 ENCOUNTER — Telehealth (INDEPENDENT_AMBULATORY_CARE_PROVIDER_SITE_OTHER): Payer: 59 | Admitting: Internal Medicine

## 2020-12-04 ENCOUNTER — Encounter: Payer: Self-pay | Admitting: Internal Medicine

## 2020-12-04 DIAGNOSIS — J069 Acute upper respiratory infection, unspecified: Secondary | ICD-10-CM | POA: Diagnosis not present

## 2020-12-04 DIAGNOSIS — J3089 Other allergic rhinitis: Secondary | ICD-10-CM | POA: Diagnosis not present

## 2020-12-04 MED ORDER — CEFDINIR 300 MG PO CAPS: 300.0000 mg | ORAL_CAPSULE | Freq: Two times a day (BID) | ORAL | 0 refills | Status: DC

## 2020-12-04 NOTE — Assessment & Plan Note (Addendum)
Acute.  Likely sinusitis and bronchitis.  Prescribed Omnicef for 10 days.  Over-the-counter meds Patient was asked to do a COVID-19 test at home.  Call if positive. Not pregnant

## 2020-12-04 NOTE — Assessment & Plan Note (Signed)
Use Zyrtec daily seasonally

## 2020-12-04 NOTE — Progress Notes (Signed)
Virtual Visit via Video Note  I connected with Caitlin Bryant on 12/04/20 at  1:40 PM EDT by a video enabled telemedicine application and verified that I am speaking with the correct person using two identifiers.   I discussed the limitations of evaluation and management by telemedicine and the availability of in person appointments. The patient expressed understanding and agreed to proceed.  I was located at our [facility] office. The patient was at home. There was no one else present in the visit.   History of Present Illness:  C/o  runny nose, cough, congestion, sinus HA, teeth hurt, ST, swollen glands. Sick since Friday.  Observations/Objective: The patient appears to be in no acute distress, looks tired  Assessment and Plan:  See my Assessment and Plan. Follow Up Instructions:    I discussed the assessment and treatment plan with the patient. The patient was provided an opportunity to ask questions and all were answered. The patient agreed with the plan and demonstrated an understanding of the instructions.   The patient was advised to call back or seek an in-person evaluation if the symptoms worsen or if the condition fails to improve as anticipated.  I provided face-to-face time during this encounter. We were at different locations.   Sonda Primes, MD

## 2021-02-12 ENCOUNTER — Other Ambulatory Visit: Payer: Self-pay

## 2021-02-12 ENCOUNTER — Ambulatory Visit (INDEPENDENT_AMBULATORY_CARE_PROVIDER_SITE_OTHER): Payer: 59 | Admitting: Nurse Practitioner

## 2021-02-12 ENCOUNTER — Encounter: Payer: Self-pay | Admitting: Nurse Practitioner

## 2021-02-12 VITALS — BP 96/64 | HR 77 | Temp 98.5°F | Ht 63.0 in | Wt 133.1 lb

## 2021-02-12 DIAGNOSIS — R5383 Other fatigue: Secondary | ICD-10-CM | POA: Diagnosis not present

## 2021-02-12 DIAGNOSIS — F411 Generalized anxiety disorder: Secondary | ICD-10-CM | POA: Diagnosis not present

## 2021-02-12 DIAGNOSIS — Z7689 Persons encountering health services in other specified circumstances: Secondary | ICD-10-CM | POA: Diagnosis not present

## 2021-02-12 MED ORDER — SERTRALINE HCL 25 MG PO TABS: 25.0000 mg | ORAL_TABLET | Freq: Every day | ORAL | 1 refills | Status: DC

## 2021-02-12 NOTE — Progress Notes (Signed)
New Patient Office Visit  Subjective:  Patient ID: Caitlin Bryant, female    DOB: 02-28-1981  Age: 40 y.o. MRN: [id number]  CC:  Chief Complaint  Patient presents with   New Patient (Initial Visit)    HPI Caitlin Bryant presents to establish new primary care provider.  She was previously a patient at [facility] internal medicine.  She needed to find a primary care provider that was closer to her home.  She reports a great deal of family stress over the last 1 year.  She states at home she has a 40 year old and a 61 year old daughter.  She has also taken on responsibility for her 37 year old nephew.  She states that her 6 year old daughter has special needs.  She suffers from severe anxiety, both social and situational.  The patient states that with the increase in family stress, her stress and anxiety has increased as well.  She finds that she worries a great deal of the time.  Her worry is causing intrusive thoughts, making it difficult to sleep at night.  She denies suicidal ideations or thoughts of harming herself or anyone else. She sees a gynecologist for well woman care. She does need to have routine fasting blood work done. She denies physical concerns or complaints today. She denies chest pain, chest pressure, or shortness of breath. He denies headaches or visual disturbances. He denies abdominal pain, nausea, vomiting, or changes in bowel or bladder habits.   . Past Medical History:  Diagnosis Date   MRSA (methicillin resistant staph aureus) culture positive     Past Surgical History:  Procedure Laterality Date   CESAREAN SECTION  2004 and 2008   x 2    Family History  Problem Relation Age of Onset   Hypertension Mother    Diabetes Mother    Osteoarthritis Mother    Fibromyalgia Mother    Heart attack Father        Defibrillator    Social History   Socioeconomic History   Marital status: Married    Spouse name: Not on file   Number of children: Not on  file   Years of education: Not on file   Highest education level: Not on file  Occupational History   Not on file  Tobacco Use   Smoking status: Never   Smokeless tobacco: Never  Vaping Use   Vaping Use: Never used  Substance and Sexual Activity   Alcohol use: Yes   Drug use: No   Sexual activity: Yes    Partners: Male  Other Topics Concern   Not on file  Social History Narrative   Not on file   Social Determinants of Health   Financial Resource Strain: Not on file  Food Insecurity: Not on file  Transportation Needs: Not on file  Physical Activity: Not on file  Stress: Not on file  Social Connections: Not on file  Intimate Partner Violence: Not on file    ROS Review of Systems  Constitutional:  Positive for fatigue. Negative for activity change, appetite change, chills and fever.  HENT:  Negative for congestion, postnasal drip, rhinorrhea, sinus pressure and sinus pain.   Eyes: Negative.   Respiratory:  Negative for cough, chest tightness, shortness of breath and wheezing.   Cardiovascular:  Negative for chest pain and palpitations.  Gastrointestinal:  Negative for constipation, diarrhea, nausea and vomiting.  Endocrine: Negative for cold intolerance, heat intolerance, polydipsia and polyuria.  Genitourinary: Negative.   Musculoskeletal:  Negative for arthralgias, back  pain and myalgias.  Skin:  Negative for rash.  Allergic/Immunologic: Negative.   Neurological:  Negative for dizziness, weakness and headaches.  Hematological: Negative.   Psychiatric/Behavioral:  Positive for dysphoric mood and sleep disturbance. The patient is nervous/anxious.    Objective:   Today's Vitals   02/12/21 1354  BP: 96/64  Pulse: 77  Temp: 98.5 F (36.9 C)  SpO2: 98%  Weight: 133 lb 1.6 oz (60.4 kg)  Height: 5\' 3"  (1.6 m)   Body mass index is 23.58 kg/m.   Physical Exam Vitals and nursing note reviewed.  Constitutional:      Appearance: Normal appearance. She is  well-developed.  HENT:     Head: Normocephalic and atraumatic.     Nose: Nose normal.  Eyes:     Pupils: Pupils are equal, round, and reactive to light.  Cardiovascular:     Rate and Rhythm: Normal rate and regular rhythm.     Pulses: Normal pulses.     Heart sounds: Normal heart sounds.  Pulmonary:     Effort: Pulmonary effort is normal.     Breath sounds: Normal breath sounds.  Abdominal:     Palpations: Abdomen is soft.  Musculoskeletal:        General: Normal range of motion.     Cervical back: Normal range of motion and neck supple.  Lymphadenopathy:     Cervical: No cervical adenopathy.  Skin:    General: Skin is warm and dry.     Capillary Refill: Capillary refill takes less than 2 seconds.  Neurological:     General: No focal deficit present.     Mental Status: She is alert and oriented to person, place, and time.  Psychiatric:        Attention and Perception: Attention and perception normal.        Mood and Affect: Mood is anxious and depressed.        Speech: Speech normal.        Behavior: Behavior normal. Behavior is cooperative.        Thought Content: Thought content normal.        Cognition and Memory: Cognition and memory normal.        Judgment: Judgment normal.    Assessment & Plan:  1. Encounter to establish care Appointment today to establish new primary care provider.  2. Other fatigue Will get routine fasting labs which includes a full thyroid panel prior to her next visit.  We will review results with patient at next visit.  3. Generalized anxiety disorder Add sertraline 25 mg tablets daily.  Advised her to start with 1/2 tablet daily for the first 2 to 3 days then increase to 1 tablet daily.  Discussed stress management coping methods to help manage symptoms associated with acute anxiety. - sertraline (ZOLOFT) 25 MG tablet; Take 1 tablet (25 mg total) by mouth daily.  Dispense: 30 tablet; Refill: 1   Problem List Items Addressed This Visit        Other   Generalized anxiety disorder   Relevant Medications   sertraline (ZOLOFT) 25 MG tablet   Encounter to establish care - Primary   Other fatigue    Outpatient Encounter Medications as of 02/12/2021  Medication Sig   cetirizine (ZYRTEC) 10 MG tablet Take 10 mg by mouth daily.   sertraline (ZOLOFT) 25 MG tablet Take 1 tablet (25 mg total) by mouth daily.   [DISCONTINUED] diclofenac (VOLTAREN) 75 MG EC tablet Take 75 mg by mouth 2 (  two) times daily.   cefdinir (OMNICEF) 300 MG capsule Take 1 capsule (300 mg total) by mouth 2 (two) times daily. (Patient not taking: Reported on 02/12/2021)   fluticasone (FLONASE) 50 MCG/ACT nasal spray Place into both nostrils daily. (Patient not taking: Reported on 02/12/2021)   HYDROcodone-homatropine (HYCODAN) 5-1.5 MG/5ML syrup Take 5 mLs by mouth at bedtime as needed for cough. (Patient not taking: Reported on 02/12/2021)   ondansetron (ZOFRAN ODT) 4 MG disintegrating tablet Take 1 tablet (4 mg total) by mouth every 8 (eight) hours as needed for nausea or vomiting. (Patient not taking: Reported on 02/12/2021)   predniSONE (DELTASONE) 10 MG tablet Begin with 6 tabs on day 1, 5 tab on day 2, 4 tab on day 3, 3 tab on day 4, 2 tab on day 5, 1 tab on day 6-take with food (Patient not taking: Reported on 02/12/2021)   No facility-administered encounter medications on file as of 02/12/2021.    Follow-up: Return in about 4 weeks (around 03/12/2021) for health maintenance exam, fbw a week prior - add Free T4 and vitamin d..   Carlean Jews, NP

## 2021-02-23 DIAGNOSIS — Z7689 Persons encountering health services in other specified circumstances: Secondary | ICD-10-CM | POA: Insufficient documentation

## 2021-02-23 DIAGNOSIS — R5383 Other fatigue: Secondary | ICD-10-CM | POA: Insufficient documentation

## 2021-03-10 ENCOUNTER — Other Ambulatory Visit: Payer: 59

## 2021-03-11 ENCOUNTER — Ambulatory Visit (INDEPENDENT_AMBULATORY_CARE_PROVIDER_SITE_OTHER): Payer: 59 | Admitting: Nurse Practitioner

## 2021-03-11 ENCOUNTER — Encounter: Payer: Self-pay | Admitting: Nurse Practitioner

## 2021-03-11 VITALS — Temp 97.6°F | Ht 63.0 in | Wt 133.0 lb

## 2021-03-11 DIAGNOSIS — J069 Acute upper respiratory infection, unspecified: Secondary | ICD-10-CM | POA: Diagnosis not present

## 2021-03-11 NOTE — Progress Notes (Signed)
Virtual Visit via Telephone Note  I connected with Caitlin Bryant on 03/11/21 at  2:30 PM EDT by telephone and verified that I am speaking with the correct person using two identifiers.  Location: Patient: home Provider: [place] [facility] at [facility]     I discussed the limitations, risks, security and privacy concerns of performing an evaluation and management service by telephone and the availability of in person appointments. I also discussed with the patient that there may be a patient responsible charge related to this service. The patient expressed understanding and agreed to proceed.   History of Present Illness: The patient states that 930 last night she took her normal evening medications which include Zoloft and her birth control pill.  She states that she felt like her control pill got stuck in her esophagus for short period time.  She states that about 20 minutes later her throat became very sore.  States it felt like it was on fire.  She states a little while later the left side of her jaw started to throb left ear started to be painful.  She did try to lay down and states that symptoms on the left side got worse and throat continue to hurt terribly.  She took some ibuprofen and was finally able to fall asleep.  She states that this morning left right side of her throat is sore.  States not quite as bad as it was last night.  She states that her left ear feels clogged.  She states left sinuses also feel irritated.  She denies fever or headache.  She denies cough.  She denies nausea or vomiting.  She has not taken any COVID 19 test at this point.  We will take 1 when her husband gets home from work.   Observations/Objective:  The patient is alert and oriented. She is pleasant and answers all questions appropriately. Breathing is non-labored. She is in no acute distress at this time.    Assessment and Plan: 1. Viral upper respiratory infection Rest and increase fluids.  She should take OTC medication to control symptoms.  Encouraged her to test for COVID-19 and notify office of results.  She is at low risk for negative events if COVID were to be positive.  If symptoms persist or get worse over the next 7 days, and COVID-19 test is negative, she should contact the office so that further options for treatment can be discussed.   Follow Up Instructions:    I discussed the assessment and treatment plan with the patient. The patient was provided an opportunity to ask questions and all were answered. The patient agreed with the plan and demonstrated an understanding of the instructions.   The patient was advised to call back or seek an in-person evaluation if the symptoms worsen or if the condition fails to improve as anticipated.  I provided 15 minutes of non-face-to-face time during this encounter.  This note was dictated using Conservation officer, historic buildings. Rapid proofreading was performed to expedite the delivery of the information. Despite proofreading, phonetic errors will occur which are common with this voice recognition software. Please take this into consideration. If there are any concerns, please contact our office.    Carlean Jews, NP

## 2021-03-12 ENCOUNTER — Telehealth: Payer: Self-pay | Admitting: Nurse Practitioner

## 2021-03-12 ENCOUNTER — Other Ambulatory Visit: Payer: Self-pay | Admitting: Nurse Practitioner

## 2021-03-12 DIAGNOSIS — Z0000 Encounter for general adult medical examination without abnormal findings: Secondary | ICD-10-CM

## 2021-03-12 NOTE — Telephone Encounter (Signed)
That's great. Thank you so much for letting me know

## 2021-03-12 NOTE — Telephone Encounter (Signed)
Patient's COVID test was negative and she would like her provider to know she is feeling much better. Thanks

## 2021-03-13 ENCOUNTER — Other Ambulatory Visit: Payer: 59

## 2021-03-13 ENCOUNTER — Other Ambulatory Visit: Payer: Self-pay

## 2021-03-13 DIAGNOSIS — Z0000 Encounter for general adult medical examination without abnormal findings: Secondary | ICD-10-CM

## 2021-03-14 LAB — CBC
Hematocrit: 41.1 % (ref 34.0–46.6)
Hemoglobin: 13.7 g/dL (ref 11.1–15.9)
MCH: 29.8 pg (ref 26.6–33.0)
MCHC: 33.3 g/dL (ref 31.5–35.7)
MCV: 90 fL (ref 79–97)
Platelets: 210 10*3/uL (ref 150–450)
RBC: 4.59 x10E6/uL (ref 3.77–5.28)
RDW: 12 % (ref 11.7–15.4)
WBC: 7.5 10*3/uL (ref 3.4–10.8)

## 2021-03-14 LAB — LIPID PANEL
Chol/HDL Ratio: 3 ratio (ref 0.0–4.4)
Cholesterol, Total: 134 mg/dL (ref 100–199)
HDL: 45 mg/dL (ref >39)
LDL Chol Calc (NIH): 69 mg/dL (ref 0–99)
Triglycerides: 112 mg/dL (ref 0–149)
VLDL Cholesterol Cal: 20 mg/dL (ref 5–40)

## 2021-03-14 LAB — COMPREHENSIVE METABOLIC PANEL
ALT: 4 IU/L (ref 0–32)
AST: 7 IU/L (ref 0–40)
Albumin/Globulin Ratio: 1.5 (ref 1.2–2.2)
Albumin: 4.4 g/dL (ref 3.8–4.8)
Alkaline Phosphatase: 48 IU/L (ref 44–121)
BUN/Creatinine Ratio: 21 (ref 9–23)
BUN: 13 mg/dL (ref 6–20)
Bilirubin Total: 0.4 mg/dL (ref 0.0–1.2)
CO2: 22 mmol/L (ref 20–29)
Calcium: 8.8 mg/dL (ref 8.7–10.2)
Chloride: 103 mmol/L (ref 96–106)
Creatinine, Ser: 0.63 mg/dL (ref 0.57–1.00)
Globulin, Total: 2.9 g/dL (ref 1.5–4.5)
Glucose: 81 mg/dL (ref 65–99)
Potassium: 4.3 mmol/L (ref 3.5–5.2)
Sodium: 139 mmol/L (ref 134–144)
Total Protein: 7.3 g/dL (ref 6.0–8.5)
eGFR: 116 mL/min/{1.73_m2} (ref >59)

## 2021-03-14 LAB — T4, FREE: Free T4: 1.4 ng/dL (ref 0.82–1.77)

## 2021-03-14 LAB — HEMOGLOBIN A1C
Est. average glucose Bld gHb Est-mCnc: 103 mg/dL
Hgb A1c MFr Bld: 5.2 % (ref 4.8–5.6)

## 2021-03-14 LAB — TSH: TSH: 0.768 u[IU]/mL (ref 0.450–4.500)

## 2021-03-14 LAB — VITAMIN D 25 HYDROXY (VIT D DEFICIENCY, FRACTURES): Vit D, 25-Hydroxy: 42.9 ng/mL (ref 30.0–100.0)

## 2021-03-17 ENCOUNTER — Ambulatory Visit (INDEPENDENT_AMBULATORY_CARE_PROVIDER_SITE_OTHER): Payer: 59 | Admitting: Nurse Practitioner

## 2021-03-17 ENCOUNTER — Other Ambulatory Visit: Payer: Self-pay

## 2021-03-17 ENCOUNTER — Encounter: Payer: Self-pay | Admitting: Nurse Practitioner

## 2021-03-17 VITALS — BP 103/66 | HR 66 | Temp 97.3°F | Ht 63.0 in | Wt 135.9 lb

## 2021-03-17 DIAGNOSIS — F411 Generalized anxiety disorder: Secondary | ICD-10-CM

## 2021-03-17 DIAGNOSIS — Z0000 Encounter for general adult medical examination without abnormal findings: Secondary | ICD-10-CM

## 2021-03-17 MED ORDER — SERTRALINE HCL 25 MG PO TABS: 50.0000 mg | ORAL_TABLET | Freq: Every day | ORAL | 2 refills | Status: DC

## 2021-03-17 NOTE — Progress Notes (Signed)
Established Patient Office Visit  Subjective:  Patient ID: Caitlin Bryant, female    DOB: [birth year]  Age: 40 y.o. MRN: [id number]  CC:  Chief Complaint  Patient presents with   Annual Exam    HPI Caitlin Bryant presents for wellness visit.  She does see GYN provider for well woman exams and Pap smears.  She was started on sertraline 25 mg tablets on her most recent visit due to generalized anxiety disorder.  States that she does feel better but still has many times where she feels abnormally stressed.  Feels and inner angst.  Though her jaw feels improved and loss or, she does feel that her jaw is still clenching when she gets anxious.  She denies feeling suicidal.  She does not want to hurt herself or anyone else.  Her PHQ-9 score is slightly higher today than on admission visit.  She assures me that she is feeling better now than she was done.  Depression screen [facility] 2/9 03/17/2021 02/12/2021  Decreased Interest 1 0  Down, Depressed, Hopeless 1 0  PHQ - 2 Score 2 0  Altered sleeping 1 2  Tired, decreased energy 1 1  Change in appetite 1 0  Feeling bad or failure about yourself  1 0  Trouble concentrating 1 0  Moving slowly or fidgety/restless 1 0  Suicidal thoughts 0 0  PHQ-9 Score 8 3   She has had routine, fasting labs done since her initial visit with me.  All labs were within normal limits.  Past Medical History:  Diagnosis Date   MRSA (methicillin resistant staph aureus) culture positive     Past Surgical History:  Procedure Laterality Date   CESAREAN SECTION  2004 and 2008   x 2    Family History  Problem Relation Age of Onset   Hypertension Mother    Diabetes Mother    Osteoarthritis Mother    Fibromyalgia Mother    Heart attack Father        Defibrillator    Social History   Socioeconomic History   Marital status: Married    Spouse name: Not on file   Number of children: Not on file   Years of education: Not on file   Highest education  level: Not on file  Occupational History   Not on file  Tobacco Use   Smoking status: Never   Smokeless tobacco: Never  Vaping Use   Vaping Use: Never used  Substance and Sexual Activity   Alcohol use: Yes   Drug use: No   Sexual activity: Yes    Partners: Male  Other Topics Concern   Not on file  Social History Narrative   Not on file   Social Determinants of Health   Financial Resource Strain: Not on file  Food Insecurity: Not on file  Transportation Needs: Not on file  Physical Activity: Not on file  Stress: Not on file  Social Connections: Not on file  Intimate Partner Violence: Not on file    Outpatient Medications Prior to Visit  Medication Sig Dispense Refill   cetirizine (ZYRTEC) 10 MG tablet Take 10 mg by mouth daily.     fluticasone (FLONASE) 50 MCG/ACT nasal spray Place into both nostrils daily.     ondansetron (ZOFRAN ODT) 4 MG disintegrating tablet Take 1 tablet (4 mg total) by mouth every 8 (eight) hours as needed for nausea or vomiting. 20 tablet 0   predniSONE (DELTASONE) 10 MG tablet Begin with 6 tabs  on day 1, 5 tab on day 2, 4 tab on day 3, 3 tab on day 4, 2 tab on day 5, 1 tab on day 6-take with food 21 tablet 0   cefdinir (OMNICEF) 300 MG capsule Take 1 capsule (300 mg total) by mouth 2 (two) times daily. 20 capsule 0   HYDROcodone-homatropine (HYCODAN) 5-1.5 MG/5ML syrup Take 5 mLs by mouth at bedtime as needed for cough. 75 mL 0   sertraline (ZOLOFT) 25 MG tablet Take 1 tablet (25 mg total) by mouth daily. 30 tablet 1   No facility-administered medications prior to visit.    Allergies  Allergen Reactions   Sumatriptan Nausea And Vomiting    Severe nausea and vomiting   Sulfa Antibiotics Rash    ROS Review of Systems  Constitutional:  Negative for activity change, appetite change, chills, fatigue and fever.       Improved fatigue.  HENT:  Negative for congestion, postnasal drip, rhinorrhea, sinus pressure and sinus pain.   Eyes: Negative.    Respiratory:  Negative for cough, chest tightness, shortness of breath and wheezing.   Cardiovascular:  Negative for chest pain and palpitations.  Gastrointestinal:  Negative for abdominal pain, constipation, diarrhea, nausea and vomiting.       She does report increased gas and bloating as the day goes on.  Denies diarrhea, loose stools, or constipation.  Genitourinary: Negative.   Musculoskeletal:  Negative for arthralgias, back pain and myalgias.  Skin:  Negative for rash.  Allergic/Immunologic: Negative.   Neurological:  Negative for dizziness, weakness and headaches.  Hematological: Negative.   Psychiatric/Behavioral:  Positive for dysphoric mood. Negative for sleep disturbance. The patient is nervous/anxious.        Patient reports improved mood and anxiety since her most recent visit.  Was started on sertraline 25 mg tablets every evening.     Objective:    Physical Exam Vitals and nursing note reviewed.  Constitutional:      Appearance: Normal appearance. She is well-developed.  HENT:     Head: Normocephalic and atraumatic.     Right Ear: Ear canal and external ear normal.     Left Ear: Ear canal and external ear normal.     Nose: Nose normal.     Mouth/Throat:     Mouth: Mucous membranes are moist.     Pharynx: Oropharynx is clear.  Eyes:     Extraocular Movements: Extraocular movements intact.     Conjunctiva/sclera: Conjunctivae normal.     Pupils: Pupils are equal, round, and reactive to light.  Cardiovascular:     Rate and Rhythm: Normal rate and regular rhythm.     Pulses: Normal pulses.     Heart sounds: Normal heart sounds.  Pulmonary:     Effort: Pulmonary effort is normal.     Breath sounds: Normal breath sounds.  Abdominal:     General: Bowel sounds are normal.     Palpations: Abdomen is soft.     Tenderness: There is no abdominal tenderness. There is no guarding.  Musculoskeletal:        General: Normal range of motion.     Cervical back: Normal  range of motion and neck supple.  Lymphadenopathy:     Cervical: No cervical adenopathy.  Skin:    General: Skin is warm and dry.     Capillary Refill: Capillary refill takes less than 2 seconds.  Neurological:     General: No focal deficit present.     Mental Status:  She is alert and oriented to person, place, and time.  Psychiatric:        Mood and Affect: Mood normal.        Behavior: Behavior normal.        Thought Content: Thought content normal.        Judgment: Judgment normal.    Today's Vitals   03/17/21 1011  BP: 103/66  Pulse: 66  Temp: (!) 97.3 F (36.3 C)  SpO2: 100%  Weight: 135 lb 14.4 oz (61.6 kg)  Height: _0  (1.6 m)   Body mass index is 24.07 kg/m.   Wt Readings from Last 3 Encounters:  03/17/21 135 lb 14.4 oz (61.6 kg)  03/11/21 133 lb (60.3 kg)  02/12/21 133 lb 1.6 oz (60.4 kg)     Health Maintenance Due  Topic Date Due   HIV Screening  Never done   Hepatitis C Screening  Never done   PAP SMEAR-Modifier  01/12/2020   COVID-19 Vaccine (3 - Booster for Pfizer series) 04/26/2020    There are no preventive care reminders to display for this patient.  Lab Results  Component Value Date   TSH 0.768 03/13/2021   Lab Results  Component Value Date   WBC 7.5 03/13/2021   HGB 13.7 03/13/2021   HCT 41.1 03/13/2021   MCV 90 03/13/2021   PLT 210 03/13/2021   Lab Results  Component Value Date   NA 139 03/13/2021   K 4.3 03/13/2021   CO2 22 03/13/2021   GLUCOSE 81 03/13/2021   BUN 13 03/13/2021   CREATININE 0.63 03/13/2021   BILITOT 0.4 03/13/2021   ALKPHOS 48 03/13/2021   AST 7 03/13/2021   ALT 4 03/13/2021   PROT 7.3 03/13/2021   ALBUMIN 4.4 03/13/2021   CALCIUM 8.8 03/13/2021   EGFR 116 03/13/2021   GFR 144.81 06/22/2017   Lab Results  Component Value Date   CHOL 134 03/13/2021   Lab Results  Component Value Date   HDL 45 03/13/2021   Lab Results  Component Value Date   LDLCALC 69 03/13/2021   Lab Results  Component  Value Date   TRIG 112 03/13/2021   Lab Results  Component Value Date   CHOLHDL 3.0 03/13/2021   Lab Results  Component Value Date   HGBA1C 5.2 03/13/2021      Assessment & Plan:  1. Routine general medical examination at a health care facility Annual health maintenance exam today.  Reviewed recent routine, fasting labs.  All within normal limits.  2. Generalized anxiety disorder Improving at desired level yet.  Increase sertraline to 50 mg daily.  Will monitor.  Follow-up with patient in 3 months - sertraline (ZOLOFT) 25 MG tablet; Take 2 tablets (50 mg total) by mouth daily.  Dispense: 60 tablet; Refill: 2   Problem List Items Addressed This Visit       Other   Generalized anxiety disorder   Relevant Medications   sertraline (ZOLOFT) 25 MG tablet   Routine general medical examination at a health care facility - Primary    Meds ordered this encounter  Medications   sertraline (ZOLOFT) 25 MG tablet    Sig: Take 2 tablets (50 mg total) by mouth daily.    Dispense:  60 tablet    Refill:  2    Please note increased dosing to two tablets daily    Order Specific Question:   Supervising Provider    Answer:   Beatrice Lecher D [[id number]]  Follow-up: Return in about 3 months (around 06/17/2021) for mood. increased sertraline to 50 mg.   This note was dictated using Systems analyst. Rapid proofreading was performed to expedite the delivery of the information. Despite proofreading, phonetic errors will occur which are common with this voice recognition software. Please take this into consideration. If there are any concerns, please contact our office.    Ronnell Freshwater, NP

## 2021-05-30 ENCOUNTER — Ambulatory Visit (INDEPENDENT_AMBULATORY_CARE_PROVIDER_SITE_OTHER): Payer: 59 | Admitting: Nurse Practitioner

## 2021-05-30 ENCOUNTER — Encounter: Payer: Self-pay | Admitting: Nurse Practitioner

## 2021-05-30 ENCOUNTER — Other Ambulatory Visit: Payer: Self-pay

## 2021-05-30 VITALS — BP 101/68 | HR 76 | Temp 98.2°F | Ht 63.0 in | Wt 144.1 lb

## 2021-05-30 DIAGNOSIS — M25572 Pain in left ankle and joints of left foot: Secondary | ICD-10-CM

## 2021-05-30 NOTE — Progress Notes (Signed)
Acute Office Visit  Subjective:    Patient ID: Caitlin Bryant, female    DOB: 05-15-1981, 40 y.o.   MRN: [id number]  Chief Complaint  Patient presents with   Ankle Pain    The patient states that she has had problems with both ankles for most of her life. She states that when they bother her, she will ice, elevate, and rest the ankle. She will also wear an ankle brace. After a little bit of time, the pai will generally get better. She states that in middle of September, the left ankle started to swell, and hurt more than usual. She has also noted some bruising to the inner part of the left ankle. She denies known injury or trauma. She states that she has been resting, icing, and elevating the left ankle. She has also taken ibuprofen which will take the edge off the pain for a short time. She states that there are times that the ankle and foot just can't hold her up. During those times, she has to crawl from one place in her home to another. She never knos when this is going to happen.   Ankle Pain  The incident occurred more than 1 week ago (started to get much worse in the middle of September). The incident occurred at home. The injury mechanism is unknown. The pain is present in the left ankle. The quality of the pain is described as burning, cramping and aching. The pain is at a severity of 5/10 (has been as bad as 7/8. currently, pain is about 4/10). The pain is moderate. The pain has been Fluctuating since onset. Associated symptoms include an inability to bear weight and muscle weakness. She reports no foreign bodies present. The symptoms are aggravated by movement and weight bearing. She has tried ice, non-weight bearing, immobilization, elevation, rest and NSAIDs for the symptoms. The treatment provided mild relief.    Past Medical History:  Diagnosis Date   MRSA (methicillin resistant staph aureus) culture positive     Past Surgical History:  Procedure Laterality Date    CESAREAN SECTION  2004 and 2008   x 2    Family History  Problem Relation Age of Onset   Hypertension Mother    Diabetes Mother    Osteoarthritis Mother    Fibromyalgia Mother    Heart attack Father        Defibrillator    Social History   Socioeconomic History   Marital status: Married    Spouse name: Not on file   Number of children: Not on file   Years of education: Not on file   Highest education level: Not on file  Occupational History   Not on file  Tobacco Use   Smoking status: Never   Smokeless tobacco: Never  Vaping Use   Vaping Use: Never used  Substance and Sexual Activity   Alcohol use: Yes   Drug use: No   Sexual activity: Yes    Partners: Male  Other Topics Concern   Not on file  Social History Narrative   Not on file   Social Determinants of Health   Financial Resource Strain: Not on file  Food Insecurity: Not on file  Transportation Needs: Not on file  Physical Activity: Not on file  Stress: Not on file  Social Connections: Not on file  Intimate Partner Violence: Not on file    Outpatient Medications Prior to Visit  Medication Sig Dispense Refill   cetirizine (ZYRTEC) 10 MG  tablet Take 10 mg by mouth daily.     fluticasone (FLONASE) 50 MCG/ACT nasal spray Place into both nostrils daily.     ondansetron (ZOFRAN ODT) 4 MG disintegrating tablet Take 1 tablet (4 mg total) by mouth every 8 (eight) hours as needed for nausea or vomiting. 20 tablet 0   predniSONE (DELTASONE) 10 MG tablet Begin with 6 tabs on day 1, 5 tab on day 2, 4 tab on day 3, 3 tab on day 4, 2 tab on day 5, 1 tab on day 6-take with food 21 tablet 0   sertraline (ZOLOFT) 25 MG tablet Take 2 tablets (50 mg total) by mouth daily. 60 tablet 2   No facility-administered medications prior to visit.    Allergies  Allergen Reactions   Sumatriptan Nausea And Vomiting    Severe nausea and vomiting   Sulfa Antibiotics Rash    Review of Systems  Constitutional:  Positive for  activity change. Negative for appetite change, chills, fatigue and fever.       Decreased physical activity due to ankle  HENT:  Negative for congestion, postnasal drip, rhinorrhea, sinus pressure, sinus pain, sneezing and sore throat.   Eyes: Negative.   Respiratory:  Negative for cough, chest tightness, shortness of breath and wheezing.   Cardiovascular:  Negative for chest pain and palpitations.  Gastrointestinal:  Negative for abdominal pain, constipation, diarrhea, nausea and vomiting.  Endocrine: Negative for cold intolerance, heat intolerance, polydipsia and polyuria.  Genitourinary:  Negative for dyspareunia, dysuria, flank pain, frequency and urgency.  Musculoskeletal:  Positive for arthralgias, gait problem and joint swelling. Negative for back pain and myalgias.       Left ankle pain, worse with weightbearing and exertion.  Started after twisting her left ankle while walking in the yard at home.  Skin:  Negative for rash.  Allergic/Immunologic: Negative for environmental allergies.  Neurological:  Negative for dizziness, weakness and headaches.  Hematological:  Negative for adenopathy.  Psychiatric/Behavioral:  The patient is not nervous/anxious.       Objective:    Physical Exam Vitals and nursing note reviewed.  Constitutional:      Appearance: Normal appearance. She is well-developed.  HENT:     Head: Normocephalic and atraumatic.     Nose: Nose normal.  Eyes:     Extraocular Movements: Extraocular movements intact.     Conjunctiva/sclera: Conjunctivae normal.     Pupils: Pupils are equal, round, and reactive to light.  Cardiovascular:     Rate and Rhythm: Normal rate and regular rhythm.     Pulses: Normal pulses.     Heart sounds: Normal heart sounds.  Pulmonary:     Effort: Pulmonary effort is normal.     Breath sounds: Normal breath sounds.  Abdominal:     Palpations: Abdomen is soft.  Musculoskeletal:        General: Normal range of motion.     Cervical  back: Normal range of motion and neck supple.     Comments: There is left ankle tenderness especially along the medial aspect of the left ankle.  Mild swelling present.  Range of motion and strength slightly diminished due to pain and swelling.  Difficult to bear weight onto left foot.  Patient is walking with a minimal limp, favoring the left side.  Lymphadenopathy:     Cervical: No cervical adenopathy.  Skin:    General: Skin is warm and dry.     Capillary Refill: Capillary refill takes less than 2 seconds.  Neurological:     General: No focal deficit present.     Mental Status: She is alert and oriented to person, place, and time.  Psychiatric:        Mood and Affect: Mood normal.        Behavior: Behavior normal.        Thought Content: Thought content normal.        Judgment: Judgment normal.    Today's Vitals   05/30/21 0846  BP: 101/68  Pulse: 76  Temp: 98.2 F (36.8 C)  SpO2: 98%  Weight: 144 lb 1.6 oz (65.4 kg)  Height: 5' 3"  (1.6 m)   Body mass index is 25.53 kg/m.   Wt Readings from Last 3 Encounters:  05/30/21 144 lb 1.6 oz (65.4 kg)  03/17/21 135 lb 14.4 oz (61.6 kg)  03/11/21 133 lb (60.3 kg)    Health Maintenance Due  Topic Date Due   HIV Screening  Never done   Hepatitis C Screening  Never done   PAP SMEAR-Modifier  01/12/2020   COVID-19 Vaccine (3 - Booster for [employer] series) 04/26/2020   INFLUENZA VACCINE  03/24/2021    There are no preventive care reminders to display for this patient.   Lab Results  Component Value Date   TSH 0.768 03/13/2021   Lab Results  Component Value Date   WBC 7.5 03/13/2021   HGB 13.7 03/13/2021   HCT 41.1 03/13/2021   MCV 90 03/13/2021   PLT 210 03/13/2021   Lab Results  Component Value Date   NA 139 03/13/2021   K 4.3 03/13/2021   CO2 22 03/13/2021   GLUCOSE 81 03/13/2021   BUN 13 03/13/2021   CREATININE 0.63 03/13/2021   BILITOT 0.4 03/13/2021   ALKPHOS 48 03/13/2021   AST 7 03/13/2021   ALT 4  03/13/2021   PROT 7.3 03/13/2021   ALBUMIN 4.4 03/13/2021   CALCIUM 8.8 03/13/2021   EGFR 116 03/13/2021   GFR 144.81 06/22/2017   Lab Results  Component Value Date   CHOL 134 03/13/2021   Lab Results  Component Value Date   HDL 45 03/13/2021   Lab Results  Component Value Date   LDLCALC 69 03/13/2021   Lab Results  Component Value Date   TRIG 112 03/13/2021   Lab Results  Component Value Date   CHOLHDL 3.0 03/13/2021   Lab Results  Component Value Date   HGBA1C 5.2 03/13/2021       Assessment & Plan:  1. Acute left ankle pain Will get x-ray of left ankle for further evaluation.  Referral to orthopedics/podiatry for continued management.  Encouraged her to rest, ice, and elevate the left foot when possible.  Advised her to wear Ace bandage or compression sleeve on left ankle to provide support.  May take Tylenol and/or ibuprofen as needed and as indicated to reduce pain and inflammation.  Patient voiced understanding and agreement with all instructions. - DG Ankle 2 Views Left; Future - Ambulatory referral to Orthopedic Surgery   Problem List Items Addressed This Visit       Other   Acute left ankle pain - Primary   Relevant Orders   DG Ankle 2 Views Left   Ambulatory referral to Orthopedic Surgery    This note was dictated using Dragon Voice Recognition Software. Rapid proofreading was performed to expedite the delivery of the information. Despite proofreading, phonetic errors will occur which are common with this voice recognition software. Please take this into consideration. If  there are any concerns, please contact our office.     Ronnell Freshwater, NP

## 2021-06-08 DIAGNOSIS — M25572 Pain in left ankle and joints of left foot: Secondary | ICD-10-CM | POA: Insufficient documentation

## 2021-06-08 DIAGNOSIS — G8929 Other chronic pain: Secondary | ICD-10-CM | POA: Insufficient documentation

## 2021-06-12 ENCOUNTER — Ambulatory Visit (INDEPENDENT_AMBULATORY_CARE_PROVIDER_SITE_OTHER): Payer: 59 | Admitting: Nurse Practitioner

## 2021-06-12 ENCOUNTER — Other Ambulatory Visit: Payer: Self-pay

## 2021-06-12 ENCOUNTER — Encounter: Payer: Self-pay | Admitting: Nurse Practitioner

## 2021-06-12 VITALS — BP 110/71 | HR 66 | Temp 98.1°F | Ht 63.0 in | Wt 146.3 lb

## 2021-06-12 DIAGNOSIS — M519 Unspecified thoracic, thoracolumbar and lumbosacral intervertebral disc disorder: Secondary | ICD-10-CM | POA: Diagnosis not present

## 2021-06-12 DIAGNOSIS — M25559 Pain in unspecified hip: Secondary | ICD-10-CM

## 2021-06-12 DIAGNOSIS — M5441 Lumbago with sciatica, right side: Secondary | ICD-10-CM

## 2021-06-12 MED ORDER — METHYLPREDNISOLONE 4 MG PO TBPK: ORAL_TABLET | ORAL | 0 refills | Status: DC

## 2021-06-12 MED ORDER — METHYLPREDNISOLONE SODIUM SUCC 125 MG IJ SOLR
125.0000 mg | Freq: Once | INTRAMUSCULAR | Status: AC
  Administered 2021-06-12: 125 mg via INTRAMUSCULAR

## 2021-06-12 MED ORDER — DICLOFENAC SODIUM 50 MG PO TBEC: 50.0000 mg | DELAYED_RELEASE_TABLET | Freq: Two times a day (BID) | ORAL | 1 refills | Status: DC

## 2021-06-12 MED ORDER — KETOROLAC TROMETHAMINE 60 MG/2ML IM SOLN
60.0000 mg | Freq: Once | INTRAMUSCULAR | Status: AC
  Administered 2021-06-12: 60 mg via INTRAMUSCULAR

## 2021-06-12 NOTE — Progress Notes (Signed)
Acute Office Visit  Subjective:    Patient ID: Caitlin Bryant, female    DOB: Feb 11, 1981, 40 y.o.   MRN: [id number]  Chief Complaint  Patient presents with   Hip Pain    Hip Pain  The incident occurred 1 to 3 hours ago. The incident occurred at home. There was no injury mechanism. The pain is present in the right hip. The quality of the pain is described as stabbing and shooting. The pain is moderate. The pain has been Constant since onset. Associated symptoms include an inability to bear weight and a loss of motion. She reports no foreign bodies present. The symptoms are aggravated by movement. She has tried rest for the symptoms. The treatment provided mild relief.    Past Medical History:  Diagnosis Date   MRSA (methicillin resistant staph aureus) culture positive     Past Surgical History:  Procedure Laterality Date   CESAREAN SECTION  2004 and 2008   x 2    Family History  Problem Relation Age of Onset   Hypertension Mother    Diabetes Mother    Osteoarthritis Mother    Fibromyalgia Mother    Heart attack Father        Defibrillator    Social History   Socioeconomic History   Marital status: Married    Spouse name: Not on file   Number of children: Not on file   Years of education: Not on file   Highest education level: Not on file  Occupational History   Not on file  Tobacco Use   Smoking status: Never   Smokeless tobacco: Never  Vaping Use   Vaping Use: Never used  Substance and Sexual Activity   Alcohol use: Yes   Drug use: No   Sexual activity: Yes    Partners: Male  Other Topics Concern   Not on file  Social History Narrative   Not on file   Social Determinants of Health   Financial Resource Strain: Not on file  Food Insecurity: Not on file  Transportation Needs: Not on file  Physical Activity: Not on file  Stress: Not on file  Social Connections: Not on file  Intimate Partner Violence: Not on file    Outpatient Medications  Prior to Visit  Medication Sig Dispense Refill   cetirizine (ZYRTEC) 10 MG tablet Take 10 mg by mouth daily.     fluticasone (FLONASE) 50 MCG/ACT nasal spray Place into both nostrils daily.     ondansetron (ZOFRAN ODT) 4 MG disintegrating tablet Take 1 tablet (4 mg total) by mouth every 8 (eight) hours as needed for nausea or vomiting. 20 tablet 0   sertraline (ZOLOFT) 25 MG tablet Take 2 tablets (50 mg total) by mouth daily. 60 tablet 2   predniSONE (DELTASONE) 10 MG tablet Begin with 6 tabs on day 1, 5 tab on day 2, 4 tab on day 3, 3 tab on day 4, 2 tab on day 5, 1 tab on day 6-take with food 21 tablet 0   No facility-administered medications prior to visit.    Allergies  Allergen Reactions   Sumatriptan Nausea And Vomiting    Severe nausea and vomiting   Sulfa Antibiotics Rash    Review of Systems  Constitutional:  Positive for activity change. Negative for appetite change, chills, fatigue and fever.       Decreased exercise and activity due to increased pain in right hip.  HENT:  Negative for congestion, postnasal drip, rhinorrhea, sinus  pressure, sinus pain, sneezing and sore throat.   Eyes: Negative.   Respiratory:  Negative for cough, chest tightness, shortness of breath and wheezing.   Cardiovascular:  Negative for chest pain and palpitations.  Gastrointestinal:  Negative for abdominal pain, constipation, diarrhea, nausea and vomiting.  Endocrine: Negative for cold intolerance, heat intolerance, polydipsia and polyuria.  Genitourinary:  Negative for dyspareunia, dysuria, flank pain, frequency and urgency.  Musculoskeletal:  Positive for arthralgias, gait problem, joint swelling and myalgias. Negative for back pain.       Right hip pain.  Skin:  Negative for rash.  Allergic/Immunologic: Negative for environmental allergies.  Neurological:  Negative for dizziness, weakness and headaches.  Hematological:  Negative for adenopathy.  Psychiatric/Behavioral:  The patient is not  nervous/anxious.       Objective:    Physical Exam Vitals and nursing note reviewed.  Constitutional:      Appearance: Normal appearance. She is well-developed.     Comments: In pain  HENT:     Head: Normocephalic.     Nose: Nose normal.     Mouth/Throat:     Mouth: Mucous membranes are moist.     Pharynx: Oropharynx is clear.  Eyes:     Extraocular Movements: Extraocular movements intact.     Conjunctiva/sclera: Conjunctivae normal.     Pupils: Pupils are equal, round, and reactive to light.  Cardiovascular:     Rate and Rhythm: Normal rate and regular rhythm.     Pulses: Normal pulses.     Heart sounds: Normal heart sounds.  Pulmonary:     Effort: Pulmonary effort is normal.     Breath sounds: Normal breath sounds.  Abdominal:     Palpations: Abdomen is soft.  Musculoskeletal:        General: Normal range of motion.     Cervical back: Normal range of motion and neck supple.     Comments: There is moderate right hip pain.  There is palpable area of tenderness just above right posterior iliac crest.  No swelling, no bruising, and no bony abnormalities or deformities can be felt today.  Patient is walking with a limp, favoring the right side.  Lymphadenopathy:     Cervical: No cervical adenopathy.  Skin:    General: Skin is warm and dry.     Capillary Refill: Capillary refill takes less than 2 seconds.  Neurological:     General: No focal deficit present.     Mental Status: She is alert and oriented to person, place, and time.  Psychiatric:        Mood and Affect: Mood normal.        Behavior: Behavior normal.        Thought Content: Thought content normal.        Judgment: Judgment normal.    Today's Vitals   06/12/21 1607  BP: 110/71  Pulse: 66  Temp: 98.1 F (36.7 C)  SpO2: 99%  Weight: 146 lb 4.8 oz (66.4 kg)  Height: _0  (1.6 m)   Body mass index is 25.92 kg/m.   Wt Readings from Last 3 Encounters:  06/12/21 146 lb 4.8 oz (66.4 kg)  05/30/21 144 lb  1.6 oz (65.4 kg)  03/17/21 135 lb 14.4 oz (61.6 kg)    Health Maintenance Due  Topic Date Due   HIV Screening  Never done   Hepatitis C Screening  Never done   PAP SMEAR-Modifier  01/12/2020   COVID-19 Vaccine (3 - Booster for [employer] series)  01/20/2020   INFLUENZA VACCINE  03/24/2021    There are no preventive care reminders to display for this patient.   Lab Results  Component Value Date   TSH 0.768 03/13/2021   Lab Results  Component Value Date   WBC 7.5 03/13/2021   HGB 13.7 03/13/2021   HCT 41.1 03/13/2021   MCV 90 03/13/2021   PLT 210 03/13/2021   Lab Results  Component Value Date   NA 139 03/13/2021   K 4.3 03/13/2021   CO2 22 03/13/2021   GLUCOSE 81 03/13/2021   BUN 13 03/13/2021   CREATININE 0.63 03/13/2021   BILITOT 0.4 03/13/2021   ALKPHOS 48 03/13/2021   AST 7 03/13/2021   ALT 4 03/13/2021   PROT 7.3 03/13/2021   ALBUMIN 4.4 03/13/2021   CALCIUM 8.8 03/13/2021   EGFR 116 03/13/2021   GFR 144.81 06/22/2017   Lab Results  Component Value Date   CHOL 134 03/13/2021   Lab Results  Component Value Date   HDL 45 03/13/2021   Lab Results  Component Value Date   LDLCALC 69 03/13/2021   Lab Results  Component Value Date   TRIG 112 03/13/2021   Lab Results  Component Value Date   CHOLHDL 3.0 03/13/2021   Lab Results  Component Value Date   HGBA1C 5.2 03/13/2021       Assessment & Plan:  1. Acute right-sided low back pain with right-sided sciatica Suspect right-sided sciatica versus right hip pain.  Toradol 60 mg injection given and Solu-Medrol 125 mg injection given today.  We will start Medrol taper in the next 24 to 48 hours.  Take as directed for 6 days.  Start diclofenac 50 mg tablets.  Take twice daily as needed for pain and inflammation. - diclofenac (VOLTAREN) 50 MG EC tablet; Take 1 tablet (50 mg total) by mouth 2 (two) times daily.  Dispense: 45 tablet; Refill: 1 - methylPREDNISolone (MEDROL) 4 MG TBPK tablet; Take by mouth as  directed for 6 days  Dispense: 21 tablet; Refill: 0  2. Hip pain oradol 60 mg injection given and Solu-Medrol 125 mg injection given today.   - methylPREDNISolone sodium succinate (SOLU-MEDROL) 125 mg/2 mL injection 125 mg - ketorolac (TORADOL) injection 60 mg  3. Disorder of lumbosacral intervertebral disc Suspect lumbar disc issue with resulting sciatica as opposed to right hip pain.  Patient to follow-up on Monday we will reassess at that time.  Problem List Items Addressed This Visit       Nervous and Auditory   Acute right-sided low back pain with right-sided sciatica - Primary   Relevant Medications   diclofenac (VOLTAREN) 50 MG EC tablet   methylPREDNISolone (MEDROL) 4 MG TBPK tablet     Musculoskeletal and Integument   Disorder of lumbosacral intervertebral disc     Other   Hip pain     Meds ordered this encounter  Medications   diclofenac (VOLTAREN) 50 MG EC tablet    Sig: Take 1 tablet (50 mg total) by mouth 2 (two) times daily.    Dispense:  45 tablet    Refill:  1    Order Specific Question:   Supervising Provider    Answer:   Beatrice Lecher D [[id number]]   methylPREDNISolone (MEDROL) 4 MG TBPK tablet    Sig: Take by mouth as directed for 6 days    Dispense:  21 tablet    Refill:  0    Order Specific Question:   Supervising Provider  Answer:   Beatrice Lecher D [[id number]]   methylPREDNISolone sodium succinate (SOLU-MEDROL) 125 mg/2 mL injection 125 mg   ketorolac (TORADOL) injection 60 mg     Ronnell Freshwater, NP  This note was dictated using Systems analyst. Rapid proofreading was performed to expedite the delivery of the information. Despite proofreading, phonetic errors will occur which are common with this voice recognition software. Please take this into consideration. If there are any concerns, please contact our office.

## 2021-06-15 DIAGNOSIS — M519 Unspecified thoracic, thoracolumbar and lumbosacral intervertebral disc disorder: Secondary | ICD-10-CM | POA: Insufficient documentation

## 2021-06-15 DIAGNOSIS — M5441 Lumbago with sciatica, right side: Secondary | ICD-10-CM | POA: Insufficient documentation

## 2021-06-15 DIAGNOSIS — M25559 Pain in unspecified hip: Secondary | ICD-10-CM | POA: Insufficient documentation

## 2021-06-16 ENCOUNTER — Ambulatory Visit: Payer: 59 | Admitting: Physician Assistant

## 2021-06-16 ENCOUNTER — Encounter: Payer: Self-pay | Admitting: Nurse Practitioner

## 2021-06-16 ENCOUNTER — Other Ambulatory Visit: Payer: Self-pay

## 2021-06-16 ENCOUNTER — Ambulatory Visit (INDEPENDENT_AMBULATORY_CARE_PROVIDER_SITE_OTHER): Payer: 59 | Admitting: Nurse Practitioner

## 2021-06-16 VITALS — BP 104/69 | HR 63 | Temp 97.8°F | Ht 63.0 in | Wt 146.3 lb

## 2021-06-16 DIAGNOSIS — M5441 Lumbago with sciatica, right side: Secondary | ICD-10-CM | POA: Diagnosis not present

## 2021-06-16 DIAGNOSIS — Z23 Encounter for immunization: Secondary | ICD-10-CM

## 2021-06-16 DIAGNOSIS — E663 Overweight: Secondary | ICD-10-CM

## 2021-06-16 DIAGNOSIS — F411 Generalized anxiety disorder: Secondary | ICD-10-CM

## 2021-06-16 MED ORDER — SERTRALINE HCL 25 MG PO TABS: 50.0000 mg | ORAL_TABLET | Freq: Every day | ORAL | 2 refills | Status: DC

## 2021-06-16 NOTE — Patient Instructions (Addendum)
Calorie Counting for Weight Loss Calories are units of energy. Your body needs a certain number of calories from food to keep going throughout the day. When you eat or drink more calories than your body needs, your body stores the extra calories mostly as fat. When you eat or drink fewer calories than your body needs, your body burns fat to get the energy it needs. Calorie counting means keeping track of how many calories you eat and drink each day. Calorie counting can be helpful if you need to lose weight. If you eat fewer calories than your body needs, you should lose weight. Ask your health care provider what a healthy weight is for you. For calorie counting to work, you will need to eat the right number of calories each day to lose a healthy amount of weight per week. A dietitian can help you figure out how many calories you need in a day and will suggest ways to reach your calorie goal. A healthy amount of weight to lose each week is usually 1-2 lb (0.5-0.9 kg). This usually means that your daily calorie intake should be reduced by 500-750 calories. Eating 1,200-1,500 calories a day can help most women lose weight. Eating 1,500-1,800 calories a day can help most men lose weight. What do I need to know about calorie counting? Work with your health care provider or dietitian to determine how many calories you should get each day. To meet your daily calorie goal, you will need to: Find out how many calories are in each food that you would like to eat. Try to do this before you eat. Decide how much of the food you plan to eat. Keep a food log. Do this by writing down what you ate and how many calories it had. To successfully lose weight, it is important to balance calorie counting with a healthy lifestyle that includes regular activity. Where do I find calorie information? The number of calories in a food can be found on a Nutrition Facts label. If a food does not have a Nutrition Facts label, try to  look up the calories online or ask your dietitian for help. Remember that calories are listed per serving. If you choose to have more than one serving of a food, you will have to multiply the calories per serving by the number of servings you plan to eat. For example, the label on a package of bread might say that a serving size is 1 slice and that there are 90 calories in a serving. If you eat 1 slice, you will have eaten 90 calories. If you eat 2 slices, you will have eaten 180 calories. How do I keep a food log? After each time that you eat, record the following in your food log as soon as possible: What you ate. Be sure to include toppings, sauces, and other extras on the food. How much you ate. This can be measured in cups, ounces, or number of items. How many calories were in each food and drink. The total number of calories in the food you ate. Keep your food log near you, such as in a pocket-sized notebook or on an app or website on your mobile phone. Some programs will calculate calories for you and show you how many calories you have left to meet your daily goal. What are some portion-control tips? Know how many calories are in a serving. This will help you know how many servings you can have of a certain food.  Use a measuring cup to measure serving sizes. You could also try weighing out portions on a kitchen scale. With time, you will be able to estimate serving sizes for some foods. Take time to put servings of different foods on your favorite plates or in your favorite bowls and cups so you know what a serving looks like. Try not to eat straight from a food's packaging, such as from a bag or box. Eating straight from the package makes it hard to see how much you are eating and can lead to overeating. Put the amount you would like to eat in a cup or on a plate to make sure you are eating the right portion. Use smaller plates, glasses, and bowls for smaller portions and to prevent  overeating. Try not to multitask. For example, avoid watching TV or using your computer while eating. If it is time to eat, sit down at a table and enjoy your food. This will help you recognize when you are full. It will also help you be more mindful of what and how much you are eating. What are tips for following this plan? Reading food labels Check the calorie count compared with the serving size. The serving size may be smaller than what you are used to eating. Check the source of the calories. Try to choose foods that are high in protein, fiber, and vitamins, and low in saturated fat, trans fat, and sodium. Shopping Read nutrition labels while you shop. This will help you make healthy decisions about which foods to buy. Pay attention to nutrition labels for low-fat or fat-free foods. These foods sometimes have the same number of calories or more calories than the full-fat versions. They also often have added sugar, starch, or salt to make up for flavor that was removed with the fat. Make a grocery list of lower-calorie foods and stick to it. Cooking Try to cook your favorite foods in a healthier way. For example, try baking instead of frying. Use low-fat dairy products. Meal planning Use more fruits and vegetables. One-half of your plate should be fruits and vegetables. Include lean proteins, such as chicken, [place], and fish. Lifestyle Each week, aim to do one of the following: 150 minutes of moderate exercise, such as walking. 75 minutes of vigorous exercise, such as running. General information Know how many calories are in the foods you eat most often. This will help you calculate calorie counts faster. Find a way of tracking calories that works for you. Get creative. Try different apps or programs if writing down calories does not work for you. What foods should I eat?  Eat nutritious foods. It is better to have a nutritious, high-calorie food, such as an avocado, than a food with  few nutrients, such as a bag of potato chips. Use your calories on foods and drinks that will fill you up and will not leave you hungry soon after eating. Examples of foods that fill you up are nuts and nut butters, vegetables, lean proteins, and high-fiber foods such as whole grains. High-fiber foods are foods with more than 5 g of fiber per serving. Pay attention to calories in drinks. Low-calorie drinks include water and unsweetened drinks. The items listed above may not be a complete list of foods and beverages you can eat. Contact a dietitian for more information. What foods should I limit? Limit foods or drinks that are not good sources of vitamins, minerals, or protein or that are high in unhealthy fats. These include:  Candy. Other sweets. Sodas, specialty coffee drinks, alcohol, and juice. The items listed above may not be a complete list of foods and beverages you should avoid. Contact a dietitian for more information. How do I count calories when eating out? Pay attention to portions. Often, portions are much larger when eating out. Try these tips to keep portions smaller: Consider sharing a meal instead of getting your own. If you get your own meal, eat only half of it. Before you start eating, ask for a container and put half of your meal into it. When available, consider ordering smaller portions from the menu instead of full portions. Pay attention to your food and drink choices. Knowing the way food is cooked and what is included with the meal can help you eat fewer calories. If calories are listed on the menu, choose the lower-calorie options. Choose dishes that include vegetables, fruits, whole grains, low-fat dairy products, and lean proteins. Choose items that are boiled, broiled, grilled, or steamed. Avoid items that are buttered, battered, fried, or served with cream sauce. Items labeled as crispy are usually fried, unless stated otherwise. Choose water, low-fat milk,  unsweetened iced tea, or other drinks without added sugar. If you want an alcoholic beverage, choose a lower-calorie option, such as a glass of wine or light beer. Ask for dressings, sauces, and syrups on the side. These are usually high in calories, so you should limit the amount you eat. If you want a salad, choose a garden salad and ask for grilled meats. Avoid extra toppings such as bacon, cheese, or fried items. Ask for the dressing on the side, or ask for olive oil and vinegar or lemon to use as dressing. Estimate how many servings of a food you are given. Knowing serving sizes will help you be aware of how much food you are eating at restaurants. Where to find more information Centers for Disease Control and Prevention: http://www.wolf.info/ [place] Department of Agriculture: http://www.wilson-mendoza.org/ Summary Calorie counting means keeping track of how many calories you eat and drink each day. If you eat fewer calories than your body needs, you should lose weight. A healthy amount of weight to lose per week is usually 1-2 lb (0.5-0.9 kg). This usually means reducing your daily calorie intake by 500-750 calories. The number of calories in a food can be found on a Nutrition Facts label. If a food does not have a Nutrition Facts label, try to look up the calories online or ask your dietitian for help. Use smaller plates, glasses, and bowls for smaller portions and to prevent overeating. Use your calories on foods and drinks that will fill you up and not leave you hungry shortly after a meal. This information is not intended to replace advice given to you by your health care provider. Make sure you discuss any questions you have with your health care provider. Document Revised: 09/21/2019 Document Reviewed: 09/21/2019 Elsevier Patient Education  2022 [place] and Cholesterol Restricted Eating Plan Getting too much fat and cholesterol in your diet may cause health problems. Choosing the right foods helps keep  your fat and cholesterol at normal levels. This can keep you from getting certain diseases. Your doctor may recommend an eating plan that includes: Total fat: ______% or less of total calories a day. Saturated fat: ______% or less of total calories a day. Cholesterol: less than _________mg a day. Fiber: ______g a day. What are tips for following this plan? Meal planning At meals, divide your plate  into four equal parts: Fill one-half of your plate with vegetables and green salads. Fill one-fourth of your plate with whole grains. Fill one-fourth of your plate with low-fat (lean) protein foods. Eat fish that is high in omega-3 fats at least two times a week. This includes mackerel, tuna, sardines, and salmon. Eat foods that are high in fiber, such as whole grains, beans, apples, broccoli, carrots, peas, and barley. General tips  Work with your doctor to lose weight if you need to. Avoid: Foods with added sugar. Fried foods. Foods with partially hydrogenated oils. Limit alcohol intake to no more than 1 drink a day for nonpregnant women and 2 drinks a day for men. One drink equals 12 oz of beer, 5 oz of wine, or 1 oz of hard liquor. Reading food labels Check food labels for: Trans fats. Partially hydrogenated oils. Saturated fat (g) in each serving. Cholesterol (mg) in each serving. Fiber (g) in each serving. Choose foods with healthy fats, such as: Monounsaturated fats. Polyunsaturated fats. Omega-3 fats. Choose grain products that have whole grains. Look for the word "whole" as the first word in the ingredient list. Cooking Cook foods using low-fat methods. These include baking, boiling, grilling, and broiling. Eat more home-cooked foods. Eat at restaurants and buffets less often. Avoid cooking using saturated fats, such as butter, cream, palm oil, palm kernel oil, and coconut oil. Recommended foods Fruits All fresh, canned (in natural juice), or frozen  fruits. Vegetables Fresh or frozen vegetables (raw, steamed, roasted, or grilled). Green salads. Grains Whole grains, such as whole wheat or whole grain breads, crackers, cereals, and pasta. Unsweetened oatmeal, bulgur, barley, quinoa, or brown rice. Corn or whole wheat flour tortillas. Meats and other protein foods Ground beef (85% or leaner), grass-fed beef, or beef trimmed of fat. Skinless chicken or [place]. Ground chicken or [place]. Pork trimmed of fat. All fish and seafood. Egg whites. Dried beans, peas, or lentils. Unsalted nuts or seeds. Unsalted canned beans. Nut butters without added sugar or oil. Dairy Low-fat or nonfat dairy products, such as skim or 1% milk, 2% or reduced-fat cheeses, low-fat and fat-free ricotta or cottage cheese, or plain low-fat and nonfat yogurt. Fats and oils Tub margarine without trans fats. Light or reduced-fat mayonnaise and salad dressings. Avocado. Olive, canola, sesame, or safflower oils. The items listed above may not be a complete list of foods and beverages you can eat. Contact a dietitian for more information. Foods to avoid Fruits Canned fruit in heavy syrup. Fruit in cream or butter sauce. Fried fruit. Vegetables Vegetables cooked in cheese, cream, or butter sauce. Fried vegetables. Grains White bread. White pasta. White rice. Cornbread. Bagels, pastries, and croissants. Crackers and snack foods that contain trans fat and hydrogenated oils. Meats and other protein foods Fatty cuts of meat. Ribs, chicken wings, bacon, sausage, bologna, salami, chitterlings, fatback, hot dogs, bratwurst, and packaged lunch meats. Liver and organ meats. Whole eggs and egg yolks. Chicken and [place] with skin. Fried meat. Dairy Whole or 2% milk, cream, half-and-half, and cream cheese. Whole milk cheeses. Whole-fat or sweetened yogurt. Full-fat cheeses. Nondairy creamers and whipped toppings. Processed cheese, cheese spreads, and cheese curds. Beverages Alcohol.  Sugar-sweetened drinks such as sodas, lemonade, and fruit drinks. Fats and oils Butter, stick margarine, lard, shortening, ghee, or bacon fat. Coconut, palm kernel, and palm oils. Sweets and desserts Corn syrup, sugars, honey, and molasses. Candy. Jam and jelly. Syrup. Sweetened cereals. Cookies, pies, cakes, donuts, muffins, and ice cream. The items listed above  may not be a complete list of foods and beverages you should avoid. Contact a dietitian for more information. Summary Choosing the right foods helps keep your fat and cholesterol at normal levels. This can keep you from getting certain diseases. At meals, fill one-half of your plate with vegetables and green salads. Eat high-fiber foods, like whole grains, beans, apples, carrots, peas, and barley. Limit added sugar, saturated fats, alcohol, and fried foods. This information is not intended to replace advice given to you by your health care provider. Make sure you discuss any questions you have with your health care provider. Document Revised: 12/13/2019 Document Reviewed: 12/13/2019 Elsevier Patient Education  2022 [employer]  Fat and Cholesterol Restricted Eating Plan Getting too much fat and cholesterol in your diet may cause health problems. Choosing the right foods helps keep your fat and cholesterol at normal levels. This can keep you from getting certain diseases. Your doctor may recommend an eating plan that includes: Total fat: ______% or less of total calories a day. Saturated fat: ______% or less of total calories a day. Cholesterol: less than _________mg a day. Fiber: ______g a day. What are tips for following this plan? Meal planning At meals, divide your plate into four equal parts: Fill one-half of your plate with vegetables and green salads. Fill one-fourth of your plate with whole grains. Fill one-fourth of your plate with low-fat (lean) protein foods. Eat fish that is high in omega-3 fats at least two times a  week. This includes mackerel, tuna, sardines, and salmon. Eat foods that are high in fiber, such as whole grains, beans, apples, broccoli, carrots, peas, and barley. General tips  Work with your doctor to lose weight if you need to. Avoid: Foods with added sugar. Fried foods. Foods with partially hydrogenated oils. Limit alcohol intake to no more than 1 drink a day for nonpregnant women and 2 drinks a day for men. One drink equals 12 oz of beer, 5 oz of wine, or 1 oz of hard liquor. Reading food labels Check food labels for: Trans fats. Partially hydrogenated oils. Saturated fat (g) in each serving. Cholesterol (mg) in each serving. Fiber (g) in each serving. Choose foods with healthy fats, such as: Monounsaturated fats. Polyunsaturated fats. Omega-3 fats. Choose grain products that have whole grains. Look for the word "whole" as the first word in the ingredient list. Cooking Cook foods using low-fat methods. These include baking, boiling, grilling, and broiling. Eat more home-cooked foods. Eat at restaurants and buffets less often. Avoid cooking using saturated fats, such as butter, cream, palm oil, palm kernel oil, and coconut oil. Recommended foods Fruits All fresh, canned (in natural juice), or frozen fruits. Vegetables Fresh or frozen vegetables (raw, steamed, roasted, or grilled). Green salads. Grains Whole grains, such as whole wheat or whole grain breads, crackers, cereals, and pasta. Unsweetened oatmeal, bulgur, barley, quinoa, or brown rice. Corn or whole wheat flour tortillas. Meats and other protein foods Ground beef (85% or leaner), grass-fed beef, or beef trimmed of fat. Skinless chicken or [place]. Ground chicken or [place]. Pork trimmed of fat. All fish and seafood. Egg whites. Dried beans, peas, or lentils. Unsalted nuts or seeds. Unsalted canned beans. Nut butters without added sugar or oil. Dairy Low-fat or nonfat dairy products, such as skim or 1% milk, 2% or  reduced-fat cheeses, low-fat and fat-free ricotta or cottage cheese, or plain low-fat and nonfat yogurt. Fats and oils Tub margarine without trans fats. Light or reduced-fat mayonnaise and salad dressings.  Avocado. Olive, canola, sesame, or safflower oils. The items listed above may not be a complete list of foods and beverages you can eat. Contact a dietitian for more information. Foods to avoid Fruits Canned fruit in heavy syrup. Fruit in cream or butter sauce. Fried fruit. Vegetables Vegetables cooked in cheese, cream, or butter sauce. Fried vegetables. Grains White bread. White pasta. White rice. Cornbread. Bagels, pastries, and croissants. Crackers and snack foods that contain trans fat and hydrogenated oils. Meats and other protein foods Fatty cuts of meat. Ribs, chicken wings, bacon, sausage, bologna, salami, chitterlings, fatback, hot dogs, bratwurst, and packaged lunch meats. Liver and organ meats. Whole eggs and egg yolks. Chicken and [place] with skin. Fried meat. Dairy Whole or 2% milk, cream, half-and-half, and cream cheese. Whole milk cheeses. Whole-fat or sweetened yogurt. Full-fat cheeses. Nondairy creamers and whipped toppings. Processed cheese, cheese spreads, and cheese curds. Beverages Alcohol. Sugar-sweetened drinks such as sodas, lemonade, and fruit drinks. Fats and oils Butter, stick margarine, lard, shortening, ghee, or bacon fat. Coconut, palm kernel, and palm oils. Sweets and desserts Corn syrup, sugars, honey, and molasses. Candy. Jam and jelly. Syrup. Sweetened cereals. Cookies, pies, cakes, donuts, muffins, and ice cream. The items listed above may not be a complete list of foods and beverages you should avoid. Contact a dietitian for more information. Summary Choosing the right foods helps keep your fat and cholesterol at normal levels. This can keep you from getting certain diseases. At meals, fill one-half of your plate with vegetables and green salads. Eat  high-fiber foods, like whole grains, beans, apples, carrots, peas, and barley. Limit added sugar, saturated fats, alcohol, and fried foods. This information is not intended to replace advice given to you by your health care provider. Make sure you discuss any questions you have with your health care provider. Document Revised: 12/13/2019 Document Reviewed: 12/13/2019 Elsevier Patient Education  2022 [employer].

## 2021-06-16 NOTE — Progress Notes (Signed)
Established Patient Office Visit  Subjective:  Patient ID: Caitlin Bryant, female    DOB: 1981-06-06  Age: 40 y.o. MRN: [id number]  CC:  Chief Complaint  Patient presents with   Follow-up    HPI Caitlin Bryant presents for follow up visit.  - slightly improved right hip pain. Still having pain in right groin with exertion. Does have appointment with orthopedics coming up on Friday. Will discuss this further with them.  Sstates that she has been doing ok with sertraline 8m tablets. Last few weeks have been a bit more difficult. Finds that she has been a bit more easily irritated and crying more frequently. She feels that maybe this dose Is not working quite as well as it was before when first starting the medication.  She has no new concerns or complaints.  She would like to get herr flu hot today.   Past Medical History:  Diagnosis Date   MRSA (methicillin resistant staph aureus) culture positive     Past Surgical History:  Procedure Laterality Date   CESAREAN SECTION  2004 and 2008   x 2    Family History  Problem Relation Age of Onset   Hypertension Mother    Diabetes Mother    Osteoarthritis Mother    Fibromyalgia Mother    Heart attack Father        Defibrillator    Social History   Socioeconomic History   Marital status: Married    Spouse name: Not on file   Number of children: Not on file   Years of education: Not on file   Highest education level: Not on file  Occupational History   Not on file  Tobacco Use   Smoking status: Never   Smokeless tobacco: Never  Vaping Use   Vaping Use: Never used  Substance and Sexual Activity   Alcohol use: Yes   Drug use: No   Sexual activity: Yes    Partners: Male  Other Topics Concern   Not on file  Social History Narrative   Not on file   Social Determinants of Health   Financial Resource Strain: Not on file  Food Insecurity: Not on file  Transportation Needs: Not on file  Physical Activity:  Not on file  Stress: Not on file  Social Connections: Not on file  Intimate Partner Violence: Not on file    Outpatient Medications Prior to Visit  Medication Sig Dispense Refill   cetirizine (ZYRTEC) 10 MG tablet Take 10 mg by mouth daily.     fluticasone (FLONASE) 50 MCG/ACT nasal spray Place into both nostrils daily.     methylPREDNISolone (MEDROL) 4 MG TBPK tablet Take by mouth as directed for 6 days 21 tablet 0   ondansetron (ZOFRAN ODT) 4 MG disintegrating tablet Take 1 tablet (4 mg total) by mouth every 8 (eight) hours as needed for nausea or vomiting. 20 tablet 0   sertraline (ZOLOFT) 25 MG tablet Take 2 tablets (50 mg total) by mouth daily. 60 tablet 2   diclofenac (VOLTAREN) 50 MG EC tablet Take 1 tablet (50 mg total) by mouth 2 (two) times daily. (Patient not taking: Reported on 06/16/2021) 45 tablet 1   No facility-administered medications prior to visit.    Allergies  Allergen Reactions   Sumatriptan Nausea And Vomiting    Severe nausea and vomiting   Sulfa Antibiotics Rash    ROS Review of Systems  Constitutional:  Negative for activity change, appetite change, chills, fatigue and fever.  Activity level slightly Improved from last visit .  HENT:  Negative for congestion, postnasal drip, rhinorrhea, sinus pressure, sinus pain, sneezing and sore throat.   Eyes: Negative.   Respiratory:  Negative for cough, chest tightness, shortness of breath and wheezing.   Cardiovascular:  Negative for chest pain and palpitations.  Gastrointestinal:  Negative for abdominal pain, constipation, diarrhea, nausea and vomiting.  Endocrine: Negative for cold intolerance, heat intolerance, polydipsia and polyuria.  Genitourinary:  Negative for dyspareunia, dysuria, flank pain, frequency and urgency.  Musculoskeletal:  Positive for arthralgias, back pain, gait problem and myalgias.  Skin:  Negative for rash.  Allergic/Immunologic: Negative for environmental allergies.  Neurological:   Negative for dizziness, weakness and headaches.  Hematological:  Negative for adenopathy.  Psychiatric/Behavioral:  Positive for dysphoric mood. The patient is nervous/anxious.      Objective:    Physical Exam Vitals and nursing note reviewed.  Constitutional:      Appearance: Normal appearance. She is well-developed.  HENT:     Head: Normocephalic and atraumatic.     Nose: Nose normal.     Mouth/Throat:     Mouth: Mucous membranes are moist.  Eyes:     Extraocular Movements: Extraocular movements intact.     Conjunctiva/sclera: Conjunctivae normal.     Pupils: Pupils are equal, round, and reactive to light.  Cardiovascular:     Rate and Rhythm: Normal rate and regular rhythm.     Pulses: Normal pulses.     Heart sounds: Normal heart sounds.  Pulmonary:     Effort: Pulmonary effort is normal.     Breath sounds: Normal breath sounds.  Abdominal:     Palpations: Abdomen is soft.  Musculoskeletal:        General: Normal range of motion.     Cervical back: Normal range of motion and neck supple.     Comments: Improvied right hip pain. There is palpable area of tenderness just above right posterior iliac crest.  No swelling, no bruising, and no bony abnormalities or deformities can be felt today.  Patient is walking with a limp, favoring the right side. This has improved since her most recent visit   Lymphadenopathy:     Cervical: No cervical adenopathy.  Skin:    General: Skin is warm and dry.     Capillary Refill: Capillary refill takes less than 2 seconds.  Neurological:     General: No focal deficit present.     Mental Status: She is alert and oriented to person, place, and time.  Psychiatric:        Attention and Perception: Attention and perception normal.        Mood and Affect: Affect normal. Mood is anxious and depressed.        Speech: Speech normal.        Behavior: Behavior normal. Behavior is cooperative.        Thought Content: Thought content normal.         Cognition and Memory: Cognition and memory normal.        Judgment: Judgment normal.    Today's Vitals   06/16/21 1552  BP: 104/69  Pulse: 63  Temp: 97.8 F (36.6 C)  SpO2: 100%  Weight: 146 lb 4.8 oz (66.4 kg)  Height: 5' 3"  (1.6 m)   Body mass index is 25.92 kg/m.   Wt Readings from Last 3 Encounters:  06/16/21 146 lb 4.8 oz (66.4 kg)  06/12/21 146 lb 4.8 oz (66.4 kg)  05/30/21 144  lb 1.6 oz (65.4 kg)     Health Maintenance Due  Topic Date Due   HIV Screening  Never done   Hepatitis C Screening  Never done   PAP SMEAR-Modifier  01/12/2020   COVID-19 Vaccine (3 - Booster for Pfizer series) 01/20/2020    There are no preventive care reminders to display for this patient.  Lab Results  Component Value Date   TSH 0.768 03/13/2021   Lab Results  Component Value Date   WBC 7.5 03/13/2021   HGB 13.7 03/13/2021   HCT 41.1 03/13/2021   MCV 90 03/13/2021   PLT 210 03/13/2021   Lab Results  Component Value Date   NA 139 03/13/2021   K 4.3 03/13/2021   CO2 22 03/13/2021   GLUCOSE 81 03/13/2021   BUN 13 03/13/2021   CREATININE 0.63 03/13/2021   BILITOT 0.4 03/13/2021   ALKPHOS 48 03/13/2021   AST 7 03/13/2021   ALT 4 03/13/2021   PROT 7.3 03/13/2021   ALBUMIN 4.4 03/13/2021   CALCIUM 8.8 03/13/2021   EGFR 116 03/13/2021   GFR 144.81 06/22/2017   Lab Results  Component Value Date   CHOL 134 03/13/2021   Lab Results  Component Value Date   HDL 45 03/13/2021   Lab Results  Component Value Date   LDLCALC 69 03/13/2021   Lab Results  Component Value Date   TRIG 112 03/13/2021   Lab Results  Component Value Date   CHOLHDL 3.0 03/13/2021   Lab Results  Component Value Date   HGBA1C 5.2 03/13/2021      Assessment & Plan:  1. Acute right-sided low back pain with right-sided sciatica Patient continuing to have right-sided low back pain with sciatica radiating into the right hip and upper leg.  This has improved since her most recent visit.  She  does plan to bring this up with orthopedic provider at appointment this coming Friday.  2. Generalized anxiety disorder Will increase sertraline to 50 mg daily for now.  A new prescription was sent to her pharmacy today.  We will reassess at next visit and consider weaning back to lower dose if indicated. - sertraline (ZOLOFT) 25 MG tablet; Take 2 tablets (50 mg total) by mouth daily.  Dispense: 60 tablet; Refill: 2  3. Need for influenza vaccination Flu vaccine administered during today's visit. - Flu Vaccine QUAD 6+ mos PF IM (Fluarix Quad PF)  4. Overweight with body mass index (BMI) 25.0-29.9 Discussed lowering calorie intake to 1500 calories per day and incorporating exercise into daily routine to help lose weight. Will monitor.    Problem List Items Addressed This Visit       Nervous and Auditory   Acute right-sided low back pain with right-sided sciatica - Primary   Relevant Medications   sertraline (ZOLOFT) 25 MG tablet     Other   Generalized anxiety disorder   Relevant Medications   sertraline (ZOLOFT) 25 MG tablet   Overweight with body mass index (BMI) 25.0-29.9   Other Visit Diagnoses     Need for influenza vaccination       Relevant Orders   Flu Vaccine QUAD 6+ mos PF IM (Fluarix Quad PF) (Completed)       Meds ordered this encounter  Medications   sertraline (ZOLOFT) 25 MG tablet    Sig: Take 2 tablets (50 mg total) by mouth daily.    Dispense:  60 tablet    Refill:  2    Please note increased  dosing to two tablets daily    Order Specific Question:   Supervising Provider    Answer:   Beatrice Lecher D [[id number]]     Follow-up: Return in about 3 months (around 09/16/2021) for mood.    Ronnell Freshwater, NP  This note was dictated using Systems analyst. Rapid proofreading was performed to expedite the delivery of the information. Despite proofreading, phonetic errors will occur which are common with this voice recognition software.  Please take this into consideration. If there are any concerns, please contact our office.

## 2021-06-25 DIAGNOSIS — E669 Obesity, unspecified: Secondary | ICD-10-CM | POA: Insufficient documentation

## 2021-06-25 DIAGNOSIS — E663 Overweight: Secondary | ICD-10-CM | POA: Insufficient documentation

## 2021-07-07 ENCOUNTER — Ambulatory Visit (INDEPENDENT_AMBULATORY_CARE_PROVIDER_SITE_OTHER): Payer: 59 | Admitting: Nurse Practitioner

## 2021-07-07 ENCOUNTER — Other Ambulatory Visit: Payer: Self-pay

## 2021-07-07 ENCOUNTER — Encounter: Payer: Self-pay | Admitting: Nurse Practitioner

## 2021-07-07 VITALS — BP 117/75 | HR 72 | Temp 98.2°F | Ht 63.0 in | Wt 153.8 lb

## 2021-07-07 DIAGNOSIS — G43909 Migraine, unspecified, not intractable, without status migrainosus: Secondary | ICD-10-CM | POA: Diagnosis not present

## 2021-07-07 MED ORDER — RIZATRIPTAN BENZOATE 10 MG PO TABS: 10.0000 mg | ORAL_TABLET | ORAL | 1 refills | Status: DC | PRN

## 2021-07-07 NOTE — Progress Notes (Signed)
Acute Office Visit  Subjective:    Patient ID: Caitlin Bryant, female    DOB: 1980-09-29, 40 y.o.   MRN: [id number]  Chief Complaint  Patient presents with   Headache    The patient does have history of migraine headaches. Usually able to tike excedrin and this is effective. This headache was not responsive to excedrin.   Headache  This is a new problem. The current episode started in the past 7 days. The problem occurs constantly. The problem has been gradually improving. The pain is located in the Left unilateral region. The pain does not radiate. The pain quality is similar to prior headaches (worse than other migraines). The quality of the pain is described as shooting, stabbing, throbbing and aching. The pain is severe. Associated symptoms include dizziness, a loss of balance, nausea, scalp tenderness, sinus pressure and vomiting. Pertinent negatives include no abdominal pain, back pain, coughing, fever, rhinorrhea, sore throat or weakness. The symptoms are aggravated by activity, bright light and emotional stress. She has tried acetaminophen, Excedrin, NSAIDs and darkened room for the symptoms. The treatment provided no relief. Her past medical history is significant for migraine headaches.    Past Medical History:  Diagnosis Date   MRSA (methicillin resistant staph aureus) culture positive     Past Surgical History:  Procedure Laterality Date   CESAREAN SECTION  2004 and 2008   x 2    Family History  Problem Relation Age of Onset   Hypertension Mother    Diabetes Mother    Osteoarthritis Mother    Fibromyalgia Mother    Heart attack Father        Defibrillator    Social History   Socioeconomic History   Marital status: Married    Spouse name: Not on file   Number of children: Not on file   Years of education: Not on file   Highest education level: Not on file  Occupational History   Not on file  Tobacco Use   Smoking status: Never   Smokeless tobacco:  Never  Vaping Use   Vaping Use: Never used  Substance and Sexual Activity   Alcohol use: Yes   Drug use: No   Sexual activity: Yes    Partners: Male  Other Topics Concern   Not on file  Social History Narrative   Not on file   Social Determinants of Health   Financial Resource Strain: Not on file  Food Insecurity: Not on file  Transportation Needs: Not on file  Physical Activity: Not on file  Stress: Not on file  Social Connections: Not on file  Intimate Partner Violence: Not on file    Outpatient Medications Prior to Visit  Medication Sig Dispense Refill   cetirizine (ZYRTEC) 10 MG tablet Take 10 mg by mouth daily.     fluticasone (FLONASE) 50 MCG/ACT nasal spray Place into both nostrils daily.     methylPREDNISolone (MEDROL) 4 MG TBPK tablet Take by mouth as directed for 6 days 21 tablet 0   ondansetron (ZOFRAN ODT) 4 MG disintegrating tablet Take 1 tablet (4 mg total) by mouth every 8 (eight) hours as needed for nausea or vomiting. 20 tablet 0   sertraline (ZOLOFT) 25 MG tablet Take 2 tablets (50 mg total) by mouth daily. 60 tablet 2   No facility-administered medications prior to visit.    Allergies  Allergen Reactions   Sumatriptan Nausea And Vomiting    Severe nausea and vomiting   Sulfa Antibiotics Rash  Review of Systems  Constitutional:  Positive for fatigue. Negative for activity change, appetite change, chills and fever.  HENT:  Positive for sinus pressure. Negative for congestion, postnasal drip, rhinorrhea, sinus pain, sneezing and sore throat.        Facial pain. Pain behind the left eye.   Eyes: Negative.   Respiratory:  Negative for cough, chest tightness, shortness of breath and wheezing.   Cardiovascular:  Negative for chest pain and palpitations.  Gastrointestinal:  Positive for nausea and vomiting. Negative for abdominal pain, constipation and diarrhea.  Endocrine: Negative for cold intolerance, heat intolerance, polydipsia and polyuria.   Genitourinary:  Negative for dyspareunia, dysuria, flank pain, frequency and urgency.  Musculoskeletal:  Negative for arthralgias, back pain and myalgias.  Skin:  Negative for rash.  Allergic/Immunologic: Negative for environmental allergies.  Neurological:  Positive for dizziness, headaches and loss of balance. Negative for weakness.  Hematological:  Negative for adenopathy.  Psychiatric/Behavioral:  The patient is not nervous/anxious.       Objective:    Physical Exam Vitals and nursing note reviewed.  Constitutional:      Appearance: Normal appearance. She is well-developed.  HENT:     Head: Normocephalic and atraumatic.  Eyes:     Extraocular Movements: Extraocular movements intact.     Right eye: Normal extraocular motion and no nystagmus.     Left eye: Normal extraocular motion and no nystagmus.     Pupils: Pupils are equal, round, and reactive to light.  Cardiovascular:     Rate and Rhythm: Normal rate and regular rhythm.     Pulses: Normal pulses.     Heart sounds: Normal heart sounds.  Pulmonary:     Effort: Pulmonary effort is normal.     Breath sounds: Normal breath sounds.  Abdominal:     Palpations: Abdomen is soft.  Musculoskeletal:        General: Normal range of motion.     Cervical back: Normal range of motion and neck supple.  Lymphadenopathy:     Cervical: No cervical adenopathy.  Skin:    General: Skin is warm and dry.     Capillary Refill: Capillary refill takes less than 2 seconds.  Neurological:     General: No focal deficit present.     Mental Status: She is alert and oriented to person, place, and time.     GCS: GCS eye subscore is 4. GCS verbal subscore is 5. GCS motor subscore is 6.  Psychiatric:        Mood and Affect: Mood normal.        Behavior: Behavior normal.        Thought Content: Thought content normal.        Judgment: Judgment normal.    Today's Vitals   07/07/21 1058  BP: 117/75  Pulse: 72  Temp: 98.2 F (36.8 C)  SpO2:  99%  Weight: 153 lb 12.8 oz (69.8 kg)  Height: 5' 3" (1.6 m)   Body mass index is 27.24 kg/m.   Wt Readings from Last 3 Encounters:  07/07/21 153 lb 12.8 oz (69.8 kg)  06/16/21 146 lb 4.8 oz (66.4 kg)  06/12/21 146 lb 4.8 oz (66.4 kg)    Health Maintenance Due  Topic Date Due   HIV Screening  Never done   Hepatitis C Screening  Never done   PAP SMEAR-Modifier  01/12/2020   COVID-19 Vaccine (3 - Booster for Pfizer series) 01/20/2020    There are no preventive care reminders  to display for this patient.   Lab Results  Component Value Date   TSH 0.768 03/13/2021   Lab Results  Component Value Date   WBC 7.5 03/13/2021   HGB 13.7 03/13/2021   HCT 41.1 03/13/2021   MCV 90 03/13/2021   PLT 210 03/13/2021   Lab Results  Component Value Date   NA 139 03/13/2021   K 4.3 03/13/2021   CO2 22 03/13/2021   GLUCOSE 81 03/13/2021   BUN 13 03/13/2021   CREATININE 0.63 03/13/2021   BILITOT 0.4 03/13/2021   ALKPHOS 48 03/13/2021   AST 7 03/13/2021   ALT 4 03/13/2021   PROT 7.3 03/13/2021   ALBUMIN 4.4 03/13/2021   CALCIUM 8.8 03/13/2021   EGFR 116 03/13/2021   GFR 144.81 06/22/2017   Lab Results  Component Value Date   CHOL 134 03/13/2021   Lab Results  Component Value Date   HDL 45 03/13/2021   Lab Results  Component Value Date   LDLCALC 69 03/13/2021   Lab Results  Component Value Date   TRIG 112 03/13/2021   Lab Results  Component Value Date   CHOLHDL 3.0 03/13/2021   Lab Results  Component Value Date   HGBA1C 5.2 03/13/2021       Assessment & Plan:  1. Acute confusional migraine Trial maxalt 66m tablets to relieve acute migraines. May take one and repeat the dose in two hours for continuous migraines if complete relief is not achieved after first dose. May continue to take excedrin if needed.  - rizatriptan (MAXALT) 10 MG tablet; Take 1 tablet (10 mg total) by mouth as needed for migraine. May repeat in 2 hours if needed  Dispense: 10 tablet;  Refill: 1   Problem List Items Addressed This Visit       Cardiovascular and Mediastinum   Acute confusional migraine - Primary   Relevant Medications   rizatriptan (MAXALT) 10 MG tablet     Meds ordered this encounter  Medications   rizatriptan (MAXALT) 10 MG tablet    Sig: Take 1 tablet (10 mg total) by mouth as needed for migraine. May repeat in 2 hours if needed    Dispense:  10 tablet    Refill:  1    Order Specific Question:   Supervising Provider    Answer:   TSilverio Decamp[[id number]]     HRonnell Freshwater NP

## 2021-07-11 ENCOUNTER — Telehealth: Payer: Self-pay | Admitting: Nurse Practitioner

## 2021-07-11 NOTE — Telephone Encounter (Signed)
Patient called office stating that she is having chest pain that radiates down her left arm. She states her BP is within normal range and denies any SOB, headache, dizziness. Patient requesting to be seen.   I advised patient she should go to ED for evaluation and treatment or call EMS. I advised patient she would need an extensive work up to determine if she was experiencing cardiac related symptoms. Patient verbalized understanding and was agreeable. AS< CMA

## 2021-08-06 ENCOUNTER — Other Ambulatory Visit: Payer: Self-pay | Admitting: Orthopaedic Surgery

## 2021-08-07 ENCOUNTER — Other Ambulatory Visit: Payer: Self-pay

## 2021-08-07 ENCOUNTER — Encounter (HOSPITAL_BASED_OUTPATIENT_CLINIC_OR_DEPARTMENT_OTHER): Payer: Self-pay | Admitting: Orthopaedic Surgery

## 2021-08-11 NOTE — Progress Notes (Signed)

## 2021-08-16 IMAGING — CT CT RENAL STONE PROTOCOL
2 of 4 series · 16 of 46 positions shown, 18 images · non-contrast
Comparison: None.

CLINICAL DATA: Worsening left flank pain for several weeks. Nausea.

EXAM:
CT ABDOMEN AND PELVIS WITHOUT CONTRAST
TECHNIQUE: Multidetector CT imaging of the abdomen and pelvis was performed
following the standard protocol without IV contrast.

[Series 2: stone study 5.0 i30f 1 · axial · 0.68mm/px · z∈[-457,-32]mm · 13 of 93 slices shown, 15 images]
[im 4/93  soft-tissue]
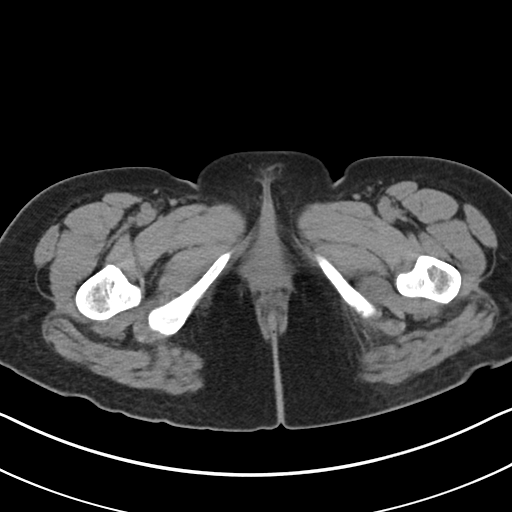
[im 4/93  bone]
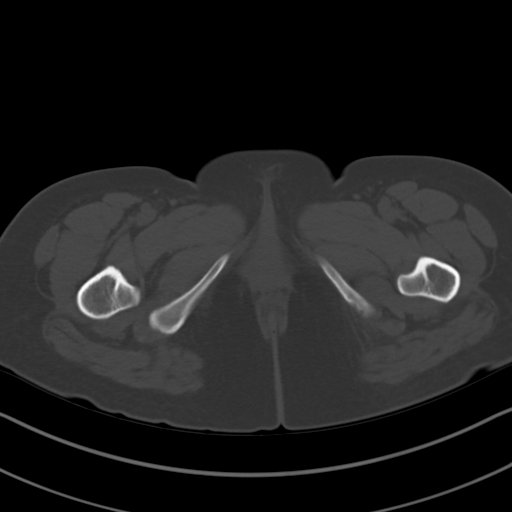
[im 12/93  soft-tissue]
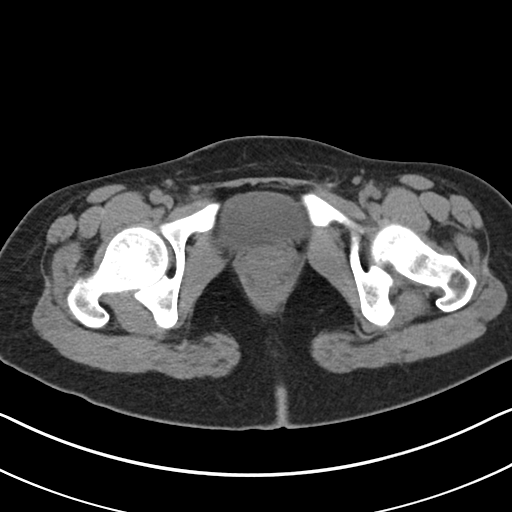
[im 19/93  soft-tissue]
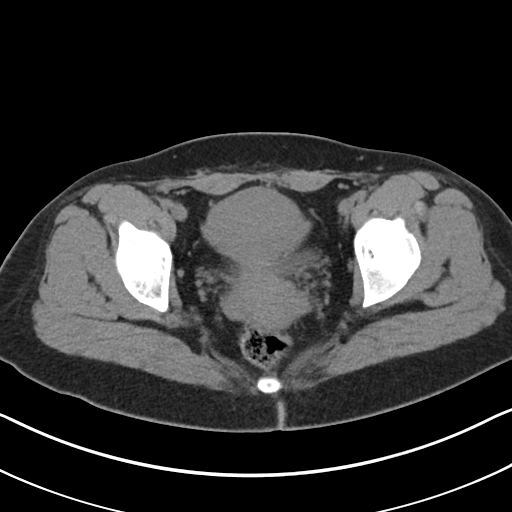
[im 26/93  soft-tissue]
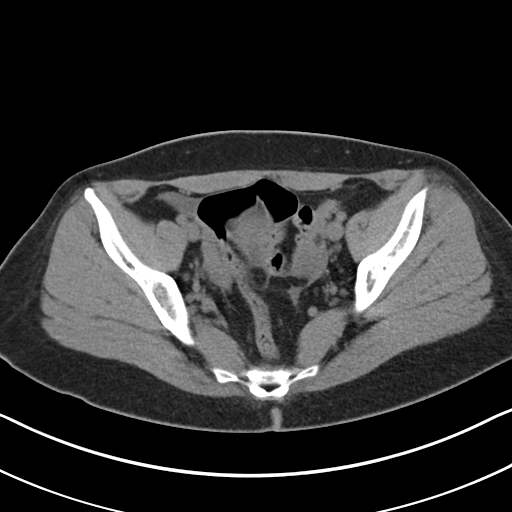
[im 34/93  soft-tissue]
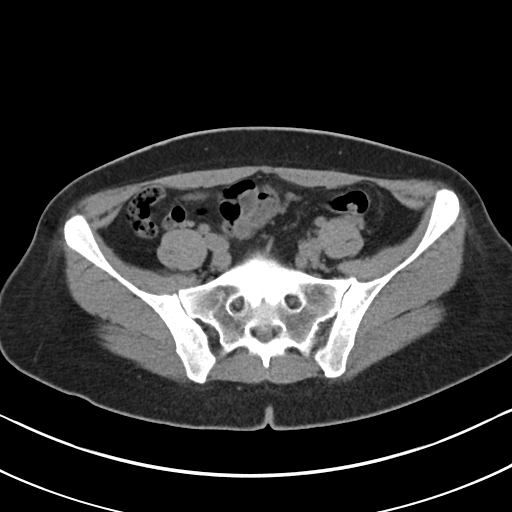
[im 41/93  soft-tissue]
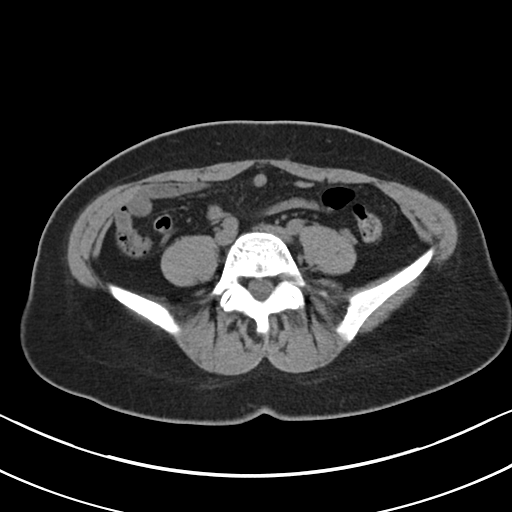
[im 48/93  soft-tissue]
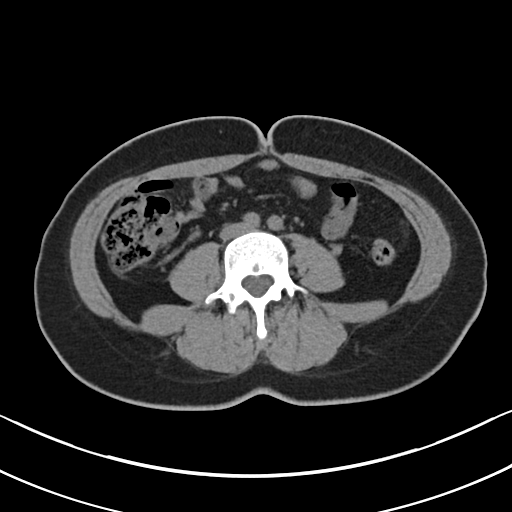
[im 52/93  soft-tissue]
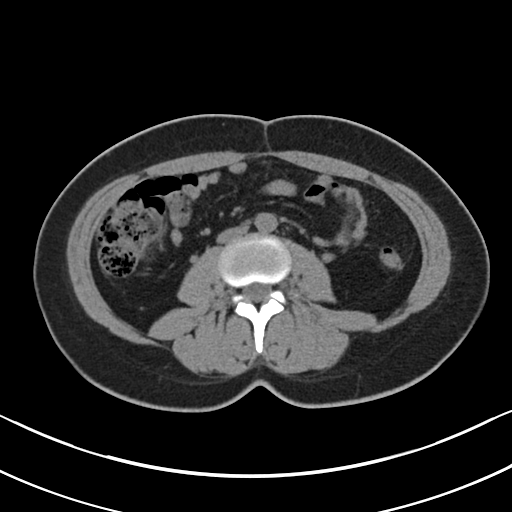
[im 59/93  soft-tissue]
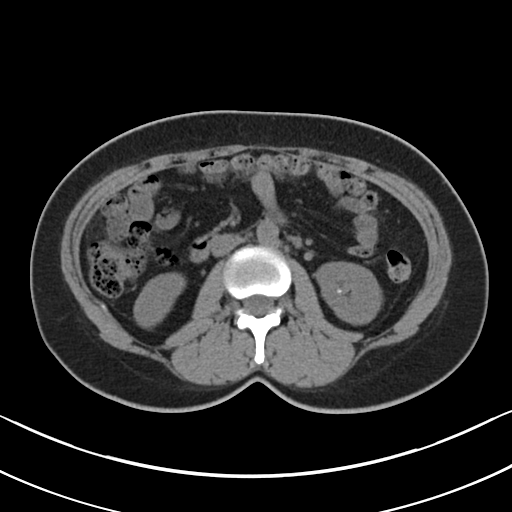
[im 59/93  bone]
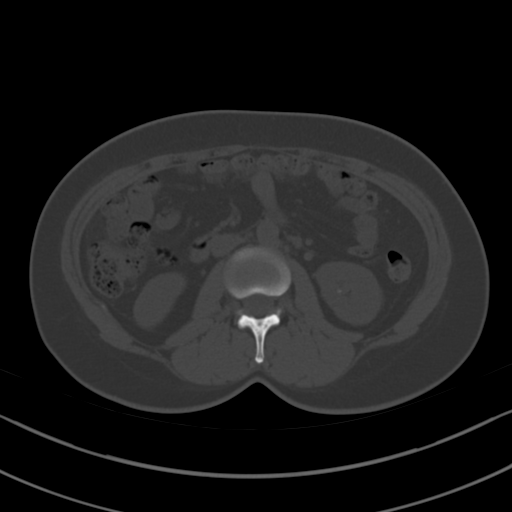
[im 67/93  soft-tissue]
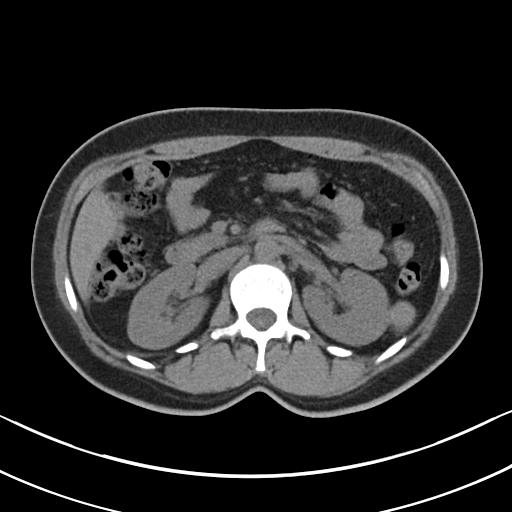
[im 74/93  soft-tissue]
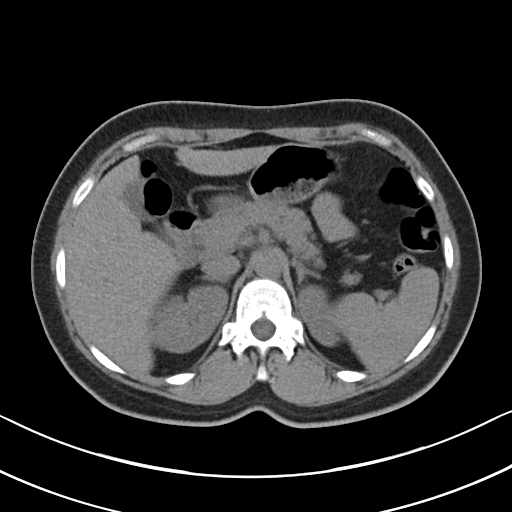
[im 81/93  soft-tissue]
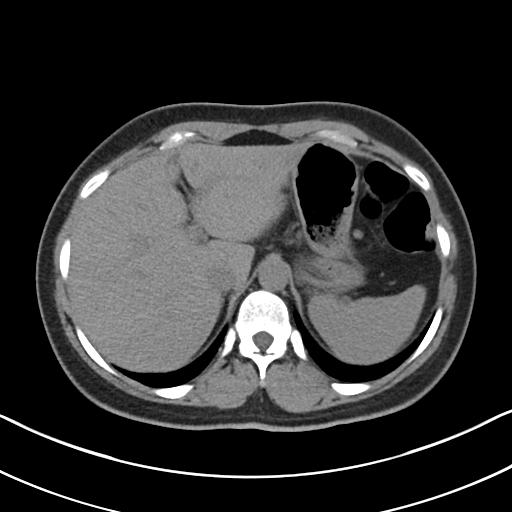
[im 89/93  soft-tissue]
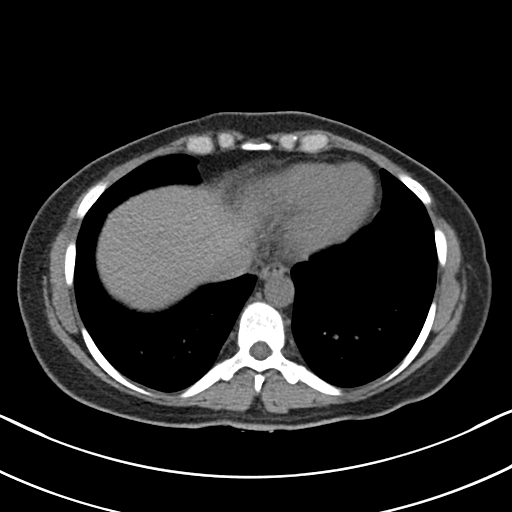

[Series 5: coronal soft tissue · coronal · 0.64mm/px · 3 of 62 slices shown]
[im 21/62  soft-tissue]
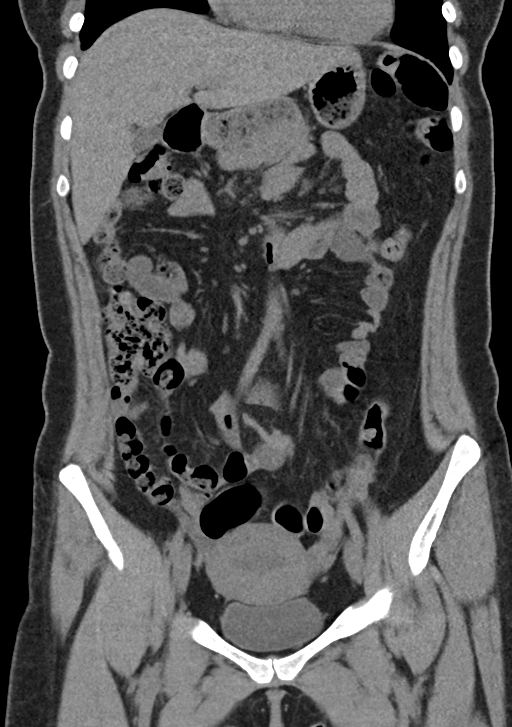
[im 28/62  soft-tissue]
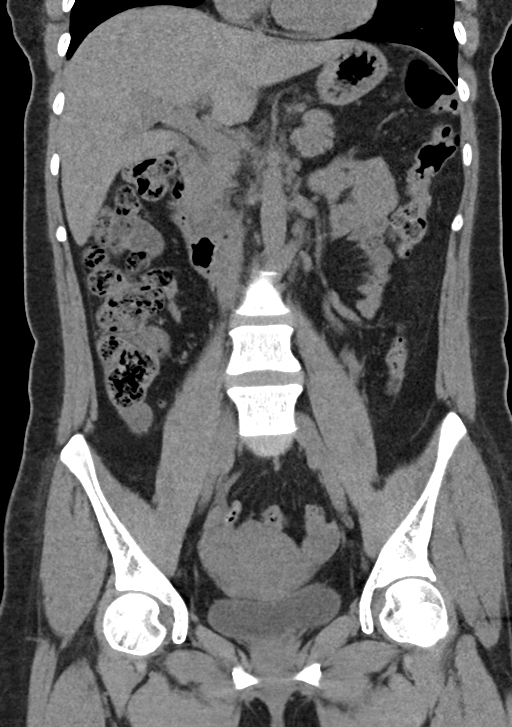
[im 34/62  soft-tissue]
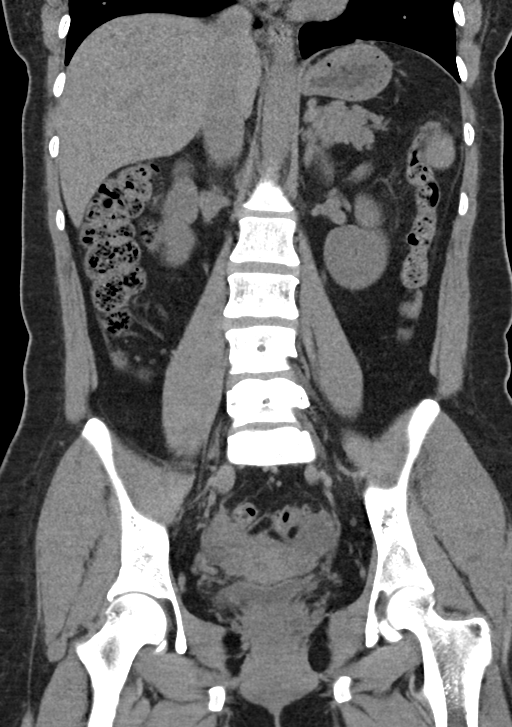

[16 of 46 positions shown; findings below may reference images not displayed]

FINDINGS: Lower Chest: No acute findings.

Hepatobiliary: No hepatic masses identified. Gallbladder is
unremarkable. No evidence of biliary ductal dilatation.

Pancreas:  No mass or inflammatory changes.

Spleen: Within normal limits in size and appearance.

Adrenals/Urinary Tract: A few tiny 1-2 mm renal calculi are seen
bilaterally. No evidence of ureteral calculi hydronephrosis.
Unremarkable unopacified urinary bladder.

Stomach/Bowel: No evidence bowel obstruction, inflammatory process,
abnormal fluid collections. Normal appendix visualized.

Vascular/Lymphatic: No pathologically enlarged lymph nodes. No
abdominal aortic aneurysm.

Reproductive:  No mass or other significant abnormality.

Other:  None.

Musculoskeletal:  No suspicious bone lesions identified.
IMPRESSION: Tiny bilateral renal calculi. No evidence of ureteral calculi,
hydronephrosis, or other acute findings.

## 2021-08-19 ENCOUNTER — Ambulatory Visit (HOSPITAL_BASED_OUTPATIENT_CLINIC_OR_DEPARTMENT_OTHER)
Admission: RE | Admit: 2021-08-19 | Discharge: 2021-08-19 | Disposition: A | Discharge status: Home / Self Care | Payer: 59 | Attending: Orthopaedic Surgery | Admitting: Orthopaedic Surgery

## 2021-08-19 ENCOUNTER — Encounter (HOSPITAL_BASED_OUTPATIENT_CLINIC_OR_DEPARTMENT_OTHER)
Admission: RE | Disposition: A | Discharge status: Home / Self Care | Payer: Self-pay | Source: Home / Self Care | Attending: Orthopaedic Surgery

## 2021-08-19 ENCOUNTER — Ambulatory Visit (HOSPITAL_BASED_OUTPATIENT_CLINIC_OR_DEPARTMENT_OTHER): Payer: 59 | Admitting: Anesthesiology

## 2021-08-19 ENCOUNTER — Other Ambulatory Visit: Payer: Self-pay

## 2021-08-19 ENCOUNTER — Encounter (HOSPITAL_BASED_OUTPATIENT_CLINIC_OR_DEPARTMENT_OTHER): Payer: Self-pay | Admitting: Orthopaedic Surgery

## 2021-08-19 DIAGNOSIS — F419 Anxiety disorder, unspecified: Secondary | ICD-10-CM | POA: Insufficient documentation

## 2021-08-19 DIAGNOSIS — M25372 Other instability, left ankle: Secondary | ICD-10-CM | POA: Insufficient documentation

## 2021-08-19 DIAGNOSIS — S9302XA Subluxation of left ankle joint, initial encounter: Secondary | ICD-10-CM | POA: Diagnosis not present

## 2021-08-19 DIAGNOSIS — M25872 Other specified joint disorders, left ankle and foot: Secondary | ICD-10-CM | POA: Insufficient documentation

## 2021-08-19 DIAGNOSIS — M19072 Primary osteoarthritis, left ankle and foot: Secondary | ICD-10-CM | POA: Diagnosis not present

## 2021-08-19 DIAGNOSIS — M899 Disorder of bone, unspecified: Secondary | ICD-10-CM | POA: Diagnosis not present

## 2021-08-19 DIAGNOSIS — M25772 Osteophyte, left ankle: Secondary | ICD-10-CM | POA: Insufficient documentation

## 2021-08-19 DIAGNOSIS — X58XXXA Exposure to other specified factors, initial encounter: Secondary | ICD-10-CM | POA: Insufficient documentation

## 2021-08-19 HISTORY — DX: Headache, unspecified: R51.9

## 2021-08-19 HISTORY — DX: Anxiety disorder, unspecified: F41.9

## 2021-08-19 HISTORY — PX: ANKLE ARTHROSCOPY WITH RECONSTRUCTION: SHX5583

## 2021-08-19 LAB — POCT PREGNANCY, URINE: Preg Test, Ur: NEGATIVE

## 2021-08-19 SURGERY — ARTHROSCOPY, ANKLE, WITH RECONSTRUCTION
Anesthesia: General | Site: Ankle | Laterality: Left

## 2021-08-19 MED ORDER — DEXAMETHASONE SODIUM PHOSPHATE 10 MG/ML IJ SOLN
INTRAMUSCULAR | Status: AC
  Filled 2021-08-19: qty 1

## 2021-08-19 MED ORDER — MIDAZOLAM HCL 2 MG/2ML IJ SOLN
INTRAMUSCULAR | Status: AC
  Filled 2021-08-19: qty 2

## 2021-08-19 MED ORDER — FENTANYL CITRATE (PF) 100 MCG/2ML IJ SOLN
50.0000 ug | Freq: Once | INTRAMUSCULAR | Status: AC
  Administered 2021-08-19: 07:00:00 50 ug via INTRAVENOUS

## 2021-08-19 MED ORDER — OXYCODONE HCL 5 MG PO TABS: 5.0000 mg | ORAL_TABLET | ORAL | 0 refills | Status: AC | PRN

## 2021-08-19 MED ORDER — LIDOCAINE HCL (CARDIAC) PF 100 MG/5ML IV SOSY
PREFILLED_SYRINGE | INTRAVENOUS | Status: DC | PRN
  Administered 2021-08-19: 100 mg via INTRAVENOUS

## 2021-08-19 MED ORDER — FENTANYL CITRATE (PF) 100 MCG/2ML IJ SOLN
INTRAMUSCULAR | Status: AC
  Filled 2021-08-19: qty 2

## 2021-08-19 MED ORDER — ATROPINE SULFATE 0.4 MG/ML IV SOLN
INTRAVENOUS | Status: AC
  Filled 2021-08-19: qty 1

## 2021-08-19 MED ORDER — OXYCODONE HCL 5 MG PO TABS: 5.0000 mg | ORAL_TABLET | Freq: Once | ORAL | Status: DC | PRN

## 2021-08-19 MED ORDER — PROPOFOL 10 MG/ML IV BOLUS
INTRAVENOUS | Status: AC
  Filled 2021-08-19: qty 20

## 2021-08-19 MED ORDER — MIDAZOLAM HCL 2 MG/2ML IJ SOLN
2.0000 mg | Freq: Once | INTRAMUSCULAR | Status: AC
  Administered 2021-08-19: 07:00:00 2 mg via INTRAVENOUS

## 2021-08-19 MED ORDER — BUPIVACAINE HCL (PF) 0.5 % IJ SOLN
INTRAMUSCULAR | Status: AC
  Filled 2021-08-19: qty 30

## 2021-08-19 MED ORDER — ONDANSETRON HCL 4 MG/2ML IJ SOLN
INTRAMUSCULAR | Status: DC | PRN
  Administered 2021-08-19: 4 mg via INTRAVENOUS

## 2021-08-19 MED ORDER — ONDANSETRON HCL 4 MG/2ML IJ SOLN
INTRAMUSCULAR | Status: AC
  Filled 2021-08-19: qty 2

## 2021-08-19 MED ORDER — KETOROLAC TROMETHAMINE 30 MG/ML IJ SOLN: 30.0000 mg | Freq: Once | INTRAMUSCULAR | Status: DC | PRN

## 2021-08-19 MED ORDER — PROMETHAZINE HCL 25 MG/ML IJ SOLN
6.2500 mg | INTRAMUSCULAR | Status: AC | PRN
  Administered 2021-08-19 (×2): 6.25 mg via INTRAVENOUS

## 2021-08-19 MED ORDER — CEFAZOLIN SODIUM-DEXTROSE 2-4 GM/100ML-% IV SOLN
2.0000 g | INTRAVENOUS | Status: AC
  Administered 2021-08-19: 08:00:00 2 g via INTRAVENOUS

## 2021-08-19 MED ORDER — PROMETHAZINE HCL 25 MG/ML IJ SOLN
INTRAMUSCULAR | Status: AC
  Filled 2021-08-19: qty 1

## 2021-08-19 MED ORDER — CEFAZOLIN SODIUM-DEXTROSE 2-4 GM/100ML-% IV SOLN
INTRAVENOUS | Status: AC
  Filled 2021-08-19: qty 100

## 2021-08-19 MED ORDER — LIDOCAINE-EPINEPHRINE 2 %-1:100000 IJ SOLN
INTRAMUSCULAR | Status: DC | PRN
  Administered 2021-08-19: 10 mL via PERINEURAL

## 2021-08-19 MED ORDER — OXYCODONE HCL 5 MG/5ML PO SOLN: 5.0000 mg | Freq: Once | ORAL | Status: DC | PRN

## 2021-08-19 MED ORDER — HYDROMORPHONE HCL 1 MG/ML IJ SOLN: 0.2500 mg | INTRAMUSCULAR | Status: DC | PRN

## 2021-08-19 MED ORDER — LACTATED RINGERS IV SOLN: INTRAVENOUS | Status: DC

## 2021-08-19 MED ORDER — LIDOCAINE 2% (20 MG/ML) 5 ML SYRINGE
INTRAMUSCULAR | Status: AC
  Filled 2021-08-19: qty 5

## 2021-08-19 MED ORDER — ASPIRIN 325 MG PO TABS: 325.0000 mg | ORAL_TABLET | Freq: Every day | ORAL | 0 refills | Status: AC

## 2021-08-19 MED ORDER — ROPIVACAINE HCL 5 MG/ML IJ SOLN
INTRAMUSCULAR | Status: DC | PRN
  Administered 2021-08-19: 30 mL via PERINEURAL

## 2021-08-19 MED ORDER — PROPOFOL 10 MG/ML IV BOLUS
INTRAVENOUS | Status: DC | PRN
  Administered 2021-08-19: 200 mg via INTRAVENOUS

## 2021-08-19 MED ORDER — DEXAMETHASONE SODIUM PHOSPHATE 10 MG/ML IJ SOLN
INTRAMUSCULAR | Status: DC | PRN
  Administered 2021-08-19: 10 mg via INTRAVENOUS

## 2021-08-19 SURGICAL SUPPLY — 74 items
ANCH SUT 3 SUTTAPE 2 LD (Anchor) ×1 IMPLANT
APL PRP STRL LF DISP 70% ISPRP (MISCELLANEOUS) ×1
APL SKNCLS STERI-STRIP NONHPOA (GAUZE/BANDAGES/DRESSINGS)
BANDAGE ESMARK 6X9 LF (GAUZE/BANDAGES/DRESSINGS) IMPLANT
BENZOIN TINCTURE PRP APPL 2/3 (GAUZE/BANDAGES/DRESSINGS) IMPLANT
BLADE SURG 15 STRL LF DISP TIS (BLADE) ×1 IMPLANT
BLADE SURG 15 STRL SS (BLADE) ×3
BNDG CMPR 9X4 STRL LF SNTH (GAUZE/BANDAGES/DRESSINGS)
BNDG CMPR 9X6 STRL LF SNTH (GAUZE/BANDAGES/DRESSINGS)
BNDG ELASTIC 6X5.8 VLCR STR LF (GAUZE/BANDAGES/DRESSINGS) ×6 IMPLANT
BNDG ESMARK 4X9 LF (GAUZE/BANDAGES/DRESSINGS) IMPLANT
BNDG ESMARK 6X9 LF (GAUZE/BANDAGES/DRESSINGS)
CHLORAPREP W/TINT 26 (MISCELLANEOUS) ×3 IMPLANT
CLOSURE WOUND 1/2 X4 (GAUZE/BANDAGES/DRESSINGS)
CUFF TOURN SGL QUICK 34 (TOURNIQUET CUFF) ×3
CUFF TRNQT CYL 34X4.125X (TOURNIQUET CUFF) ×1 IMPLANT
DECANTER SPIKE VIAL GLASS SM (MISCELLANEOUS) IMPLANT
DRAPE EXTREMITY T 121X128X90 (DISPOSABLE) ×3 IMPLANT
DRAPE OEC MINIVIEW 54X84 (DRAPES) IMPLANT
DRAPE U-SHAPE 47X51 STRL (DRAPES) ×3 IMPLANT
DRSG PAD ABDOMINAL 8X10 ST (GAUZE/BANDAGES/DRESSINGS) ×3 IMPLANT
ELECT REM PT RETURN 9FT ADLT (ELECTROSURGICAL) ×3
ELECTRODE REM PT RTRN 9FT ADLT (ELECTROSURGICAL) IMPLANT
EXCALIBUR 3.8MM X 13CM (MISCELLANEOUS) ×3 IMPLANT
GAUZE SPONGE 4X4 12PLY STRL (GAUZE/BANDAGES/DRESSINGS) ×3 IMPLANT
GAUZE XEROFORM 1X8 LF (GAUZE/BANDAGES/DRESSINGS) ×3 IMPLANT
GLOVE SRG 8 PF TXTR STRL LF DI (GLOVE) ×1 IMPLANT
GLOVE SURG ENC TEXT LTX SZ7.5 (GLOVE) ×5 IMPLANT
GLOVE SURG UNDER POLY LF SZ8 (GLOVE) ×6
GOWN STRL REUS W/ TWL LRG LVL3 (GOWN DISPOSABLE) ×1 IMPLANT
GOWN STRL REUS W/ TWL XL LVL3 (GOWN DISPOSABLE) ×1 IMPLANT
GOWN STRL REUS W/TWL LRG LVL3 (GOWN DISPOSABLE) ×3
GOWN STRL REUS W/TWL XL LVL3 (GOWN DISPOSABLE) ×6
KIT SUTURETAK 2.4 DRILL BIT (KITS) ×2 IMPLANT
MANIFOLD NEPTUNE II (INSTRUMENTS) ×3 IMPLANT
NDL KEITH (NEEDLE) IMPLANT
NDL SUT 6 .5 CRC .975X.05 MAYO (NEEDLE) IMPLANT
NEEDLE KEITH (NEEDLE) IMPLANT
NEEDLE MAYO TAPER (NEEDLE)
NS IRRIG 1000ML POUR BTL (IV SOLUTION) ×3 IMPLANT
PACK ARTHROSCOPY DSU (CUSTOM PROCEDURE TRAY) ×3 IMPLANT
PACK BASIN DAY SURGERY FS (CUSTOM PROCEDURE TRAY) ×3 IMPLANT
PADDING CAST SYNTHETIC 4 (CAST SUPPLIES) ×4
PADDING CAST SYNTHETIC 4X4 STR (CAST SUPPLIES) ×2 IMPLANT
PENCIL SMOKE EVACUATOR (MISCELLANEOUS) IMPLANT
SHAVER DISSECTOR 3.0 (BURR) IMPLANT
SHAVER SABRE 2.0 (BURR) IMPLANT
SHEET MEDIUM DRAPE 40X70 STRL (DRAPES) ×3 IMPLANT
SLEEVE SCD COMPRESS KNEE MED (STOCKING) ×3 IMPLANT
SPLINT FAST PLASTER 5X30 (CAST SUPPLIES) ×40
SPLINT PLASTER CAST FAST 5X30 (CAST SUPPLIES) IMPLANT
SPONGE T-LAP 18X18 ~~LOC~~+RFID (SPONGE) ×2 IMPLANT
STOCKINETTE 6  STRL (DRAPES) ×3
STOCKINETTE 6 STRL (DRAPES) ×1 IMPLANT
STRAP ANKLE FOOT DISTRACTOR (ORTHOPEDIC SUPPLIES) ×3 IMPLANT
STRIP CLOSURE SKIN 1/2X4 (GAUZE/BANDAGES/DRESSINGS) IMPLANT
SUCTION FRAZIER HANDLE 10FR (MISCELLANEOUS) ×3
SUCTION TUBE FRAZIER 10FR DISP (MISCELLANEOUS) ×1 IMPLANT
SUT BONE WAX W31G (SUTURE) IMPLANT
SUT ETHILON 3 0 PS 1 (SUTURE) ×3 IMPLANT
SUT FIBERWIRE #2 38 T-5 BLUE (SUTURE)
SUT FIBERWIRE 2-0 18 17.9 3/8 (SUTURE) ×3
SUT MNCRL AB 3-0 PS2 18 (SUTURE) ×3 IMPLANT
SUT PDS AB 2-0 CT2 27 (SUTURE) ×3 IMPLANT
SUT VIC AB 3-0 FS2 27 (SUTURE) IMPLANT
SUT VICRYL 0 SH 27 (SUTURE) ×2 IMPLANT
SUTURE FIBERWR #2 38 T-5 BLUE (SUTURE) IMPLANT
SUTURE FIBERWR 2-0 18 17.9 3/8 (SUTURE) IMPLANT
SUTURE TAPE 3.0 DBL LOAD S-TAK (Anchor) IMPLANT
SUTURETAPE 3.0 DBL LOAD S-TAK (Anchor) ×3 IMPLANT
SYR BULB EAR ULCER 3OZ GRN STR (SYRINGE) ×2 IMPLANT
TOWEL GREEN STERILE FF (TOWEL DISPOSABLE) ×6 IMPLANT
TUBING ARTHROSCOPY IRRIG 16FT (MISCELLANEOUS) ×3 IMPLANT
UNDERPAD 30X36 HEAVY ABSORB (UNDERPADS AND DIAPERS) ×3 IMPLANT

## 2021-08-19 NOTE — H&P (Signed)
PREOPERATIVE H&P  Chief Complaint: Left ankle pain  HPI: Caitlin Bryant is a 40 y.o. female who presents for preoperative history and physical with a diagnosis of left ankle synovitis, impingement with osteochondral lesion and instability.  Also some tendinosis versus tearing of the peroneal tendons.  She is here today for surgery.  She has failed conservative treatment.. Symptoms are rated as moderate to severe, and have been worsening.  This is significantly impairing activities of daily living.  She has elected for surgical management.   Past Medical History:  Diagnosis Date   Anxiety    Headache    MRSA (methicillin resistant staph aureus) culture positive    5 or more years ago, right thumb   Past Surgical History:  Procedure Laterality Date   CESAREAN SECTION  2004 and 2008   x 2   DIAGNOSTIC LAPAROSCOPY     TONSILLECTOMY     Social History   Socioeconomic History   Marital status: Married    Spouse name: Not on file   Number of children: Not on file   Years of education: Not on file   Highest education level: Not on file  Occupational History   Not on file  Tobacco Use   Smoking status: Never   Smokeless tobacco: Never  Vaping Use   Vaping Use: Never used  Substance and Sexual Activity   Alcohol use: Yes    Comment: 1 a month or less   Drug use: No   Sexual activity: Yes    Partners: Male  Other Topics Concern   Not on file  Social History Narrative   Not on file   Social Determinants of Health   Financial Resource Strain: Not on file  Food Insecurity: Not on file  Transportation Needs: Not on file  Physical Activity: Not on file  Stress: Not on file  Social Connections: Not on file   Family History  Problem Relation Age of Onset   Hypertension Mother    Diabetes Mother    Osteoarthritis Mother    Fibromyalgia Mother    Heart attack Father        Defibrillator   Allergies  Allergen Reactions   Sumatriptan Nausea And Vomiting    Severe  nausea and vomiting   Sulfa Antibiotics Rash   Prior to Admission medications   Medication Sig Start Date End Date Taking? Authorizing Provider  cetirizine (ZYRTEC) 10 MG tablet Take 10 mg by mouth daily.   Yes [provider]  fluticasone (FLONASE) 50 MCG/ACT nasal spray Place into both nostrils daily.   Yes [provider]  rizatriptan (MAXALT) 10 MG tablet Take 1 tablet (10 mg total) by mouth as needed for migraine. May repeat in 2 hours if needed 07/07/21  Yes Boscia, Heather E, NP  sertraline (ZOLOFT) 25 MG tablet Take 2 tablets (50 mg total) by mouth daily. Patient taking differently: Take 25 mg by mouth daily. 06/16/21  Yes Carlean Jews, NP  methylPREDNISolone (MEDROL) 4 MG TBPK tablet Take by mouth as directed for 6 days 06/12/21   Carlean Jews, NP  ondansetron (ZOFRAN ODT) 4 MG disintegrating tablet Take 1 tablet (4 mg total) by mouth every 8 (eight) hours as needed for nausea or vomiting. 05/15/20   Wieters, Hallie C, PA-C     Positive ROS: All other systems have been reviewed and were otherwise negative with the exception of those mentioned in the HPI and as above.  Physical Exam:  Vitals:   08/19/21  0634  BP: 134/84  Pulse: 79  Resp: 17  Temp: 98.1 F (36.7 C)  SpO2: 100%   General: Alert, no acute distress Cardiovascular: No pedal edema Respiratory: No cyanosis, no use of accessory musculature GI: No organomegaly, abdomen is soft and non-tender Skin: No lesions in the area of chief complaint Neurologic: Sensation intact distally Psychiatric: Patient is competent for consent with normal mood and affect Lymphatic: No axillary or cervical lymphadenopathy  MUSCULOSKELETAL: Left ankle with instability to anterior drawer.  Tender to palpation along the anterior aspect of the ankle.  Good hindfoot eversion strength but pain to palpation along the peroneal tendons.  Some crepitance with the peroneal tendon testing.  In the forefoot there is a small  seed callus.  Medial foot there is a lipomatous type soft tissue mass without any overlying skin changes.  Sensation grossly intact.  Foot is warm and well-perfused.  Assessment: Left ankle impingement with osteochondral lesion of the talus.  Ankle instability with peroneal tendinosis.  Forefoot seed callus.   Plan: Plan for ankle arthroscopy with debridement and treatment of osteochondral lesion.  Lateral ligament reconstruction and peroneal debridement versus repair.  We discussed the risks, benefits and alternatives of surgery which include but are not limited to wound healing complications, infection, nonunion, malunion, need for further surgery, damage to surrounding structures and continued pain.  They understand there is no guarantees to an acceptable outcome.  After weighing these risks they opted to proceed with surgery.     Terance Hart, MD    08/19/2021 7:19 AM

## 2021-08-19 NOTE — Progress Notes (Signed)
Assisted Dr. Rose with left, ultrasound guided, popliteal/saphenous block. Side rails up, monitors on throughout procedure. See vital signs in flow sheet. Tolerated Procedure well. °

## 2021-08-19 NOTE — Anesthesia Postprocedure Evaluation (Signed)
Anesthesia Post Note  Patient: Caitlin Bryant  Procedure(s) Performed: LEFT ANKLE ARTHROSCOPY, ARTHROSCOPIC TREATMENT OF TALUS OSTEOCHONDRAL LESION, LATERAL LIGAMENT RECONSTRUCTION, PERONEAL TENDON DEBRIDEMENT, REPAIR OF DISLOCATING PERONEAL TENDONS (Left: Ankle)     Patient location during evaluation: PACU Anesthesia Type: General Level of consciousness: awake and alert Pain management: pain level controlled Vital Signs Assessment: post-procedure vital signs reviewed and stable Respiratory status: spontaneous breathing, nonlabored ventilation, respiratory function stable and patient connected to nasal cannula oxygen Cardiovascular status: blood pressure returned to baseline and stable Postop Assessment: no apparent nausea or vomiting Anesthetic complications: no   No notable events documented.  Last Vitals:  Vitals:   08/19/21 0930 08/19/21 0941  BP: 110/73 130/78  Pulse: 86 81  Resp: 13 14  Temp:  36.4 C  SpO2: 95% 96%    Last Pain:  Vitals:   08/19/21 0930  TempSrc:   PainSc: Asleep                 Jakiya Bookbinder S

## 2021-08-19 NOTE — Anesthesia Procedure Notes (Signed)
Anesthesia Procedure Image    

## 2021-08-19 NOTE — Anesthesia Preprocedure Evaluation (Signed)
Anesthesia Evaluation  Patient identified by MRN, date of birth, ID band Patient awake    Reviewed: Allergy & Precautions, NPO status , Patient's Chart, lab work & pertinent test results  Airway Mallampati: II  TM Distance: >3 FB Neck ROM: Full    Dental no notable dental hx.    Pulmonary neg pulmonary ROS,    Pulmonary exam normal breath sounds clear to auscultation       Cardiovascular negative cardio ROS Normal cardiovascular exam Rhythm:Regular Rate:Normal     Neuro/Psych Anxiety negative neurological ROS     GI/Hepatic negative GI ROS, Neg liver ROS,   Endo/Other  negative endocrine ROS  Renal/GU negative Renal ROS  negative genitourinary   Musculoskeletal negative musculoskeletal ROS (+)   Abdominal   Peds negative pediatric ROS (+)  Hematology negative hematology ROS (+)   Anesthesia Other Findings   Reproductive/Obstetrics negative OB ROS                             Anesthesia Physical Anesthesia Plan  ASA: 2  Anesthesia Plan: General   Post-op Pain Management: Regional block   Induction: Intravenous  PONV Risk Score and Plan: 3 and Ondansetron, Dexamethasone, Midazolam and Treatment may vary due to age or medical condition  Airway Management Planned: LMA  Additional Equipment:   Intra-op Plan:   Post-operative Plan: Extubation in OR  Informed Consent: I have reviewed the patients History and Physical, chart, labs and discussed the procedure including the risks, benefits and alternatives for the proposed anesthesia with the patient or authorized representative who has indicated his/her understanding and acceptance.     Dental advisory given  Plan Discussed with: CRNA and Surgeon  Anesthesia Plan Comments:         Anesthesia Quick Evaluation

## 2021-08-19 NOTE — Anesthesia Procedure Notes (Signed)
Procedure Name: LMA Insertion Date/Time: 08/19/2021 7:31 AM Performed by: Lauralyn Primes, CRNA Pre-anesthesia Checklist: Patient identified, Emergency Drugs available, Suction available and Patient being monitored Patient Re-evaluated:Patient Re-evaluated prior to induction Oxygen Delivery Method: Circle system utilized Preoxygenation: Pre-oxygenation with 100% oxygen Induction Type: IV induction Ventilation: Mask ventilation without difficulty LMA: LMA inserted LMA Size: 4.0 Number of attempts: 1 Airway Equipment and Method: Bite block Placement Confirmation: positive ETCO2 Tube secured with: Tape Dental Injury: Teeth and Oropharynx as per pre-operative assessment

## 2021-08-19 NOTE — Anesthesia Procedure Notes (Signed)
Anesthesia Regional Block: Popliteal block   Pre-Anesthetic Checklist: , timeout performed,  Correct Patient, Correct Site, Correct Laterality,  Correct Procedure, Correct Position, site marked,  Risks and benefits discussed,  Surgical consent,  Pre-op evaluation,  At surgeon's request and post-op pain management  Laterality: Left  Prep: chloraprep       Needles:  Injection technique: Single-shot  Needle Type: Echogenic Needle     Needle Length: 10cm      Additional Needles:   Procedures:,,,, ultrasound used (permanent image in chart),,    Narrative:  Start time: 08/19/2021 6:57 AM End time: 08/19/2021 7:04 AM Injection made incrementally with aspirations every 5 mL.  Performed by: Personally  Anesthesiologist: Eilene Ghazi, MD  Additional Notes: Patient tolerated the procedure well without complications

## 2021-08-19 NOTE — Discharge Instructions (Addendum)
[facility] POST-OP INSTRUCTIONS   Pain Management The numbing medicine and your leg will last around 18 hours, take a dose of your pain medicine as soon as you feel it wearing off to avoid rebound pain. Keep your foot elevated above heart level.  Make sure that your heel hangs free ('floats'). Take all prescribed medication as directed. If taking narcotic pain medication you may want to use an over-the-counter stool softener to avoid constipation. You may take over-the-counter NSAIDs (ibuprofen, naproxen, etc.) as well as over-the-counter acetaminophen as directed on the packaging as a supplement for your pain and may also use it to wean away from the prescription medication.  Activity Non-weightbearing Keep splint intact  First Postoperative Visit Your first postop visit will be at least 2 weeks after surgery.  This should be scheduled when you schedule surgery. If you do not have a postoperative visit scheduled please call [phone] to schedule an appointment. At the appointment your incision will be evaluated for suture removal, x-rays will be obtained if necessary.  General Instructions Swelling is very common after foot and ankle surgery.  It often takes 3 months for the foot and ankle to begin to feel comfortable.  Some amount of swelling will persist for 6-12 months. DO NOT change the dressing.  If there is a problem with the dressing (too tight, loose, gets wet, etc.) please contact Dr. Adair's office. DO NOT get the dressing wet.  For showers you can use an over-the-counter cast cover or wrap a washcloth around the top of your dressing and then cover it with a plastic bag and tape it to your leg. DO NOT soak the incision (no tubs, pools, bath, etc.) until you have approval from Dr. Adair.  Contact Dr. Adairs office or go to Emergency Room if: Temperature above 101 F. Increasing pain that is unresponsive to pain medication or elevation Excessive redness or  swelling in your foot Dressing problems - excessive bloody drainage, looseness or tightness, or if dressing gets wet Develop pain, swelling, warmth, or discoloration of your calf   Post Anesthesia Home Care Instructions  Activity: Get plenty of rest for the remainder of the day. A responsible individual must stay with you for 24 hours following the procedure.  For the next 24 hours, DO NOT: -Drive a car -Operate machinery -Drink alcoholic beverages -Take any medication unless instructed by your physician -Make any legal decisions or sign important papers.  Meals: Start with liquid foods such as gelatin or soup. Progress to regular foods as tolerated. Avoid greasy, spicy, heavy foods. If nausea and/or vomiting occur, drink only clear liquids until the nausea and/or vomiting subsides. Call your physician if vomiting continues.  Special Instructions/Symptoms: Your throat may feel dry or sore from the anesthesia or the breathing tube placed in your throat during surgery. If this causes discomfort, gargle with warm salt water. The discomfort should disappear within 24 hours.  If you had a scopolamine patch placed behind your ear for the management of post- operative nausea and/or vomiting:  1. The medication in the patch is effective for 72 hours, after which it should be removed.  Wrap patch in a tissue and discard in the trash. Wash hands thoroughly with soap and water. 2. You may remove the patch earlier than 72 hours if you experience unpleasant side effects which may include dry mouth, dizziness or visual disturbances. 3. Avoid touching the patch. Wash your hands with soap and water after contact with the   patch.  Regional Anesthesia Blocks  1. Numbness or the inability to move the "blocked" extremity may last from 3-48 hours after placement. The length of time depends on the medication injected and your individual response to the medication. If the numbness is not going away after 48  hours, call your surgeon.  2. The extremity that is blocked will need to be protected until the numbness is gone and the  Strength has returned. Because you cannot feel it, you will need to take extra care to avoid injury. Because it may be weak, you may have difficulty moving it or using it. You may not know what position it is in without looking at it while the block is in effect.  3. For blocks in the legs and feet, returning to weight bearing and walking needs to be done carefully. You will need to wait until the numbness is entirely gone and the strength has returned. You should be able to move your leg and foot normally before you try and bear weight or walk. You will need someone to be with you when you first try to ensure you do not fall and possibly risk injury.  4. Bruising and tenderness at the needle site are common side effects and will resolve in a few days.  5. Persistent numbness or new problems with movement should be communicated to the surgeon or the [facility] ([phone])/ [facility] ([phone]).      

## 2021-08-19 NOTE — Transfer of Care (Signed)
Immediate Anesthesia Transfer of Care Note  Patient: Caitlin Bryant  Procedure(s) Performed: LEFT ANKLE ARTHROSCOPY, ARTHROSCOPIC TREATMENT OF TALUS OSTEOCHONDRAL LESION, LATERAL LIGAMENT RECONSTRUCTION, PERONEAL TENDON DEBRIDEMENT, REPAIR OF DISLOCATING PERONEAL TENDONS (Left: Ankle)  Patient Location: PACU  Anesthesia Type:GA combined with regional for post-op pain  Level of Consciousness: drowsy  Airway & Oxygen Therapy: Patient Spontanous Breathing and Patient connected to face mask oxygen  Post-op Assessment: Report given to RN and Post -op Vital signs reviewed and stable  Post vital signs: Reviewed and stable  Last Vitals:  Vitals Value Taken Time  BP    Temp    Pulse 105 08/19/21 0857  Resp 19 08/19/21 0857  SpO2 100 % 08/19/21 0857  Vitals shown include unvalidated device data.  Last Pain:  Vitals:   08/19/21 0720  TempSrc:   PainSc: 0-No pain      Patients Stated Pain Goal: 3 (08/19/21 4401)  Complications: No notable events documented.

## 2021-08-19 NOTE — Anesthesia Procedure Notes (Signed)
Anesthesia Regional Block: Adductor canal block (saphenous nerve block)   Pre-Anesthetic Checklist: , timeout performed,  Correct Patient, Correct Site, Correct Laterality,  Correct Procedure, Correct Position, site marked,  Risks and benefits discussed,  Surgical consent,  Pre-op evaluation,  At surgeon's request and post-op pain management  Laterality: Left  Prep: chloraprep       Needles:  Injection technique: Single-shot  Needle Type: Echogenic Needle     Needle Length: 5cm  Needle Gauge: 22     Additional Needles:   Procedures:,,,, ultrasound used (permanent image in chart),,    Narrative:  Start time: 08/19/2021 7:05 AM End time: 08/19/2021 7:09 AM Injection made incrementally with aspirations every 5 mL.  Performed by: Personally  Anesthesiologist: Eilene Ghazi, MD  Additional Notes: Patient tolerated the procedure well without complications

## 2021-08-19 NOTE — Op Note (Signed)
ISOBELLE TUCKETT female 40 y.o. 08/19/2021  PreOperative Diagnosis: Left ankle arthritis Left ankle impingement Left ankle talar dome osteochondral lesion Left ankle instability   PostOperative Diagnosis: Left ankle arthritis Left ankle impingement Left ankle talar dome osteochondral lesion Left peroneal tendon adhesions Subluxating peroneal tendons Distal fibular exostoses Left ankle instability  PROCEDURE: Left ankle arthroscopic debridement, extensive Left ankle arthroscopic treatment of osteochondral lesion talus Excisional debridement of peroneal tendons/tenolysis Repair of subluxating peroneal tendons Partial resection of distal fibula Lateral ligament reconstruction  SURGEON: Dub Mikes, MD  ASSISTANT: Jesse [place], PA-C: His assistance was necessary for prep and drape, exposure, holding retractors, assistance with holding the scope and scope instruments, placement of implant, wound closure and splinting.   ANESTHESIA: General with peripheral nerve block  FINDINGS: Left ankle chondrosis with significant mount of anterolateral and medial scar tissue consistent with ankle impingement Lateral talar dome osteochondral lesion into the lateral gutter with full-thickness cartilage and bony loss Left ankle lateral ligament instability  IMPLANTS: Arthrex suture tack  INDICATIONS: Akaila is a 40 year old female who sustained an ankle injury and had continued pain despite conservative treatment the form of boot immobilization, anti-inflammatories, ankle injections, physical therapy.  MRI demonstrated osteochondral lesion of the medial talar dome and ligamentous insufficiency.  She also had pain with palpation of the peroneal tendons and pain against resisted hindfoot eversion.  Patient understood the risks, benefits and alternatives of surgery which include but not limited to wound healing complications, infection, nonunion, malunion, need for further surgery and  damage to surrounding structures.  Patient also understood the possibility of progressive arthritis and continued pain.  After weighing the risks she elected to proceed with surgery.  PROCEDURE: Patient was identified in the preoperative holding area.  The left leg was marked myself.  The consent was signed myself and the patient.  Peripheral nerve blockade was performed by anesthesia. Patient was taken the operative suite placed supine operative table.  Thigh tourniquet was placed on the operative thigh after general LMA anesthesia was induced without difficulty.  Preoperative antibiotics were given.  A bump was placed on the left hip and all bony prominences were well-padded.  The operative extremity was prepped and draped in the usual sterile fashion.  Surgical timeout was performed.  We began by elevating the limb and inflating the tourniquet to 250 mmHg.  The ankle distractor was placed.   Ankle rthoscopic debridement, extensive: We started by insufflating the ankle joint.  An 18-gauge needle was placed within the ankle joint medial to the tibialis anterior tendon.  Normal saline was used to insufflate the joint.  We began by making an anteromedial portal to the ankle joint.  This was done with an 11 blade through the skin.  Then blunt dissection was used with a hemostat down to the capsule.  Then the capsule was violated and the joint entered.  Then the trocar with the camera was placed.   Upon inspection of the joint there was found to be evidence of synovitis and some impingement type symptoms along the anterior lateral aspect of the lateral tibial plafond.  The talus was inspected and the osteochondral lesion was noted on the medial talar dome.  There was delaminated loose cartilage with fraying overlying the osteochondral lesion.  Then the probe was removed and the shaver was inserted.  Extensive debridement was done of the ankle joint including synovial tissue the loose bony fragments and osteophyte  on the anterior distal tibial follow-up plafond.    The loose  portions of the cartilage were debrided extensively with the shaver about the anterior portion of the distal tibia.  Bone was debrided.  Treatment of osteochondral lesion of talus: We then turned our attention to the osteochondral lesion of the medial talar dome.  Probe was used to identify the loose cartilage fragments.  All portions of the loose cartilage that were unable to be adequately debrided with a shaver were debrided with a curette used back to a stable cartilage base. There was a notable full-thickness osteochondral defect of the lateral talar dome and the loose and delaminated portions of cartilage and bone were removed with a curette.  Then using a curette the subchondral bony surfaces were roughened back to good healthy bleeding bone.  Then a microfracture pick was inserted and a microfracture was performed.  The shaver was reinserted to remove any loose bony or cartilage fragments.  There was evidence of bony bleeding at the site of the lesion. The joint surfaces were inspected for any evidence of loose cartilage.  The remaining joint surfaces were without evidence of cartilage wear.  This completed the arthroscopic portion of the case.  The ankle distractor was then removed.    Peroneal tendon debridement: We then turned our attention to the lateral ankle.  An incision was made overlying the distal fibula and peroneal tendons.  It was taken sharply down through skin and subcutaneous tissue.  We are able to identify the peroneal tendon sheath and a small nick was made proximal to the SPR.  Glorious Peach was inserted into the peroneal tendon sheath and the SPR was found to be incompetent.  The incision was carried through the SPR and the peroneal tendons were then inspected.  There was no obvious tearing of the peroneus brevis or peroneus longus tendon.  There is a low-lying muscle belly the peroneus brevis tendon and the tendons did have  adhesions.  Using dissection scissors and 15 blade the tendons were debrided.  The excised material was discarded.  The tendons were freed of any surrounding adhesions.    Partial excision/craterization of distal fibula: We then turned our attention to the lateral ligaments.  A l separate deep incision was created anteriorly.  The incision in an oblique fashion was made overlying the lateral ligament complex.  Subcuticular fat was mobilized anteriorly and posteriorly.  Then the extensor retinacular tissue was identified as well as as the distal fibula.  An incision was made through the remaining lateral ligament tissue at the tip of the fibula in a curvilinear fashion.  Then the tissue was inspected and found to be incompetent.  The distal aspect of the fibula was cleared back to a bleeding surface using a rondure and a knife.  There was exophytic component of the distal fibula and a rondure was used to partially remove the most distal and anterior portion of the distal fibula.  This was craterized back to bleeding bony surface to allow for good ligament healing.  Lateral ligament reconstruction: The ligaments were inspected and found to be appropriate for reconstruction.  We placed a Arthrex suture tack within the distal fibula.   Then the suture tacks and attached suture were passed through the remaining lateral ligaments incorporating the extensor retinaculum.   The ligaments were then recontsructed by tying down the suture anchors.  The ankle was held in approximately 30 degrees of plantarflexion with a posterior directed force and eversion through the hindfoot.  Then the ankle was assessed for stability and found to be stable.  Repair of dislocating peroneal tendons: We then turned our attention back to the superior peroneal retinaculum.  The retinaculum was elevated from the lateral aspect of the fibula.  This area was roughened up using a rondure back to bleeding bone.  Then the retinacular tissue  was reconstructed back to the posterior fibula through bone tunnels using a 2-0 FiberWire suture.  The tendons were able to glide freely through the retinaculum after reconstruction.  The wound was irrigated copiously with normal saline.  The tourniquet was released and hemostasis was obtained.    The wounds were closed in a layered fashion using  3-0 Monocryl and the skin with a 3-0 nylon.  The portals were closed with a 3-0 nylon.   The leg was cleaned and the wounds were covered with Xeroform and a soft dressing.   A nonweightbearing short leg splint was placed.  Patient was awakened from anesthesia and taken recovery in stable condition.  All counts were correct at the end the case.  There was no complications.   POST OPERATIVE INSTRUCTIONS: Keep splint dry and in place Nonweightbearing to right lower extremity Call the office with concerns Follow-up in 2 weeks for splint removal, suture removal and likely placement of a nonweightbearing cast. No need for DVT prophylaxis.  TOURNIQUET TIME: Less than 2 hours  BLOOD LOSS:  Minimal         DRAINS: none         SPECIMEN: none       COMPLICATIONS:  * No complications entered in OR log *         Disposition: PACU - hemodynamically stable.         Condition: stable

## 2021-08-20 ENCOUNTER — Encounter (HOSPITAL_BASED_OUTPATIENT_CLINIC_OR_DEPARTMENT_OTHER): Payer: Self-pay | Admitting: Orthopaedic Surgery

## 2021-08-26 ENCOUNTER — Other Ambulatory Visit: Payer: Self-pay | Admitting: Nurse Practitioner

## 2021-08-26 ENCOUNTER — Telehealth: Payer: Self-pay | Admitting: Nurse Practitioner

## 2021-08-26 DIAGNOSIS — U071 COVID-19: Secondary | ICD-10-CM

## 2021-08-26 DIAGNOSIS — J069 Acute upper respiratory infection, unspecified: Secondary | ICD-10-CM

## 2021-08-26 MED ORDER — NIRMATRELVIR/RITONAVIR (PAXLOVID)TABLET
3.0000 | ORAL_TABLET | Freq: Two times a day (BID) | ORAL | 0 refills | Status: AC
Start: 2021-08-26 — End: 2021-08-31

## 2021-08-26 NOTE — Telephone Encounter (Signed)
Please let her know I sent in the paxlovid for her. She should take 3 tablets twice daily for next 5 days. She should use caution when taking her pain medication from surgery as this may increase the narcotic effects.  I recommend rest and increased fluid. He should inculde vitamins C, D, and Zinc every day to boost immune system. Follow CDC recommendations for isolation and quarantine.

## 2021-08-26 NOTE — Telephone Encounter (Signed)
Called pt spoke with husband he is advised of his wife Rx and recommendation

## 2021-08-26 NOTE — Telephone Encounter (Signed)
Patient called and stated she had ankle surgery last week and yesterday morning patient tested + for covid. Requesting something be called in because she cannot get up and walk around. Please advise. [phone]

## 2021-09-12 DIAGNOSIS — M25572 Pain in left ankle and joints of left foot: Secondary | ICD-10-CM | POA: Diagnosis not present

## 2021-09-12 DIAGNOSIS — Z9889 Other specified postprocedural states: Secondary | ICD-10-CM | POA: Diagnosis not present

## 2021-09-18 ENCOUNTER — Ambulatory Visit (INDEPENDENT_AMBULATORY_CARE_PROVIDER_SITE_OTHER): Payer: 59 | Admitting: Nurse Practitioner

## 2021-09-18 ENCOUNTER — Encounter: Payer: Self-pay | Admitting: Nurse Practitioner

## 2021-09-18 ENCOUNTER — Other Ambulatory Visit: Payer: Self-pay

## 2021-09-18 VITALS — BP 123/86 | HR 104 | Temp 98.1°F | Ht 63.0 in | Wt 169.4 lb

## 2021-09-18 DIAGNOSIS — F411 Generalized anxiety disorder: Secondary | ICD-10-CM

## 2021-09-18 DIAGNOSIS — M79672 Pain in left foot: Secondary | ICD-10-CM

## 2021-09-18 DIAGNOSIS — R69 Illness, unspecified: Secondary | ICD-10-CM | POA: Diagnosis not present

## 2021-09-18 DIAGNOSIS — Z6830 Body mass index (BMI) 30.0-30.9, adult: Secondary | ICD-10-CM

## 2021-09-18 MED ORDER — SERTRALINE HCL 25 MG PO TABS: 25.0000 mg | ORAL_TABLET | Freq: Every day | ORAL | 1 refills | Status: DC

## 2021-09-18 NOTE — Progress Notes (Signed)
Established Patient Office Visit  Subjective:  Patient ID: Caitlin Bryant, female    DOB: [birth year]  Age: 41 y.o. MRN: [id number]  CC:  Chief Complaint  Patient presents with   Follow-up    HPI Caitlin Bryant presents for routine follow up.  -had surgery to repair tendons and cartilage  of left ankle on 06/19/2021. Currently on surgical boot. Healing well. Has some nerve pain in her left foot. Otherwise doing well.  -currently taking Zoloft 25 mg.  Managing anxiety and depression well. -Has no new concerns or complaints today.  Past Medical History:  Diagnosis Date   Anxiety    Headache    MRSA (methicillin resistant staph aureus) culture positive    5 or more years ago, right thumb    Past Surgical History:  Procedure Laterality Date   ANKLE ARTHROSCOPY WITH RECONSTRUCTION Left 08/19/2021   Procedure: LEFT ANKLE ARTHROSCOPY, ARTHROSCOPIC TREATMENT OF TALUS OSTEOCHONDRAL LESION, LATERAL LIGAMENT RECONSTRUCTION, PERONEAL TENDON DEBRIDEMENT, REPAIR OF DISLOCATING PERONEAL TENDONS;  Surgeon: Erle Crocker, MD;  Location: [facility];  Service: Orthopedics;  Laterality: Left;  LENGTH OF SURGERY: 120 MINUTES   CESAREAN SECTION  2004 and 2008   x 2   DIAGNOSTIC LAPAROSCOPY     TONSILLECTOMY      Family History  Problem Relation Age of Onset   Hypertension Mother    Diabetes Mother    Osteoarthritis Mother    Fibromyalgia Mother    Heart attack Father        Defibrillator    Social History   Socioeconomic History   Marital status: Married    Spouse name: Not on file   Number of children: Not on file   Years of education: Not on file   Highest education level: Not on file  Occupational History   Not on file  Tobacco Use   Smoking status: Never   Smokeless tobacco: Never  Vaping Use   Vaping Use: Never used  Substance and Sexual Activity   Alcohol use: Yes    Comment: 1 a month or less   Drug use: No   Sexual activity: Yes     Partners: Male  Other Topics Concern   Not on file  Social History Narrative   Not on file   Social Determinants of Health   Financial Resource Strain: Not on file  Food Insecurity: Not on file  Transportation Needs: Not on file  Physical Activity: Not on file  Stress: Not on file  Social Connections: Not on file  Intimate Partner Violence: Not on file    Outpatient Medications Prior to Visit  Medication Sig Dispense Refill   aspirin (BAYER ASPIRIN) 325 MG tablet Take 1 tablet (325 mg total) by mouth daily. 30 tablet 0   cetirizine (ZYRTEC) 10 MG tablet Take 10 mg by mouth daily.     fluticasone (FLONASE) 50 MCG/ACT nasal spray Place into both nostrils daily.     ondansetron (ZOFRAN ODT) 4 MG disintegrating tablet Take 1 tablet (4 mg total) by mouth every 8 (eight) hours as needed for nausea or vomiting. 20 tablet 0   rizatriptan (MAXALT) 10 MG tablet Take 1 tablet (10 mg total) by mouth as needed for migraine. May repeat in 2 hours if needed 10 tablet 1   sertraline (ZOLOFT) 25 MG tablet Take 2 tablets (50 mg total) by mouth daily. (Patient taking differently: Take 25 mg by mouth daily.) 60 tablet 2   No facility-administered medications prior  to visit.    Allergies  Allergen Reactions   Sumatriptan Nausea And Vomiting    Severe nausea and vomiting   Sulfa Antibiotics Rash    ROS Review of Systems  Constitutional:  Positive for activity change. Negative for appetite change, chills, fatigue and fever.       Due to left ankle/foot surgery.  HENT:  Negative for congestion, postnasal drip, rhinorrhea, sinus pressure, sinus pain, sneezing and sore throat.   Eyes: Negative.   Respiratory:  Negative for cough, chest tightness, shortness of breath and wheezing.   Cardiovascular:  Negative for chest pain and palpitations.  Gastrointestinal:  Negative for abdominal pain, constipation, diarrhea, nausea and vomiting.  Endocrine: Negative for cold intolerance, heat intolerance,  polydipsia and polyuria.  Genitourinary:  Negative for dyspareunia, dysuria, flank pain, frequency and urgency.  Musculoskeletal:  Positive for arthralgias and myalgias. Negative for back pain.  Skin:  Negative for rash.  Allergic/Immunologic: Negative for environmental allergies.  Neurological:  Negative for dizziness, weakness and headaches.  Hematological:  Negative for adenopathy.  Psychiatric/Behavioral:  Positive for dysphoric mood. The patient is not nervous/anxious.      Objective:    Physical Exam Vitals and nursing note reviewed.  Constitutional:      Appearance: Normal appearance. She is well-developed.  HENT:     Head: Normocephalic and atraumatic.     Nose: Nose normal.     Mouth/Throat:     Mouth: Mucous membranes are moist.     Pharynx: Oropharynx is clear.  Eyes:     Extraocular Movements: Extraocular movements intact.     Conjunctiva/sclera: Conjunctivae normal.     Pupils: Pupils are equal, round, and reactive to light.  Cardiovascular:     Rate and Rhythm: Normal rate and regular rhythm.     Pulses: Normal pulses.     Heart sounds: Normal heart sounds.  Pulmonary:     Effort: Pulmonary effort is normal.     Breath sounds: Normal breath sounds.  Abdominal:     Palpations: Abdomen is soft.  Musculoskeletal:        General: Normal range of motion.     Cervical back: Normal range of motion and neck supple.     Comments: Left ankle currently in a surgical boot.  Lymphadenopathy:     Cervical: No cervical adenopathy.  Skin:    General: Skin is warm and dry.     Capillary Refill: Capillary refill takes less than 2 seconds.  Neurological:     General: No focal deficit present.     Mental Status: She is alert and oriented to person, place, and time.  Psychiatric:        Mood and Affect: Mood normal.        Behavior: Behavior normal.        Thought Content: Thought content normal.        Judgment: Judgment normal.    Today's Vitals   09/18/21 1514  BP:  123/86  Pulse: (!) 104  Temp: 98.1 F (36.7 C)  SpO2: 97%  Weight: 169 lb 6.4 oz (76.8 kg)  Height: 5' 3"  (1.6 m)   Body mass index is 30.01 kg/m.   Wt Readings from Last 3 Encounters:  09/18/21 169 lb 6.4 oz (76.8 kg)  08/19/21 160 lb 15 oz (73 kg)  07/07/21 153 lb 12.8 oz (69.8 kg)     Health Maintenance Due  Topic Date Due   HIV Screening  Never done   Hepatitis C Screening  Never done   PAP SMEAR-Modifier  01/12/2020   COVID-19 Vaccine (3 - Booster for Pfizer series) 01/20/2020    There are no preventive care reminders to display for this patient.  Lab Results  Component Value Date   TSH 0.768 03/13/2021   Lab Results  Component Value Date   WBC 7.5 03/13/2021   HGB 13.7 03/13/2021   HCT 41.1 03/13/2021   MCV 90 03/13/2021   PLT 210 03/13/2021   Lab Results  Component Value Date   NA 139 03/13/2021   K 4.3 03/13/2021   CO2 22 03/13/2021   GLUCOSE 81 03/13/2021   BUN 13 03/13/2021   CREATININE 0.63 03/13/2021   BILITOT 0.4 03/13/2021   ALKPHOS 48 03/13/2021   AST 7 03/13/2021   ALT 4 03/13/2021   PROT 7.3 03/13/2021   ALBUMIN 4.4 03/13/2021   CALCIUM 8.8 03/13/2021   EGFR 116 03/13/2021   GFR 144.81 06/22/2017   Lab Results  Component Value Date   CHOL 134 03/13/2021   Lab Results  Component Value Date   HDL 45 03/13/2021   Lab Results  Component Value Date   LDLCALC 69 03/13/2021   Lab Results  Component Value Date   TRIG 112 03/13/2021   Lab Results  Component Value Date   CHOLHDL 3.0 03/13/2021   Lab Results  Component Value Date   HGBA1C 5.2 03/13/2021      Assessment & Plan:  1. Generalized anxiety disorder Stable.  Continue sertraline 25 mg tablets daily. - sertraline (ZOLOFT) 25 MG tablet; Take 1 tablet (25 mg total) by mouth daily.  Dispense: 90 tablet; Refill: 1  2. Body mass index (BMI) of 30.0-30.9 in adult Discussed lowering calorie intake to 1500 calories per day.  Ability to exercise currently limited due to  recent surgery on left ankle.  She should gradually incorporate exercise into her daily routine as recovery allows.  3. Left foot pain Recent left ankle surgery.  Patient currently in postop visit.  Following with orthopedic surgery.   Problem List Items Addressed This Visit       Other   Generalized anxiety disorder - Primary   Relevant Medications   sertraline (ZOLOFT) 25 MG tablet   Body mass index (BMI) of 30.0-30.9 in adult   Left foot pain    Meds ordered this encounter  Medications   sertraline (ZOLOFT) 25 MG tablet    Sig: Take 1 tablet (25 mg total) by mouth daily.    Dispense:  90 tablet    Refill:  1    Order Specific Question:   Supervising Provider    Answer:   Beatrice Lecher D [[id number]]    Follow-up: Return in about 6 months (around 03/18/2022) for health maintenance exam, FBW a week prior to visit. add vitamin d.    Ronnell Freshwater, NP

## 2021-09-28 DIAGNOSIS — Z6833 Body mass index (BMI) 33.0-33.9, adult: Secondary | ICD-10-CM | POA: Insufficient documentation

## 2021-09-28 DIAGNOSIS — Z6830 Body mass index (BMI) 30.0-30.9, adult: Secondary | ICD-10-CM | POA: Insufficient documentation

## 2021-09-28 DIAGNOSIS — M79672 Pain in left foot: Secondary | ICD-10-CM | POA: Insufficient documentation

## 2021-09-28 DIAGNOSIS — Z6834 Body mass index (BMI) 34.0-34.9, adult: Secondary | ICD-10-CM | POA: Insufficient documentation

## 2021-10-06 DIAGNOSIS — M25572 Pain in left ankle and joints of left foot: Secondary | ICD-10-CM | POA: Diagnosis not present

## 2021-10-06 DIAGNOSIS — Z9889 Other specified postprocedural states: Secondary | ICD-10-CM | POA: Diagnosis not present

## 2021-10-13 DIAGNOSIS — M25372 Other instability, left ankle: Secondary | ICD-10-CM | POA: Diagnosis not present

## 2021-10-13 DIAGNOSIS — M25672 Stiffness of left ankle, not elsewhere classified: Secondary | ICD-10-CM | POA: Diagnosis not present

## 2021-10-13 DIAGNOSIS — M19072 Primary osteoarthritis, left ankle and foot: Secondary | ICD-10-CM | POA: Diagnosis not present

## 2021-10-17 DIAGNOSIS — M25372 Other instability, left ankle: Secondary | ICD-10-CM | POA: Diagnosis not present

## 2021-10-17 DIAGNOSIS — M19072 Primary osteoarthritis, left ankle and foot: Secondary | ICD-10-CM | POA: Diagnosis not present

## 2021-10-17 DIAGNOSIS — M25672 Stiffness of left ankle, not elsewhere classified: Secondary | ICD-10-CM | POA: Diagnosis not present

## 2021-10-21 DIAGNOSIS — M25372 Other instability, left ankle: Secondary | ICD-10-CM | POA: Diagnosis not present

## 2021-10-21 DIAGNOSIS — M19072 Primary osteoarthritis, left ankle and foot: Secondary | ICD-10-CM | POA: Diagnosis not present

## 2021-10-21 DIAGNOSIS — M25672 Stiffness of left ankle, not elsewhere classified: Secondary | ICD-10-CM | POA: Diagnosis not present

## 2021-10-24 DIAGNOSIS — M25672 Stiffness of left ankle, not elsewhere classified: Secondary | ICD-10-CM | POA: Diagnosis not present

## 2021-10-24 DIAGNOSIS — M19072 Primary osteoarthritis, left ankle and foot: Secondary | ICD-10-CM | POA: Diagnosis not present

## 2021-10-24 DIAGNOSIS — M25372 Other instability, left ankle: Secondary | ICD-10-CM | POA: Diagnosis not present

## 2021-10-28 DIAGNOSIS — M25672 Stiffness of left ankle, not elsewhere classified: Secondary | ICD-10-CM | POA: Diagnosis not present

## 2021-10-28 DIAGNOSIS — M19072 Primary osteoarthritis, left ankle and foot: Secondary | ICD-10-CM | POA: Diagnosis not present

## 2021-10-28 DIAGNOSIS — M25372 Other instability, left ankle: Secondary | ICD-10-CM | POA: Diagnosis not present

## 2021-10-30 DIAGNOSIS — M25372 Other instability, left ankle: Secondary | ICD-10-CM | POA: Diagnosis not present

## 2021-10-30 DIAGNOSIS — M19072 Primary osteoarthritis, left ankle and foot: Secondary | ICD-10-CM | POA: Diagnosis not present

## 2021-10-30 DIAGNOSIS — M25672 Stiffness of left ankle, not elsewhere classified: Secondary | ICD-10-CM | POA: Diagnosis not present

## 2021-11-04 DIAGNOSIS — M25372 Other instability, left ankle: Secondary | ICD-10-CM | POA: Diagnosis not present

## 2021-11-04 DIAGNOSIS — M19072 Primary osteoarthritis, left ankle and foot: Secondary | ICD-10-CM | POA: Diagnosis not present

## 2021-11-04 DIAGNOSIS — M25672 Stiffness of left ankle, not elsewhere classified: Secondary | ICD-10-CM | POA: Diagnosis not present

## 2021-11-06 DIAGNOSIS — M19072 Primary osteoarthritis, left ankle and foot: Secondary | ICD-10-CM | POA: Diagnosis not present

## 2021-11-06 DIAGNOSIS — M25372 Other instability, left ankle: Secondary | ICD-10-CM | POA: Diagnosis not present

## 2021-11-06 DIAGNOSIS — M25672 Stiffness of left ankle, not elsewhere classified: Secondary | ICD-10-CM | POA: Diagnosis not present

## 2021-12-08 DIAGNOSIS — M25672 Stiffness of left ankle, not elsewhere classified: Secondary | ICD-10-CM | POA: Diagnosis not present

## 2021-12-08 DIAGNOSIS — M19072 Primary osteoarthritis, left ankle and foot: Secondary | ICD-10-CM | POA: Diagnosis not present

## 2021-12-08 DIAGNOSIS — M25372 Other instability, left ankle: Secondary | ICD-10-CM | POA: Diagnosis not present

## 2021-12-10 DIAGNOSIS — M25372 Other instability, left ankle: Secondary | ICD-10-CM | POA: Diagnosis not present

## 2021-12-10 DIAGNOSIS — M25672 Stiffness of left ankle, not elsewhere classified: Secondary | ICD-10-CM | POA: Diagnosis not present

## 2021-12-10 DIAGNOSIS — M19072 Primary osteoarthritis, left ankle and foot: Secondary | ICD-10-CM | POA: Diagnosis not present

## 2022-01-09 DIAGNOSIS — M25572 Pain in left ankle and joints of left foot: Secondary | ICD-10-CM | POA: Diagnosis not present

## 2022-02-06 DIAGNOSIS — M25572 Pain in left ankle and joints of left foot: Secondary | ICD-10-CM | POA: Diagnosis not present

## 2022-02-06 DIAGNOSIS — M25372 Other instability, left ankle: Secondary | ICD-10-CM | POA: Diagnosis not present

## 2022-03-11 ENCOUNTER — Other Ambulatory Visit: Payer: Self-pay | Admitting: Nurse Practitioner

## 2022-03-11 DIAGNOSIS — F411 Generalized anxiety disorder: Secondary | ICD-10-CM

## 2022-03-12 DIAGNOSIS — Z01419 Encounter for gynecological examination (general) (routine) without abnormal findings: Secondary | ICD-10-CM | POA: Diagnosis not present

## 2022-03-12 DIAGNOSIS — Z1231 Encounter for screening mammogram for malignant neoplasm of breast: Secondary | ICD-10-CM | POA: Diagnosis not present

## 2022-03-12 DIAGNOSIS — R635 Abnormal weight gain: Secondary | ICD-10-CM | POA: Diagnosis not present

## 2022-03-17 ENCOUNTER — Encounter: Payer: 59 | Admitting: Nurse Practitioner

## 2022-03-23 ENCOUNTER — Other Ambulatory Visit: Payer: 59

## 2022-03-23 DIAGNOSIS — F411 Generalized anxiety disorder: Secondary | ICD-10-CM

## 2022-03-23 DIAGNOSIS — Z6830 Body mass index (BMI) 30.0-30.9, adult: Secondary | ICD-10-CM | POA: Diagnosis not present

## 2022-03-23 DIAGNOSIS — Z0000 Encounter for general adult medical examination without abnormal findings: Secondary | ICD-10-CM

## 2022-03-24 ENCOUNTER — Ambulatory Visit (INDEPENDENT_AMBULATORY_CARE_PROVIDER_SITE_OTHER): Payer: Self-pay | Admitting: Nurse Practitioner

## 2022-03-24 ENCOUNTER — Encounter: Payer: Self-pay | Admitting: Nurse Practitioner

## 2022-03-24 VITALS — BP 109/79 | HR 76 | Temp 98.7°F | Ht 63.0 in | Wt 182.0 lb

## 2022-03-24 DIAGNOSIS — F411 Generalized anxiety disorder: Secondary | ICD-10-CM

## 2022-03-24 DIAGNOSIS — Z0001 Encounter for general adult medical examination with abnormal findings: Secondary | ICD-10-CM

## 2022-03-24 DIAGNOSIS — G8929 Other chronic pain: Secondary | ICD-10-CM

## 2022-03-24 DIAGNOSIS — M25572 Pain in left ankle and joints of left foot: Secondary | ICD-10-CM

## 2022-03-24 LAB — COMPREHENSIVE METABOLIC PANEL
ALT: 6 IU/L (ref 0–32)
AST: 13 IU/L (ref 0–40)
Albumin/Globulin Ratio: 1.5 (ref 1.2–2.2)
Albumin: 4.4 g/dL (ref 3.9–4.9)
Alkaline Phosphatase: 79 IU/L (ref 44–121)
BUN/Creatinine Ratio: 14 (ref 9–23)
BUN: 10 mg/dL (ref 6–24)
Bilirubin Total: 0.4 mg/dL (ref 0.0–1.2)
CO2: 21 mmol/L (ref 20–29)
Calcium: 9.1 mg/dL (ref 8.7–10.2)
Chloride: 105 mmol/L (ref 96–106)
Creatinine, Ser: 0.7 mg/dL (ref 0.57–1.00)
Globulin, Total: 2.9 g/dL (ref 1.5–4.5)
Glucose: 96 mg/dL (ref 70–99)
Potassium: 4.1 mmol/L (ref 3.5–5.2)
Sodium: 140 mmol/L (ref 134–144)
Total Protein: 7.3 g/dL (ref 6.0–8.5)
eGFR: 111 mL/min/{1.73_m2} (ref >59)

## 2022-03-24 LAB — CBC
Hematocrit: 41.2 % (ref 34.0–46.6)
Hemoglobin: 13.6 g/dL (ref 11.1–15.9)
MCH: 28.4 pg (ref 26.6–33.0)
MCHC: 33 g/dL (ref 31.5–35.7)
MCV: 86 fL (ref 79–97)
Platelets: 224 10*3/uL (ref 150–450)
RBC: 4.79 x10E6/uL (ref 3.77–5.28)
RDW: 12.7 % (ref 11.7–15.4)
WBC: 6.5 10*3/uL (ref 3.4–10.8)

## 2022-03-24 LAB — LIPID PANEL
Chol/HDL Ratio: 3.3 ratio (ref 0.0–4.4)
Cholesterol, Total: 161 mg/dL (ref 100–199)
HDL: 49 mg/dL (ref >39)
LDL Chol Calc (NIH): 91 mg/dL (ref 0–99)
Triglycerides: 117 mg/dL (ref 0–149)
VLDL Cholesterol Cal: 21 mg/dL (ref 5–40)

## 2022-03-24 LAB — HEMOGLOBIN A1C
Est. average glucose Bld gHb Est-mCnc: 105 mg/dL
Hgb A1c MFr Bld: 5.3 % (ref 4.8–5.6)

## 2022-03-24 LAB — TSH: TSH: 1.12 u[IU]/mL (ref 0.450–4.500)

## 2022-03-24 MED ORDER — SERTRALINE HCL 50 MG PO TABS: 50.0000 mg | ORAL_TABLET | Freq: Every day | ORAL | 3 refills | Status: DC

## 2022-03-24 NOTE — Progress Notes (Signed)
Complete physical exam   Patient: Caitlin Bryant   DOB: [birth year]   41 y.o. Female  MRN: [id number] Visit Date: 03/24/2022    Chief Complaint  Patient presents with   Annual Exam    Patient presents today for annual physical. She would like to discuss dosage on zoloft.   Subjective    Caitlin Bryant is a 41 y.o. female who presents today for a complete physical exam.  She reports consuming a  generally healthy  diet. Exercise is limited by orthopedic condition(s): left ankle pain after surgery. Set up to have nerve conduction study. She generally feels well. She does have additional problems to discuss today.   HPI HPI     Annual Exam    Additional comments: Patient presents today for annual physical. She would like to discuss dosage on zoloft.      Last edited by Pennelope Bracken, CMA on 03/24/2022 10:14 AM.      Annual physical  -routine, fasting labs done prior to this visit.  --results all normal -takes sertraline 25 mg daily to treat generalized anxiety  -takes maxalt as needed for migraine headaches  -persistent pain in left ankle. Continues to see orthopedic surgeon. Scheduled to have nerve conduction study later this month   Past Medical History:  Diagnosis Date   Anxiety    Headache    MRSA (methicillin resistant staph aureus) culture positive    5 or more years ago, right thumb   Past Surgical History:  Procedure Laterality Date   ANKLE ARTHROSCOPY WITH RECONSTRUCTION Left 08/19/2021   Procedure: LEFT ANKLE ARTHROSCOPY, ARTHROSCOPIC TREATMENT OF TALUS OSTEOCHONDRAL LESION, LATERAL LIGAMENT RECONSTRUCTION, PERONEAL TENDON DEBRIDEMENT, REPAIR OF DISLOCATING PERONEAL TENDONS;  Surgeon: Erle Crocker, MD;  Location: [facility];  Service: Orthopedics;  Laterality: Left;  LENGTH OF SURGERY: 120 MINUTES   CESAREAN SECTION  2004 and 2008   x 2   DIAGNOSTIC LAPAROSCOPY     TONSILLECTOMY     Social History   Socioeconomic History    Marital status: Married    Spouse name: Not on file   Number of children: Not on file   Years of education: Not on file   Highest education level: Not on file  Occupational History   Not on file  Tobacco Use   Smoking status: Never   Smokeless tobacco: Never  Vaping Use   Vaping Use: Never used  Substance and Sexual Activity   Alcohol use: Yes    Comment: 1 a month or less   Drug use: No   Sexual activity: Yes    Partners: Male  Other Topics Concern   Not on file  Social History Narrative   Not on file   Social Determinants of Health   Financial Resource Strain: Not on file  Food Insecurity: Not on file  Transportation Needs: Not on file  Physical Activity: Not on file  Stress: Not on file  Social Connections: Not on file  Intimate Partner Violence: Not on file   Family Status  Relation Name Status   Mother  Alive   Father  Alive   Family History  Problem Relation Age of Onset   Hypertension Mother    Diabetes Mother    Osteoarthritis Mother    Fibromyalgia Mother    Heart attack Father        Defibrillator   Allergies  Allergen Reactions   Sumatriptan Nausea And Vomiting    Severe nausea and vomiting  Sulfa Antibiotics Rash    Patient Care Team: Ronnell Freshwater, NP as PCP - General (Family Medicine)   Medications: Outpatient Medications Prior to Visit  Medication Sig   cetirizine (ZYRTEC) 10 MG tablet Take 10 mg by mouth daily.   fluticasone (FLONASE) 50 MCG/ACT nasal spray Place into both nostrils daily.   ondansetron (ZOFRAN ODT) 4 MG disintegrating tablet Take 1 tablet (4 mg total) by mouth every 8 (eight) hours as needed for nausea or vomiting.   rizatriptan (MAXALT) 10 MG tablet Take 1 tablet (10 mg total) by mouth as needed for migraine. May repeat in 2 hours if needed   [DISCONTINUED] sertraline (ZOLOFT) 25 MG tablet TAKE 1 TABLET BY MOUTH DAILY   No facility-administered medications prior to visit.    Review of Systems  Constitutional:   Negative for activity change, appetite change, chills, fatigue and fever.       Activity limited due to left foot/ankle pain after surgery   HENT:  Negative for congestion, postnasal drip, rhinorrhea, sinus pressure, sinus pain, sneezing and sore throat.   Eyes: Negative.   Respiratory:  Negative for cough, chest tightness, shortness of breath and wheezing.   Cardiovascular:  Negative for chest pain and palpitations.  Gastrointestinal:  Negative for abdominal pain, constipation, diarrhea, nausea and vomiting.  Endocrine: Negative for cold intolerance, heat intolerance, polydipsia and polyuria.  Genitourinary:  Negative for dyspareunia, dysuria, flank pain, frequency and urgency.  Musculoskeletal:  Positive for arthralgias, gait problem and myalgias. Negative for back pain.       Pain in left foot and ankle   Skin:  Negative for rash.  Allergic/Immunologic: Positive for environmental allergies.  Neurological:  Negative for dizziness, weakness and headaches.  Hematological:  Negative for adenopathy.  Psychiatric/Behavioral:  Positive for dysphoric mood. The patient is nervous/anxious.     Last CBC Lab Results  Component Value Date   WBC 6.5 03/23/2022   HGB 13.6 03/23/2022   HCT 41.2 03/23/2022   MCV 86 03/23/2022   MCH 28.4 03/23/2022   RDW 12.7 03/23/2022   PLT 224 51/76/1607   Last metabolic panel Lab Results  Component Value Date   GLUCOSE 96 03/23/2022   NA 140 03/23/2022   K 4.1 03/23/2022   CL 105 03/23/2022   CO2 21 03/23/2022   BUN 10 03/23/2022   CREATININE 0.70 03/23/2022   EGFR 111 03/23/2022   CALCIUM 9.1 03/23/2022   PROT 7.3 03/23/2022   ALBUMIN 4.4 03/23/2022   LABGLOB 2.9 03/23/2022   AGRATIO 1.5 03/23/2022   BILITOT 0.4 03/23/2022   ALKPHOS 79 03/23/2022   AST 13 03/23/2022   ALT 6 03/23/2022   Last lipids Lab Results  Component Value Date   CHOL 161 03/23/2022   HDL 49 03/23/2022   LDLCALC 91 03/23/2022   TRIG 117 03/23/2022   CHOLHDL 3.3  03/23/2022   Last hemoglobin A1c Lab Results  Component Value Date   HGBA1C 5.3 03/23/2022   Last thyroid functions Lab Results  Component Value Date   TSH 1.120 03/23/2022   Last vitamin D Lab Results  Component Value Date   VD25OH 42.9 03/13/2021       Objective     Today's Vitals   03/24/22 1015  BP: 109/79  Pulse: 76  Temp: 98.7 F (37.1 C)  SpO2: 98%  Weight: 182 lb (82.6 kg)  Height: _0  (1.6 m)   Body mass index is 32.24 kg/m.   BP Readings from Last 3 Encounters:  03/24/22 109/79  09/18/21 123/86  08/19/21 130/78    Wt Readings from Last 3 Encounters:  03/24/22 182 lb (82.6 kg)  09/18/21 169 lb 6.4 oz (76.8 kg)  08/19/21 160 lb 15 oz (73 kg)     Physical Exam Vitals and nursing note reviewed.  Constitutional:      Appearance: Normal appearance. She is well-developed.  HENT:     Head: Normocephalic and atraumatic.     Right Ear: Tympanic membrane, ear canal and external ear normal.     Left Ear: Tympanic membrane, ear canal and external ear normal.     Mouth/Throat:     Mouth: Mucous membranes are moist.     Pharynx: Oropharynx is clear.  Eyes:     Extraocular Movements: Extraocular movements intact.     Conjunctiva/sclera: Conjunctivae normal.     Pupils: Pupils are equal, round, and reactive to light.  Cardiovascular:     Rate and Rhythm: Normal rate and regular rhythm.     Pulses: Normal pulses.     Heart sounds: Normal heart sounds.  Pulmonary:     Effort: Pulmonary effort is normal.     Breath sounds: Normal breath sounds.  Abdominal:     General: Bowel sounds are normal. There is no distension.     Palpations: Abdomen is soft. There is no mass.     Tenderness: There is no abdominal tenderness. There is no right CVA tenderness, left CVA tenderness, guarding or rebound.     Hernia: No hernia is present.  Musculoskeletal:        General: Normal range of motion.     Cervical back: Normal range of motion and neck supple.      Comments: Limited ROM and strength of left ankle.   Lymphadenopathy:     Cervical: No cervical adenopathy.  Skin:    General: Skin is warm and dry.     Capillary Refill: Capillary refill takes less than 2 seconds.  Neurological:     General: No focal deficit present.     Mental Status: She is alert and oriented to person, place, and time.  Psychiatric:        Attention and Perception: Attention and perception normal.        Mood and Affect: Affect normal. Mood is anxious.        Speech: Speech normal.        Behavior: Behavior normal. Behavior is cooperative.        Thought Content: Thought content normal.        Cognition and Memory: Cognition and memory normal.        Judgment: Judgment normal.       Last depression screening scores    03/24/2022   10:17 AM 07/07/2021   11:04 AM 06/12/2021    4:09 PM  PHQ 2/9 Scores  PHQ - 2 Score 1 1 0  PHQ- 9 Score _0 Exception Documentation Medical reason     Last fall risk screening    07/07/2021   11:04 AM  [place] in the past year? 0  Number falls in past yr: 0  Injury with Fall? 0  Follow up Falls evaluation completed     Assessment & Plan    1. Encounter for general adult medical examination with abnormal findings Annual physical today   2. Chronic pain of left ankle Patient seeing orthopedic surgeon for management   3. Generalized anxiety disorder Increased family and situational stress.  Increase sertraline to 50 mg daily. Reassess in 6 weeks  - sertraline (ZOLOFT) 50 MG tablet; Take 1 tablet (50 mg total) by mouth daily.  Dispense: 30 tablet; Refill: 3      Immunization History  Administered Date(s) Administered   Influenza,inj,Quad PF,6+ Mos 06/22/2017, 04/20/2018, 06/09/2019, 06/16/2021   PFIZER(Purple Top)SARS-COV-2 Vaccination 11/04/2019, 11/25/2019   Tdap 08/24/2012    Health Maintenance  Topic Date Due   HIV Screening  Never done   Hepatitis C Screening  Never done   PAP SMEAR-Modifier   01/12/2020   COVID-19 Vaccine (3 - Pfizer series) 01/20/2020   INFLUENZA VACCINE  03/24/2022   TETANUS/TDAP  08/24/2022   HPV VACCINES  Aged Out    Discussed health benefits of physical activity, and encouraged her to engage in regular exercise appropriate for her age and condition.  Problem List Items Addressed This Visit       Other   Generalized anxiety disorder   Relevant Medications   sertraline (ZOLOFT) 50 MG tablet   Chronic pain of left ankle   Relevant Medications   sertraline (ZOLOFT) 50 MG tablet   Other Visit Diagnoses     Encounter for general adult medical examination with abnormal findings    -  Primary        Return in about 6 weeks (around 05/05/2022) for mood. increased soloft. Marland Kitchen        Ronnell Freshwater, NP  [facility] at [facility] [phone] (phone) [phone] (fax)  [place]

## 2022-04-06 DIAGNOSIS — R200 Anesthesia of skin: Secondary | ICD-10-CM | POA: Diagnosis not present

## 2022-04-17 DIAGNOSIS — M25372 Other instability, left ankle: Secondary | ICD-10-CM | POA: Diagnosis not present

## 2022-04-17 DIAGNOSIS — M25572 Pain in left ankle and joints of left foot: Secondary | ICD-10-CM | POA: Diagnosis not present

## 2022-04-30 ENCOUNTER — Telehealth: Payer: Self-pay | Admitting: Nurse Practitioner

## 2022-04-30 NOTE — Telephone Encounter (Signed)
Per Herbert Seta she addressed the issue with her at her last visit she has an upcoming appt next week she will discuss this at her appt time

## 2022-04-30 NOTE — Telephone Encounter (Signed)
Patient is requesting a referral to Ortho for a second opinion? Please advise.

## 2022-05-04 ENCOUNTER — Encounter: Payer: Self-pay | Admitting: Neurology

## 2022-05-05 ENCOUNTER — Ambulatory Visit: Payer: Self-pay | Admitting: Nurse Practitioner

## 2022-05-06 ENCOUNTER — Encounter: Payer: Self-pay | Admitting: Nurse Practitioner

## 2022-05-06 ENCOUNTER — Ambulatory Visit (INDEPENDENT_AMBULATORY_CARE_PROVIDER_SITE_OTHER): Payer: Self-pay | Admitting: Nurse Practitioner

## 2022-05-06 VITALS — BP 110/80 | HR 80 | Ht 63.0 in | Wt 188.4 lb

## 2022-05-06 DIAGNOSIS — S8412XA Injury of peroneal nerve at lower leg level, left leg, initial encounter: Secondary | ICD-10-CM

## 2022-05-06 DIAGNOSIS — S8422XA Injury of cutaneous sensory nerve at lower leg level, left leg, initial encounter: Secondary | ICD-10-CM

## 2022-05-06 DIAGNOSIS — B372 Candidiasis of skin and nail: Secondary | ICD-10-CM

## 2022-05-06 DIAGNOSIS — F411 Generalized anxiety disorder: Secondary | ICD-10-CM

## 2022-05-06 MED ORDER — SERTRALINE HCL 50 MG PO TABS: 75.0000 mg | ORAL_TABLET | Freq: Every day | ORAL | 3 refills | Status: DC

## 2022-05-06 NOTE — Patient Instructions (Signed)
Use over the counter Clotrimazole for rash under the breasts

## 2022-05-06 NOTE — Progress Notes (Signed)
Established patient visit   Patient: Caitlin Bryant   DOB: 04-06-1981   41 y.o. Female  MRN: [id number] Visit Date: 05/06/2022   Chief Complaint  Patient presents with   Follow-up   Subjective    HPI  Follow up mood.  -increased sertraline to 50 mg daily.  -EMG done per orthopedics which show sensory nerve disruption in her left foot which did not start until after having surgery.  --?refer to neurology - orthopedic provider did refer to [facility] and they needed different referral as this was technically a "2nd opinion."  She will not be seen until November.    Medications: Outpatient Medications Prior to Visit  Medication Sig   cetirizine (ZYRTEC) 10 MG tablet Take 10 mg by mouth daily.   fluticasone (FLONASE) 50 MCG/ACT nasal spray Place into both nostrils daily.   ondansetron (ZOFRAN ODT) 4 MG disintegrating tablet Take 1 tablet (4 mg total) by mouth every 8 (eight) hours as needed for nausea or vomiting.   rizatriptan (MAXALT) 10 MG tablet Take 1 tablet (10 mg total) by mouth as needed for migraine. May repeat in 2 hours if needed   [DISCONTINUED] sertraline (ZOLOFT) 50 MG tablet Take 1 tablet (50 mg total) by mouth daily.   No facility-administered medications prior to visit.    Review of Systems  Constitutional:  Positive for appetite change. Negative for activity change, chills, fatigue and fever.  HENT:  Negative for congestion, postnasal drip, rhinorrhea, sinus pressure, sinus pain, sneezing and sore throat.   Eyes: Negative.   Respiratory:  Negative for cough, chest tightness, shortness of breath and wheezing.   Cardiovascular:  Negative for chest pain and palpitations.  Gastrointestinal:  Negative for abdominal pain, constipation, diarrhea, nausea and vomiting.  Endocrine: Negative for cold intolerance, heat intolerance, polydipsia and polyuria.  Genitourinary:  Negative for dyspareunia, dysuria, flank pain, frequency and urgency.  Musculoskeletal:  Positive for  arthralgias and myalgias. Negative for back pain.       Left foot weakness and decreased sensation.   Skin:  Negative for rash.       Redness and itchiness of the skin underneath  the breasts   Allergic/Immunologic: Negative for environmental allergies.  Neurological:  Positive for numbness. Negative for dizziness, weakness and headaches.  Hematological:  Negative for adenopathy.  Psychiatric/Behavioral:  The patient is nervous/anxious.        Objective     Today's Vitals   05/06/22 1034  BP: 110/80  Pulse: 80  SpO2: 100%  Weight: 188 lb 6.4 oz (85.5 kg)  Height: 5\' 3"  (1.6 m)   Body mass index is 33.37 kg/m.   BP Readings from Last 3 Encounters:  05/06/22 110/80  03/24/22 109/79  09/18/21 123/86    Wt Readings from Last 3 Encounters:  05/06/22 188 lb 6.4 oz (85.5 kg)  03/24/22 182 lb (82.6 kg)  09/18/21 169 lb 6.4 oz (76.8 kg)    Physical Exam Vitals and nursing note reviewed.  Constitutional:      Appearance: Normal appearance. She is well-developed.  HENT:     Head: Normocephalic and atraumatic.     Nose: Nose normal.     Mouth/Throat:     Mouth: Mucous membranes are moist.     Pharynx: Oropharynx is clear.  Eyes:     Extraocular Movements: Extraocular movements intact.     Conjunctiva/sclera: Conjunctivae normal.     Pupils: Pupils are equal, round, and reactive to light.  Cardiovascular:     Rate  and Rhythm: Normal rate and regular rhythm.     Pulses: Normal pulses.     Heart sounds: Normal heart sounds.  Pulmonary:     Effort: Pulmonary effort is normal.     Breath sounds: Normal breath sounds.  Abdominal:     Palpations: Abdomen is soft.  Musculoskeletal:        General: Normal range of motion.     Cervical back: Normal range of motion and neck supple.     Comments: Decreased strength, sensation, and ROM lf left foot and ankle  Lymphadenopathy:     Cervical: No cervical adenopathy.  Skin:    General: Skin is warm and dry.     Capillary Refill:  Capillary refill takes less than 2 seconds.     Comments: Redness of the skin underneath  the breasts   Neurological:     General: No focal deficit present.     Mental Status: She is alert and oriented to person, place, and time.  Psychiatric:        Attention and Perception: Attention and perception normal.        Mood and Affect: Affect normal. Mood is anxious.        Speech: Speech normal.        Behavior: Behavior normal. Behavior is cooperative.        Thought Content: Thought content normal.        Cognition and Memory: Cognition and memory normal.        Judgment: Judgment normal.       Assessment & Plan    1. Generalized anxiety disorder Increase sertraline to 75 mg daily. Reassess In  six weeks  - sertraline (ZOLOFT) 50 MG tablet; Take 1.5 tablets (75 mg total) by mouth daily.  Dispense: 45 tablet; Refill: 3  2. 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 Will refer to new neurology provider for 2nd opinion.  - Ambulatory referral to Neurology  3. Injury of left peroneal nerve, initial encounter EMG done indicates injury to the peroneal nerve. Will refer to new neurology provider for further evaluation  and 2nd opinion.  - Ambulatory referral to Neurology  4. Cutaneous candidiasis Recommend she use over the counter clotrimazole cream as indicated to  irritated areas as needed.    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         Carlean Jews, NP  [facility] at [facility] [phone] (phone) [phone] (fax)  [facility]

## 2022-05-24 DIAGNOSIS — S8412XA Injury of peroneal nerve at lower leg level, left leg, initial encounter: Secondary | ICD-10-CM | POA: Insufficient documentation

## 2022-05-24 DIAGNOSIS — B372 Candidiasis of skin and nail: Secondary | ICD-10-CM | POA: Insufficient documentation

## 2022-05-24 DIAGNOSIS — S8422XA Injury of cutaneous sensory nerve at lower leg level, left leg, initial encounter: Secondary | ICD-10-CM | POA: Insufficient documentation

## 2022-05-24 DIAGNOSIS — G90522 Complex regional pain syndrome I of left lower limb: Secondary | ICD-10-CM | POA: Insufficient documentation

## 2022-06-10 ENCOUNTER — Ambulatory Visit (INDEPENDENT_AMBULATORY_CARE_PROVIDER_SITE_OTHER): Payer: 59

## 2022-06-10 ENCOUNTER — Telehealth: Payer: Self-pay | Admitting: Orthopedic Surgery

## 2022-06-10 VITALS — BP 126/83 | HR 74 | Wt 190.4 lb

## 2022-06-10 DIAGNOSIS — Z23 Encounter for immunization: Secondary | ICD-10-CM | POA: Diagnosis not present

## 2022-06-10 NOTE — Telephone Encounter (Signed)
Patient came in to set 2nd opinion appt with Marlou Sa she bought in medical records the need to be scanned into chart.

## 2022-06-10 NOTE — Progress Notes (Signed)
Patient is here for  Flu vax pt tolerated the injection w/o complications

## 2022-06-17 ENCOUNTER — Encounter: Payer: Self-pay | Admitting: Nurse Practitioner

## 2022-06-17 ENCOUNTER — Ambulatory Visit (INDEPENDENT_AMBULATORY_CARE_PROVIDER_SITE_OTHER): Payer: 59 | Admitting: Nurse Practitioner

## 2022-06-17 VITALS — BP 109/72 | HR 74 | Ht 63.0 in | Wt 190.8 lb

## 2022-06-17 DIAGNOSIS — Z6833 Body mass index (BMI) 33.0-33.9, adult: Secondary | ICD-10-CM

## 2022-06-17 DIAGNOSIS — F411 Generalized anxiety disorder: Secondary | ICD-10-CM | POA: Diagnosis not present

## 2022-06-17 DIAGNOSIS — S8412XD Injury of peroneal nerve at lower leg level, left leg, subsequent encounter: Secondary | ICD-10-CM | POA: Diagnosis not present

## 2022-06-17 DIAGNOSIS — R69 Illness, unspecified: Secondary | ICD-10-CM | POA: Diagnosis not present

## 2022-06-17 NOTE — Progress Notes (Signed)
Established patient visit   Patient: Caitlin Bryant   DOB: 1981-08-20   41 y.o. Female  MRN: [id number] Visit Date: 06/17/2022   Chief Complaint  Patient presents with   Follow-up   Subjective    HPI  Follow up  -generalized anxiety disorder  -increased sertraline to 75 mg daily  -she states that she has noted improvement in symptoms of anxiety and depression. She is tolerating the dose adjustment wel.  -?was referred to new neurology at most recent visit.  --appointment is next Friday.  -states that headaches are doing well.    Medications: Outpatient Medications Prior to Visit  Medication Sig   cetirizine (ZYRTEC) 10 MG tablet Take 10 mg by mouth daily.   fluticasone (FLONASE) 50 MCG/ACT nasal spray Place into both nostrils daily.   ondansetron (ZOFRAN ODT) 4 MG disintegrating tablet Take 1 tablet (4 mg total) by mouth every 8 (eight) hours as needed for nausea or vomiting.   rizatriptan (MAXALT) 10 MG tablet Take 1 tablet (10 mg total) by mouth as needed for migraine. May repeat in 2 hours if needed   sertraline (ZOLOFT) 50 MG tablet Take 1.5 tablets (75 mg total) by mouth daily.   No facility-administered medications prior to visit.    Review of Systems  Constitutional:  Negative for activity change, appetite change, chills, fatigue and fever.  HENT:  Negative for congestion, postnasal drip, rhinorrhea, sinus pressure, sinus pain, sneezing and sore throat.   Eyes: Negative.   Respiratory:  Negative for cough, chest tightness, shortness of breath and wheezing.   Cardiovascular:  Negative for chest pain and palpitations.  Gastrointestinal:  Negative for abdominal pain, constipation, diarrhea, nausea and vomiting.  Endocrine: Negative for cold intolerance, heat intolerance, polydipsia and polyuria.  Genitourinary:  Negative for dyspareunia, dysuria, flank pain, frequency and urgency.  Musculoskeletal:  Positive for arthralgias and myalgias. Negative for back pain.         Left foot weakness and decreased sensation.    Skin:  Negative for rash.  Allergic/Immunologic: Negative for environmental allergies.  Neurological:  Negative for dizziness, weakness and headaches.  Hematological:  Negative for adenopathy.  Psychiatric/Behavioral:  Positive for dysphoric mood. The patient is nervous/anxious.        Improved mood/anxiety since increased sertraline to 75 mg daily        Objective     Today's Vitals   06/17/22 1015  BP: 109/72  Pulse: 74  SpO2: 97%  Weight: 190 lb 12.8 oz (86.5 kg)  Height: 5\' 3"  (1.6 m)   Body mass index is 33.8 kg/m.  BP Readings from Last 3 Encounters:  06/17/22 109/72  06/10/22 126/83  05/06/22 110/80    Wt Readings from Last 3 Encounters:  06/17/22 190 lb 12.8 oz (86.5 kg)  06/10/22 190 lb 6.4 oz (86.4 kg)  05/06/22 188 lb 6.4 oz (85.5 kg)    Physical Exam Vitals and nursing note reviewed.  Constitutional:      Appearance: Normal appearance. She is well-developed.  HENT:     Head: Normocephalic and atraumatic.     Nose: Nose normal.     Mouth/Throat:     Mouth: Mucous membranes are moist.     Pharynx: Oropharynx is clear.  Eyes:     Extraocular Movements: Extraocular movements intact.     Conjunctiva/sclera: Conjunctivae normal.     Pupils: Pupils are equal, round, and reactive to light.  Cardiovascular:     Rate and Rhythm: Normal rate and regular rhythm.  Pulses: Normal pulses.     Heart sounds: Normal heart sounds.  Pulmonary:     Effort: Pulmonary effort is normal.     Breath sounds: Normal breath sounds.  Abdominal:     Palpations: Abdomen is soft.  Musculoskeletal:        General: Normal range of motion.     Cervical back: Normal range of motion and neck supple.     Comments: Decreased strength, sensation, and ROM lf left foot and ankle   Lymphadenopathy:     Cervical: No cervical adenopathy.  Skin:    General: Skin is warm and dry.     Capillary Refill: Capillary refill takes less than  2 seconds.  Neurological:     General: No focal deficit present.     Mental Status: She is alert and oriented to person, place, and time.  Psychiatric:        Mood and Affect: Mood normal.        Behavior: Behavior normal.        Thought Content: Thought content normal.        Judgment: Judgment normal.      Assessment & Plan    1. Generalized anxiety disorder Improved with increased dose sertraline at 75 mg. Continue daily. Reassess in 3 months   2. Injury of left peroneal nerve, subsequent encounter Patient is scheduled to see neurology on 06/26/2022. She has also scheduled a 2nd opinion appointment with orthopedics.   3. BMI 33.0-33.9,adult Discussed lowering calorie intake to 1500 calories per day. Ability to exercise currently limited due to pain and weakness of the left foot.     Problem List Items Addressed This Visit       Nervous and Auditory   Left peroneal nerve injury     Other   Generalized anxiety disorder - Primary   BMI 33.0-33.9,adult     Return in about 3 months (around 09/17/2022) for mood.         Ronnell Freshwater, NP  [facility] Primary Care at [facility] [phone] (phone) [phone] (fax)  [place]

## 2022-06-23 NOTE — Progress Notes (Signed)
Initial neurology clinic note  SERVICE DATE: 06/26/22  Reason for Evaluation: Consultation requested by Terance Hart, MD for an opinion regarding leg numbness. My final recommendations will be communicated back to the requesting physician by way of shared medical record or letter to requesting physician via [place] mail.  HPI: This is [name], a 41 y.o. right-handed female with a medical history of migraines, generalized anxiety who presents to neurology clinic with the chief complaint of left leg numbness. The patient is accompanied by husband and daughter.  Patient has always had weak ankles and spraining her ankles frequently since childhood. She had a bad injury going up the stairs (fall). She saw ortho, and patient had a left ankle tendon surgery in 07/2021. She had a nerve block at that time as well, behind her knee. The surgery went fine per patient. After a couple of days, she had major pain on the dorsal aspect of her great toe. The pain progressed and is now to the thigh. She went to PT. Per husband, there is also swelling. She is unable have anything touching the leg. The pain seems to worse at night. She describes rash and skin changes and now finds it difficult to move her leg. Stretching also hurts.  Patient is wondering what happened, if it is fixable, and is it the knee or the back?  Patient has previously been seen by Dr. Maple Hudson at [facility] for an EMG on 04/06/22 (see full results below) that showed sensory abnormalities.  Patient sees [facility] for left ankle pain and instability. Per documentation:   She has tried gabapentin but had to stop due to side effects (made her too tired and had ataxia). She had an outside EMG that showed absent sural, superficial peroneal, and saphenous sensory responses. She has tried capsaicin cream as well, but could not tolerate this.  Patient is on sertraline 75mg  daily for depression.  The patient does not report  any constitutional symptoms like fever, night sweats, anorexia or unintentional weight loss.  EtOH use: very rare  Restrictive diet? No Family history of neuropathy/myopathy/NM disease? No. Mom has OA and fibromyalgia.   MEDICATIONS:  Outpatient Encounter Medications as of 06/26/2022  Medication Sig   cetirizine (ZYRTEC) 10 MG tablet Take 10 mg by mouth daily.   fluticasone (FLONASE) 50 MCG/ACT nasal spray Place into both nostrils daily.   ondansetron (ZOFRAN ODT) 4 MG disintegrating tablet Take 1 tablet (4 mg total) by mouth every 8 (eight) hours as needed for nausea or vomiting.   rizatriptan (MAXALT) 10 MG tablet Take 1 tablet (10 mg total) by mouth as needed for migraine. May repeat in 2 hours if needed   sertraline (ZOLOFT) 50 MG tablet Take 1.5 tablets (75 mg total) by mouth daily.   No facility-administered encounter medications on file as of 06/26/2022.    PAST MEDICAL HISTORY: Past Medical History:  Diagnosis Date   Anxiety    Headache    MRSA (methicillin resistant staph aureus) culture positive    5 or more years ago, right thumb    PAST SURGICAL HISTORY: Past Surgical History:  Procedure Laterality Date   ANKLE ARTHROSCOPY WITH RECONSTRUCTION Left 08/19/2021   Procedure: LEFT ANKLE ARTHROSCOPY, ARTHROSCOPIC TREATMENT OF TALUS OSTEOCHONDRAL LESION, LATERAL LIGAMENT RECONSTRUCTION, PERONEAL TENDON DEBRIDEMENT, REPAIR OF DISLOCATING PERONEAL TENDONS;  Surgeon: 08/21/2021, MD;  Location: [facility];  Service: Orthopedics;  Laterality: Left;  LENGTH OF SURGERY: 120 MINUTES   CESAREAN SECTION  2004 and 2008   x 2   DIAGNOSTIC LAPAROSCOPY     TONSILLECTOMY      ALLERGIES: Allergies  Allergen Reactions   Sumatriptan Nausea And Vomiting    Severe nausea and vomiting   Sulfa Antibiotics Rash    FAMILY HISTORY: Family History  Problem Relation Age of Onset   Hypertension Mother    Diabetes Mother    Osteoarthritis Mother    Fibromyalgia  Mother    Heart attack Father        Defibrillator    SOCIAL HISTORY: Social History   Tobacco Use   Smoking status: Never   Smokeless tobacco: Never  Vaping Use   Vaping Use: Never used  Substance Use Topics   Alcohol use: Yes    Comment: 1 a month or less   Drug use: No   Social History   Social History Narrative   Are you right handed or left handed? Right   Are you currently employed ? no      Do you live at home alone?husband and 2 daughters   Caffeine none   What type of home do you live in: 1 story or 2 story? One with stairs inside         OBJECTIVE: PHYSICAL EXAM: BP 112/72   Pulse 96   Ht 5\' 3"  (1.6 m)   Wt 190 lb (86.2 kg)   LMP 05/26/2022   SpO2 97%   BMI 33.66 kg/m   General: General appearance: Awake and alert. No distress. Cooperative with exam.  Skin: No obvious rash or jaundice. HEENT: Atraumatic. Anicteric. Psych: Affect appropriate.  Neurological: Mental Status: Alert. Speech fluent. No pseudobulbar affect Cranial Nerves: CNII: No RAPD. Visual fields grossly intact. CNIII, IV, VI: PERRL. No nystagmus. EOMI. CN V: Facial sensation intact bilaterally to fine touch. CN VII: Facial muscles symmetric and strong. No ptosis at rest. CN VIII: Hearing grossly intact bilaterally. CN IX: No hypophonia. CN X: Palate elevates symmetrically. CN XI: Full strength shoulder shrug bilaterally. CN XII: Tongue protrusion full and midline. No atrophy or fasciculations. No significant dysarthria Motor: Tone is normal. No atrophy.  Individual muscle group testing (MRC grade out of 5):  Movement     Neck flexion 5    Neck extension 5     Right Left   Shoulder abduction 5 5   Elbow flexion 5 5   Elbow extension 5 5   Finger abduction - FDI 5 5   Finger abduction - ADM 5 5   Finger extension 5 5   Finger distal flexion - 2/3 5 5    Finger distal flexion - 4/5 5 5    Thumb flexion - FPL 5 5    Hip flexion 5 5 Limited on left due to pain to  touch, but appears intact throughout  Hip extension 5 5   Hip adduction 5 5   Hip abduction 5 5   Knee extension 5 5   Knee flexion 5 5   Dorsiflexion 5 5   Plantarflexion 5 5   Inversion 5 5   Eversion 5 5   Great toe extension 5 5   Great toe flexion 5 5     Reflexes:  Right Left   Bicep 2+ 2+   Tricep 2+ 2+   BrRad 2+ 2+   Knee 2+ 2+   Ankle 2+ 2+    Pathological Reflexes: Babinski: flexor response bilaterally Hoffman: absent bilaterally Troemner: absent bilaterally Sensation: Extremely sensitive to any touch  of the leg from the thigh to the foot. Pinprick: Hyperalgesia in left foot. Otherwise intact. Vibration: Hyperalgesia in left foot. Otherwise intact. Proprioception: Intact in bilateral great toes. Coordination: Intact finger-to- nose-finger bilaterally. Romberg negative. Gait: Able to rise from chair with arms crossed unassisted. Narrow-based gait. Limp in left leg when walking.  Lab and Test Review: Internal labs: Normal or unremarkable: lipid panel, TSH, CMP, CBC, vit D HbA1c: 5.3  Outside EMG (04/06/22):    ASSESSMENT: SHERMAINE RIVET is a 41 y.o. female who presents for evaluation of pain in left lower extremity. She has a relevant medical history of migraines and generalized anxiety. Her neurological examination is pertinent for extreme sensitive to touch in left lower extremity and hyperalgesia in left lower extremity. Her strength and reflexes are intact though. Available diagnostic data is significant for EMG suggesting absent left lower extremity sensation. HbA1c was normal.   Patient's symptoms are most consistent with complex regional pain syndrome. This is supported by her out of proportion pain to even light touch of her left leg since surgery, reported color changes, reported swelling, and an expanding distribution beyond previous nerve block at knee. Her exam does not support clear nerve damage given intact strength, reflexes, but  hypersensitivity could suggest some sensory deficits. I will recheck her EMG to clarify findings and send patient to pain management for treatment.  PLAN: -EMG: LLE -Pain management for CPRS treatment  -Return to clinic as needed  The impression above as well as the plan as outlined below were extensively discussed with the patient (in the company of husband and daughter) who voiced understanding. All questions were answered to their satisfaction.  When available, results of the above investigations and possible further recommendations will be communicated to the patient via telephone/MyChart. Patient to call office if not contacted after expected testing turnaround time.   Total time spent reviewing records, interview, history/exam, documentation, and coordination of care on day of encounter:  65 min   Thank you for allowing me to participate in patient's care.  If I can answer any additional questions, I would be pleased to do so.  Jacquelyne Balint, MD   CC: Carlean Jews, NP [address] [place] [address]  CC: Referring provider: Terance Hart, MD [address] [place],  [place] [address]

## 2022-06-24 ENCOUNTER — Ambulatory Visit: Payer: 59 | Admitting: Orthopedic Surgery

## 2022-06-26 ENCOUNTER — Encounter: Payer: Self-pay | Admitting: Neurology

## 2022-06-26 ENCOUNTER — Ambulatory Visit: Payer: 59 | Admitting: Neurology

## 2022-06-26 VITALS — BP 112/72 | HR 96 | Ht 63.0 in | Wt 190.0 lb

## 2022-06-26 DIAGNOSIS — R209 Unspecified disturbances of skin sensation: Secondary | ICD-10-CM

## 2022-06-26 DIAGNOSIS — G90522 Complex regional pain syndrome I of left lower limb: Secondary | ICD-10-CM | POA: Diagnosis not present

## 2022-06-26 DIAGNOSIS — R269 Unspecified abnormalities of gait and mobility: Secondary | ICD-10-CM | POA: Diagnosis not present

## 2022-06-26 NOTE — Patient Instructions (Signed)
I think your symptoms are most consistent with complex regional pain syndrome. I am providing your information about this today.  I want to repeat your EMG to confirm findings previously found as they do not match your story or exam.  I am referring you to pain management as they treat complex regional pain syndrome.  The physicians and staff at [facility] are committed to providing excellent care. You may receive a survey requesting feedback about your experience at our office. We strive to receive "very good" responses to the survey questions. If you feel that your experience would prevent you from giving the office a "very good " response, please contact our office to try to remedy the situation. We may be reached at [phone]. Thank you for taking the time out of your busy day to complete the survey.  Kai Levins, MD [facility]

## 2022-06-29 ENCOUNTER — Telehealth: Payer: Self-pay | Admitting: Anesthesiology

## 2022-06-29 ENCOUNTER — Ambulatory Visit (INDEPENDENT_AMBULATORY_CARE_PROVIDER_SITE_OTHER): Payer: 59 | Admitting: Orthopedic Surgery

## 2022-06-29 ENCOUNTER — Encounter: Payer: Self-pay | Admitting: Orthopedic Surgery

## 2022-06-29 ENCOUNTER — Telehealth: Payer: Self-pay | Admitting: Neurology

## 2022-06-29 DIAGNOSIS — G90522 Complex regional pain syndrome I of left lower limb: Secondary | ICD-10-CM | POA: Diagnosis not present

## 2022-06-29 NOTE — Telephone Encounter (Signed)
Patient was seen on 11/3. She has questions regarding her visit with Dr Berdine Addison. She did not want ot go into detail. Requests a call back.

## 2022-06-29 NOTE — Progress Notes (Signed)
Office Visit Note   Patient: Caitlin Bryant           Date of Birth: Jun 13, 1981           MRN: [id number] Visit Date: 06/29/2022 Requested by: Carlean Jews, NP [address] [place],  [place] [address] PCP: Carlean Jews, NP  Subjective: Chief Complaint  Patient presents with   Left Ankle - Pain    HPI: Caitlin Bryant is a 41 y.o. female who presents to the office reporting left ankle pain.  About 50 pages of notes are reviewed as part of this clinic visit.  Patient had surgery 08/19/2021 by a foot and ankle specialist for left ankle instability and degenerative changes.  She had left ankle arthroscopy with peroneal tendon debridement lateral ligament reconstruction and repair of dislocating tendons.  She underwent a popliteal block for this procedure.  She had significant pain and numbness in the foot after the procedure and specifically after the block wore off.  I reviewed photos of her foot from immediately in the postsurgical period and there does not appear to be any significant color difference outside of what is expected in the postop period she has had persistent foot and leg pain.  She has undergone multiple multiple courses of physical therapy.  She states that the pain which started in her foot and ankle has started to rise up into the mid thigh region.  Denies much in the way of back pain.  It is difficult for her to "feel the weight on her foot while standing".  She also reports that her foot is drifting inward at rest.  She has had an EMG nerve study performed which shows possible L4 radiculopathy or left sural superficial peroneal and saphenous absent sensory nerve firing.  She states that the ankle feels tight at times.  She has gained some weight as a result of diminished activity from her ankle problems.  She is nearly a year out from surgery.  She is scheduled for a second EMG nerve conduction study in mid December.              ROS: All systems  reviewed are negative as they relate to the chief complaint within the history of present illness.  Patient denies fevers or chills.  Assessment & Plan: Visit Diagnoses:  1. Complex regional pain syndrome type 1 of left lower extremity     Plan: Impression is difficult problem in the left ankle.  The question at hand is really whether or not her nerve problem stems from complications from a popliteal block or if this is complications from portal placement around the ankle and subsequent open surgery around the ankle.  On exam she does have differing sensations on the plantar aspect of the foot which would lean more towards a problem with the block.  I have seen CRPS in the lower extremity and this may be a mild form of that problem.  The exam findings today most consistent with that his a faint discoloration of the ankle around the sock line distally just above the malleoli.  There is no real temperature difference left foot versus right foot.  The sensory difference is significant.  Recommendation at this time would be to see what the nerve study shows in terms of differentiating whether or not the nerve problem is arising across the knee or across the ankle.  If CRPS is suspected which I think it is in this case then  referral to pain specialist with experience in epidural blocks for the sympathetic nervous system is indicated.  I have seen this evolve to require spinal cord stimulation as well.  She will follow-up as needed.  Follow-Up Instructions: No follow-ups on file.   Orders:  No orders of the defined types were placed in this encounter.  No orders of the defined types were placed in this encounter.     Procedures: No procedures performed   Clinical Data: No additional findings.  Objective: Vital Signs: LMP 05/26/2022   Physical Exam:  Constitutional: Patient appears well-developed HEENT:  Head: Normocephalic Eyes:EOM are normal Neck: Normal range of motion Cardiovascular:  Normal rate Pulmonary/chest: Effort normal Neurologic: Patient is alert Skin: Skin is warm Psychiatric: Patient has normal mood and affect  Ortho Exam: Ortho exam demonstrates antalgic gait to the left.  She does have faint discoloration in a sock pattern just above the lateral I involving the foot.  No significant temperature difference left foot versus right foot.  Pedal pulses palpable.  Sensation diminished most significantly on the dorsal aspect of the foot but also to a lesser degree on the plantar aspect of the foot.  Eversion strength is 5- out of 5 on the left compared to 5+ out of 5 on the right.  Ankle dorsiflexion plantarflexion and inversion strength 5+ out of 5 bilaterally.  Ankle feels reasonably stable to anterior drawer testing and varus tilt testing left versus right.  Peroneal tendons do not subluxate.  Specialty Comments:  No specialty comments available.  Imaging: No results found.   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   Cutaneous candidiasis 05/24/2022   BMI 33.0-33.9,adult 09/28/2021   Left foot pain 09/28/2021   Overweight with body mass index (BMI) 25.0-29.9 06/25/2021   Acute right-sided low back pain with right-sided sciatica 06/15/2021   Hip pain 06/15/2021   Disorder of lumbosacral intervertebral disc 06/15/2021   Chronic pain of left ankle 06/08/2021   Routine general medical examination at a health care facility 03/17/2021   Encounter to establish care 02/23/2021   Other fatigue 02/23/2021   Viral upper respiratory infection 12/04/2020   Acute bilateral low back pain without sciatica 06/09/2019   Sore throat 06/09/2018   Left knee pain 04/20/2018   Impacted cerumen of right ear 04/15/2017   Acute non-recurrent pansinusitis 10/09/2016   Neck swelling 08/07/2016   Mouth sores 07/24/2015   Depression 07/24/2015    TMJ dysfunction 07/03/2015   Acute confusional migraine 05/10/2015   Ankle instability, left 02/22/2015   Generalized anxiety disorder 01/31/2015   Allergic rhinitis 01/31/2015   Past Medical History:  Diagnosis Date   Anxiety    Headache    MRSA (methicillin resistant staph aureus) culture positive    5 or more years ago, right thumb    Family History  Problem Relation Age of Onset   Hypertension Mother    Diabetes Mother    Osteoarthritis Mother    Fibromyalgia Mother    Heart attack Father        Defibrillator    Past Surgical History:  Procedure Laterality Date   ANKLE ARTHROSCOPY WITH RECONSTRUCTION Left 08/19/2021   Procedure: LEFT ANKLE ARTHROSCOPY, ARTHROSCOPIC TREATMENT OF TALUS OSTEOCHONDRAL LESION, LATERAL LIGAMENT RECONSTRUCTION, PERONEAL TENDON DEBRIDEMENT, REPAIR OF DISLOCATING PERONEAL TENDONS;  Surgeon: Erle Crocker, MD;  Location: [facility];  Service: Orthopedics;  Laterality: Left;  LENGTH OF SURGERY: 120 MINUTES   CESAREAN SECTION  2004 and 2008   x 2   DIAGNOSTIC LAPAROSCOPY     TONSILLECTOMY     Social History   Occupational History   Not on file  Tobacco Use   Smoking status: Never   Smokeless tobacco: Never  Vaping Use   Vaping Use: Never used  Substance and Sexual Activity   Alcohol use: Yes    Comment: 1 a month or less   Drug use: No   Sexual activity: Yes    Partners: Male

## 2022-06-29 NOTE — Telephone Encounter (Signed)
Called patient back to discuss her questions. She was interested in what stage of CPRS she was in. I told her while I am not certain, it was likely at least stage 2 if not stage 3.   She asked what she could be doing to help or not hurt/make things worse. I explained that she should stay as active as she is able.  She saw ortho for a 2nd opinion today. They want to see the updated EMG and agree this could be CPRS per patient.  She will have repeat EMG and see pain management as discussed at clinic visit.  All questions were answered.  Kai Levins, MD [facility]

## 2022-07-07 ENCOUNTER — Encounter: Payer: Self-pay | Admitting: Physical Medicine & Rehabilitation

## 2022-07-09 ENCOUNTER — Telehealth: Payer: Self-pay

## 2022-07-09 NOTE — Telephone Encounter (Signed)
Pt is calling for a refill on: rizatriptan (MAXALT) 10 MG tablet   Pharmacy: [facility] - [place], [place] - [address]   LOV 06/17/22 ROV 09/17/22  Pt is experiencing a headache currently

## 2022-07-17 ENCOUNTER — Other Ambulatory Visit: Payer: Self-pay | Admitting: Nurse Practitioner

## 2022-07-17 DIAGNOSIS — F411 Generalized anxiety disorder: Secondary | ICD-10-CM

## 2022-08-06 ENCOUNTER — Encounter: Payer: 59 | Admitting: Physical Medicine & Rehabilitation

## 2022-08-06 ENCOUNTER — Ambulatory Visit: Payer: 59 | Admitting: Nurse Practitioner

## 2022-08-10 ENCOUNTER — Ambulatory Visit: Payer: 59 | Admitting: Neurology

## 2022-08-10 ENCOUNTER — Telehealth: Payer: Self-pay | Admitting: Neurology

## 2022-08-10 DIAGNOSIS — R209 Unspecified disturbances of skin sensation: Secondary | ICD-10-CM

## 2022-08-10 DIAGNOSIS — G90522 Complex regional pain syndrome I of left lower limb: Secondary | ICD-10-CM | POA: Diagnosis not present

## 2022-08-10 DIAGNOSIS — R269 Unspecified abnormalities of gait and mobility: Secondary | ICD-10-CM

## 2022-08-10 NOTE — Procedures (Signed)
  [facility]  [address] [place], [address]  [place], [place] [address] Tel: [phone] Fax: [phone] Test Date:  08/10/2022  Patient: Caitlin Bryant DOB: 1981/02/24 Physician: Jacquelyne Balint, MD  Sex: Female Height: 5\' 3"  Ref Phys: , MD  ID#: Jacquelyne Balint   Technician:    History: This is a 41 year old female with left leg pain and numbness.  NCV & EMG Findings: Extensive electrodiagnostic evaluation of the left lower limb with additional nerve conduction studies of the right lower limb shows: Bilateral sural and superficial peroneal/fibular sensory responses are within normal limits. Left peroneal/fibular (EDB) and left tibial (AH) motor responses are within normal limits. Left H reflex latency is within normal limits. There is no evidence of active or chronic motor axon loss changes affecting any of the tested muscles. Motor unit configuration and recruitment pattern is within normal limits.  Impression: This is a normal study of the left lower limb. In particular, there is no electrodiagnostic evidence of a left lumbosacral (L3-S1) radiculopathy or large fiber sensorimotor neuropathy. Screening studies for a left peroneal/fibular or tibial mononeuropathy are normal.    ___________________________ 46, MD    Nerve Conduction Studies Motor Nerve Results    Latency Amplitude F-Lat Segment Distance CV Comment  Site (ms) Norm (mV) Norm (ms)  (cm) (m/s) Norm   Left Fibular (EDB) Motor  Ankle 4.2  < 5.5 4.1  > 3.0        Bel fib head 9.9 - 4.1 -  Bel fib head-Ankle 29 51  > 40   Pop fossa 11.4 - 4.1 -  Pop fossa-Bel fib head 8 53 -   Left Tibial (AH) Motor  Ankle 3.5  < 6.0 16.7  > 8.0        Knee 11.7 - 11.3 -  Knee-Ankle 35 43  > 40    Sensory Sites    Neg Peak Lat Amplitude (O-P) Segment Distance Velocity Comment  Site (ms) Norm (V) Norm  (cm) (ms)   Left Superficial Fibular Sensory  14 cm-Ankle 3.9  < 4.5 6  > 5 10 cm-Ankle 10     Right Superficial Fibular Sensory  14 cm-Ankle 2.8  < 4.5 7  > 5 10 cm-Ankle 10    Left Sural Sensory  Calf-Lat mall 3.2  < 4.5 7  > 5 Calf-Lat mall 12    Right Sural Sensory  Calf-Lat mall 3.4  < 4.5 11  > 5 Calf-Lat mall 12     H-Reflex Results    M-Lat H Lat H Neg Amp H-M Lat  Site (ms) (ms) Norm (mV) (ms)  Left Tibial H-Reflex  Pop fossa 6.1 31.0  < 35.0 3.5 24.9   Electromyography   Side Muscle Ins.Act Fibs Fasc Recrt Amp Dur Poly Activation Comment  Left Tib ant Nml Nml Nml Nml Nml Nml Nml Nml N/A  Left Gastroc MH Nml Nml Nml Nml Nml Nml Nml Nml N/A  Left Rectus fem Nml Nml Nml Nml Nml Nml Nml Nml N/A  Left Biceps fem SH Nml Nml Nml Nml Nml Nml Nml Nml N/A  Left Gluteus med Nml Nml Nml Nml Nml Nml Nml Nml N/A      Waveforms:  Motor      Sensory           H-Reflex

## 2022-08-10 NOTE — Telephone Encounter (Signed)
Discussed the results of patient's EMG after the procedure today. EMG today was normal. Unlike previous outside EMG, I was able to obtain all sensory responses. There is no current evidence of nerve damage causing worsening left leg pain. Patient will be seeing pain management next month for presumed CRPS.  All questions were answered.  Jacquelyne Balint, MD [facility]

## 2022-08-11 ENCOUNTER — Telehealth: Payer: Self-pay | Admitting: Neurology

## 2022-08-11 NOTE — Telephone Encounter (Signed)
Patient called and states that she does not see her results in the Mychart and she wants to know the results of the EMG  please call her with the results

## 2022-08-12 NOTE — Telephone Encounter (Signed)
Pt called an informed that her results of EMG are normal she wants to know if they will be released on mychart so she can print them off?

## 2022-08-18 ENCOUNTER — Other Ambulatory Visit: Payer: Self-pay | Admitting: Nurse Practitioner

## 2022-08-18 DIAGNOSIS — F411 Generalized anxiety disorder: Secondary | ICD-10-CM

## 2022-08-31 ENCOUNTER — Encounter: Payer: 59 | Attending: Physical Medicine & Rehabilitation | Admitting: Physical Medicine & Rehabilitation

## 2022-08-31 ENCOUNTER — Encounter: Payer: Self-pay | Admitting: Physical Medicine & Rehabilitation

## 2022-08-31 VITALS — BP 112/79 | HR 79 | Ht 63.0 in | Wt 194.0 lb

## 2022-08-31 DIAGNOSIS — G90522 Complex regional pain syndrome I of left lower limb: Secondary | ICD-10-CM | POA: Insufficient documentation

## 2022-08-31 DIAGNOSIS — M25572 Pain in left ankle and joints of left foot: Secondary | ICD-10-CM | POA: Insufficient documentation

## 2022-08-31 MED ORDER — PREGABALIN 50 MG PO CAPS: 50.0000 mg | ORAL_CAPSULE | Freq: Two times a day (BID) | ORAL | 2 refills | Status: DC

## 2022-08-31 NOTE — Progress Notes (Signed)
Subjective:    Patient ID: Caitlin Bryant, female    DOB: Nov 14, 1980, 42 y.o.   MRN: [id number]  HPI Caitlin Bryant is a 42 y.o. year old female  who  has a past medical history of Anxiety, Headache, and MRSA (methicillin resistant staph aureus) culture positive.   They are presenting to PM&R clinic as a new patient for pain management evaluation. They were referred for treatment of chronic pain. She reports her pain started after her ankle tendon surgery Aug 19, 2021. Since this time has has had severe pain in her left lower extremity  Red flag symptoms: Patient denies saddle anesthesia, loss of bowel or bladder continence, new weakness, new numbness/tingling, or pain waking up at nighttime.  Medications tried: Gabapentin -unsure dose, only able to take HS due to sedation, liminal benefit for pain Ibuprofen 200-400mg  2-3 times a day  Aug 19 2021, ankle suergery to tighten tendons Left ankle Swelling  Showering makes it really irritated  Gabapentin- sleeping ,doenst help pain- at night  Activity decreased - wegiht gain , walk short distances only Getting worse Numb an dtingling bottom toes  Alt Lateral ankle scar    Pain Inventory Average Pain 9 Pain Right Now 5 My pain is sharp, burning, dull, stabbing, tingling, and aching  In the last 24 hours, has pain interfered with the following? General activity 8 Relation with others 8 Enjoyment of life 10 What TIME of day is your pain at its worst? morning , daytime, evening, and night Sleep (in general) Poor  Pain is worse with: walking, bending, sitting, inactivity, standing, and some activites Pain improves with:  . Relief from Meds: 0  walk without assistance ability to climb steps?  yes do you drive?  yes  not employed: date last employed 03/2020 I need assistance with the following:  household duties and shopping  weakness numbness tingling trouble walking dizziness anxiety  Any changes since last  visit?  no  Any changes since last visit?  no    Family History  Problem Relation Age of Onset  . Hypertension Mother   . Diabetes Mother   . Osteoarthritis Mother   . Fibromyalgia Mother   . Heart attack Father        Defibrillator   Social History   Socioeconomic History  . Marital status: Married    Spouse name: Not on file  . Number of children: Not on file  . Years of education: Not on file  . Highest education level: Not on file  Occupational History  . Not on file  Tobacco Use  . Smoking status: Never  . Smokeless tobacco: Never  Vaping Use  . Vaping Use: Never used  Substance and Sexual Activity  . Alcohol use: Yes    Comment: 1 a month or less  . Drug use: No  . Sexual activity: Yes    Partners: Male  Other Topics Concern  . Not on file  Social History Narrative   Are you right handed or left handed? Right   Are you currently employed ? no      Do you live at home alone?husband and 2 daughters   Caffeine none   What type of home do you live in: 1 story or 2 story? One with stairs inside       Social Determinants of Health   Financial Resource Strain: Not on file  Food Insecurity: Not on file  Transportation Needs: Not on file  Physical Activity: Not  on file  Stress: Not on file  Social Connections: Not on file   Past Surgical History:  Procedure Laterality Date  . ANKLE ARTHROSCOPY WITH RECONSTRUCTION Left 08/19/2021   Procedure: LEFT ANKLE ARTHROSCOPY, ARTHROSCOPIC TREATMENT OF TALUS OSTEOCHONDRAL LESION, LATERAL LIGAMENT RECONSTRUCTION, PERONEAL TENDON DEBRIDEMENT, REPAIR OF DISLOCATING PERONEAL TENDONS;  Surgeon: Terance Hart, MD;  [facility]:  [facility];  Service: Orthopedics;  Laterality: Left;  LENGTH OF SURGERY: 120 MINUTES  . CESAREAN SECTION  2004 and 2008   x 2  . DIAGNOSTIC LAPAROSCOPY    . TONSILLECTOMY     Past Medical History:  Diagnosis Date  . Anxiety   . Headache   . MRSA (methicillin resistant staph  aureus) culture positive    5 or more years ago, right thumb   BP 112/79   Pulse 79   Ht 5\' 3"  (1.6 m)   Wt 194 lb (88 kg)   SpO2 96%   BMI 34.37 kg/m   Opioid Risk Score:   Fall Risk Score:  `1  Depression screen [facility] 2/9     08/31/2022    3:09 PM 06/17/2022   10:17 AM 05/06/2022   10:37 AM 03/24/2022   10:17 AM 07/07/2021   11:04 AM 06/12/2021    4:09 PM 05/30/2021    8:47 AM  Depression screen PHQ 2/9  Decreased Interest 3 1 1 1 1  0 1  Down, Depressed, Hopeless 2 0 1 0 0 0 0  PHQ - 2 Score 5 1 2 1 1  0 1  Altered sleeping 3 0 0 1 1 0 1  Tired, decreased energy 2 1 1 1 1 1 1   Change in appetite 2 1 1 1 1 1 1   Feeling bad or failure about yourself  0 0 1 0 0 0 0  Trouble concentrating 0 1 1 1 1 1 1   Moving slowly or fidgety/restless 0 0 1 0 0 1 0  Suicidal thoughts 0 0 0 0 0 0 0  PHQ-9 Score 12 4 7 5 5 4 5   Difficult doing work/chores Very difficult   Not difficult at all         Review of Systems  Musculoskeletal:  Positive for gait problem.  All other systems reviewed and are negative.     Objective:   Physical Exam  Gen: no distress, normal appearing HEENT: oral mucosa pink and moist, NCAT Cardio: Reg rate Chest: normal effort, normal rate of breathing Abd: soft, non-distended Psych: pleasant, normal affect Skin: intact Neuro: CN 2-12 grossly intact, follows commands,  Strength 5/5 in b/l UE Strength RLE 5/5 throughout Strength RLE at least 4/5 limited by pain DTR normal and symmetric  No dysmetria NO ankle clonus b/l Narrow based gait Musculoskeletal: Allodynia LLE starting lower thigh and below  Pain with movement of L ankle in all directions, pain with movement L knee  Pain with varus and valgus stress L  knee  Swelling RUE knee and distal       Assessment & Plan:   LLE pain after L ankle tendon surgery 2022 -Normal EMG LLE 08/10/22 -Agree with neurology, suspect CRPS type 1 -Continue Ibuprofen  -Discontinue gabapentin -Start lyrica - 50mg   BID  -Consult for PT placed

## 2022-09-08 ENCOUNTER — Telehealth: Payer: Self-pay | Admitting: Physical Medicine & Rehabilitation

## 2022-09-08 NOTE — Telephone Encounter (Signed)
PA submitted through cover my meds (Key: [id number])

## 2022-09-08 NOTE — Telephone Encounter (Signed)
Patient needs prior auth for Lyrica.

## 2022-09-10 NOTE — Telephone Encounter (Signed)
PA Approved for pregabalin valid 09/08/22-09/09/23

## 2022-09-14 ENCOUNTER — Other Ambulatory Visit: Payer: Self-pay | Admitting: Nurse Practitioner

## 2022-09-14 DIAGNOSIS — F411 Generalized anxiety disorder: Secondary | ICD-10-CM

## 2022-09-16 NOTE — Therapy (Signed)
OUTPATIENT PHYSICAL THERAPY LOWER EXTREMITY EVALUATION   Patient Name: Caitlin Bryant MRN: [id number] DOB:[birth year], 42 y.o., female Today's Date: 09/17/2022  END OF SESSION:  PT End of Session - 09/17/22 1144     Visit Number 1    Number of Visits 13    Date for PT Re-Evaluation 11/12/22    Authorization Type [employer]    Authorization - Visit Number 1    Authorization - Number of Visits 30   VL   PT Start Time 5462    PT Stop Time 1248    PT Time Calculation (min) 63 min    Activity Tolerance Patient limited by pain    Behavior During Therapy WFL for tasks assessed/performed             Past Medical History:  Diagnosis Date   Anxiety    Headache    MRSA (methicillin resistant staph aureus) culture positive    5 or more years ago, right thumb   Past Surgical History:  Procedure Laterality Date   ANKLE ARTHROSCOPY WITH RECONSTRUCTION Left 08/19/2021   Procedure: LEFT ANKLE ARTHROSCOPY, ARTHROSCOPIC TREATMENT OF TALUS OSTEOCHONDRAL LESION, LATERAL LIGAMENT RECONSTRUCTION, PERONEAL TENDON DEBRIDEMENT, REPAIR OF DISLOCATING PERONEAL TENDONS;  Surgeon: Erle Crocker, MD;  Location: [place];  Service: Orthopedics;  Laterality: Left;  LENGTH OF SURGERY: 120 MINUTES   CESAREAN SECTION  2004 and 2008   x 2   DIAGNOSTIC LAPAROSCOPY     TONSILLECTOMY     Patient Active Problem List   Diagnosis Date Noted   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 05/24/2022   Left peroneal nerve injury 05/24/2022   Cutaneous candidiasis 05/24/2022   BMI 33.0-33.9,adult 09/28/2021   Left foot pain 09/28/2021   Overweight with body mass index (BMI) 25.0-29.9 06/25/2021   Acute right-sided low back pain with right-sided sciatica 06/15/2021   Hip pain 06/15/2021   Disorder of lumbosacral intervertebral disc 06/15/2021   Chronic pain of left ankle 06/08/2021   Routine general medical examination at a health care facility 03/17/2021   Encounter to  establish care 02/23/2021   Other fatigue 02/23/2021   Viral upper respiratory infection 12/04/2020   Acute bilateral low back pain without sciatica 06/09/2019   Sore throat 06/09/2018   Left knee pain 04/20/2018   Impacted cerumen of right ear 04/15/2017   Acute non-recurrent pansinusitis 10/09/2016   Neck swelling 08/07/2016   Mouth sores 07/24/2015   Depression 07/24/2015   TMJ dysfunction 07/03/2015   Acute confusional migraine 05/10/2015   Ankle instability, left 02/22/2015   Generalized anxiety disorder 01/31/2015   Allergic rhinitis 01/31/2015    PCP: Ronnell Freshwater, NP  REFERRING PROVIDER: Jennye Boroughs, MD  REFERRING DIAG: G90.522 (ICD-10-CM) - Complex regional pain syndrome type 1 of left lower extremity  THERAPY DIAG:  Pain in left ankle and joints of left foot  Other abnormalities of gait and mobility  Paresthesia of skin  Rationale for Evaluation and Treatment: Rehabilitation  ONSET DATE: December 2022 ankle scope   SUBJECTIVE:   SUBJECTIVE STATEMENT: Pt reports chronic ankle instability and pain which culminated to ankle surgery Dec 2022. 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 Reports that after surgery she began to develop sensory issues and pain in her toes although ankle recovery itself seemed to go well. Pt progressed through PT but continued to have pain and swelling that progressed up lower limb, now reaching up to hip. Pt describes allodynia (  pain with blankets, water in shower, socks) and occasional skin color changes when irritated (blotchy). States she has seen multiple providers, received EMG and imaging. States in autumn 2023 providers began to suspect CRPS. Pt reports reduced activity levels as symptoms progressed, has frequent losses of balance. Feels she has developed some compensatory symptoms in RLE.   PERTINENT HISTORY: multiple MSK issues, migraines, anxiety PAIN:  Are you having pain: yes 4/10  (averages 4-5/10) Location: L hip, knee, ankle, foot, and toes How would you describe your pain? Burning, shooting, tingling Best in past week: 4/10 Worst in past week: 8/10 Aggravating factors: WB, movement, touch, difficulty sleeping due to positioning  Easing factors: positioning (especially dependent positioning)   PRECAUTIONS: None  WEIGHT BEARING RESTRICTIONS: No  FALLS:  Has patient fallen in last 6 months? Frequent near falls   LIVING ENVIRONMENT: 1 story home, ramped entrance. 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 Pt does inquire about whether disability would be prudent - advised to discuss with MD   PLOF: Independent - previous ankle instability with falls using ankle brace  PATIENT GOALS: reduce pain, get more active  NEXT MD VISIT: TBD  OBJECTIVE:   DIAGNOSTIC FINDINGS:  Pre surgical MRI showing damaged cartilage and loosened ligaments, pt reports multiple EMGs and other imaging since surgery. Unable to view these in epic. Ankle scope Dec 2022. Per referral pt with suspected CRPS.   PATIENT SURVEYS:  FOTO 34% current, 56% predicted  COGNITION: Overall cognitive status: Within functional limits for tasks assessed     SENSATION: Globally reduced sensation distal to knee - tingling to light touch dorsal L foot, hallux, and lateral foot With rough towel, increased tingling lateral foot and dorsal foot compared to light touch Increased burning medial calf and toes after sensation testing  EDEMA:  Mild edema throughout L foot dorsally and achilles tendon   PALPATION: TTP throughout ankle, foot, and lower limb globally   LOWER EXTREMITY ROM:   Active ROM Right eval Left eval  Knee flexion    Knee extension    Ankle dorsiflexion 10 2  Ankle plantarflexion  46  Ankle inversion 50 32  Ankle eversion 35 25    (Blank rows = not tested) (Key: WFL = within functional limits not formally assessed, * = concordant pain, s = stiffness/stretching sensation, NT = not tested)  Comments:    LOWER EXTREMITY MMT:  MMT Right eval Left eval  Knee flexion    Knee extension    Ankle dorsiflexion    Ankle plantarflexion    Ankle inversion    Ankle eversion     (Blank rows = not tested) Comments:    (Blank rows = not tested) (Key: WFL = within functional limits not formally assessed, * = concordant pain, s = stiffness/stretching sensation, NT = not tested)  Comments:     FUNCTIONAL TESTS:  Sit to stand: B UE support from standard surface, inc WB through RLE   GAIT: Distance walked: within clinic  Assistive device utilized: None Level of assistance: Complete Independence Comments: antalgic gait LLE, reduced truncal rotation B, partial step through pattern leading with RLE   TODAY'S TREATMENT:  [facility] Adult PT Treatment:                                                DATE: 09/17/22 Therapeutic Activity: Discussion re: activity modification, symptom monitoring and response to activity, introductory PNE as it pertains to activity and symptom behavior  PATIENT EDUCATION:  Education details: Pt education on PT impairments, prognosis, and POC. Informed consent. Rationale for interventions, introductory PNE Person educated: Patient Education method: Explanation, Demonstration, Tactile cues, Verbal cues Education comprehension: verbalized understanding, returned demonstration, verbal cues required, tactile cues required, and needs further education    HOME EXERCISE PROGRAM: Deferred on this date due to time constraints in favor of inc time with pt education  ASSESSMENT:  CLINICAL IMPRESSION: Patient is a pleasant 42 y.o. woman who was seen today for physical therapy evaluation and  treatment for L LE pain suspected to be attributable to CRPS per referral. Pt endorses ankle surgery in 2022 for ankle instability/pain, previously fairly active, was working in a daycare. Reports gradual reduction in mobility with increase in pain and sensory changes, swelling in LLE that began at toes and now has progressed up to hip. On exam pt demonstrates mild mobility limitations on L ankle compared to R (the latter of which has significant ROM), as well as altered sensation. No overt pain during sensory exam but afterwards pt reports burning along medial calf and toes. Significant time spent w/ pt discussing relevant anatomy/physiology, graded exposure, introductory PNE. No adverse events. Recommend skilled PT with aim of addressing aforementioned deficits to maximize functional independence/tolerance. Pt departs today's session in no acute distress, all voiced questions/concerns addressed appropriately from PT perspective.    OBJECTIVE IMPAIRMENTS: Abnormal gait, decreased activity tolerance, decreased balance, decreased endurance, decreased mobility, difficulty walking, decreased ROM, decreased strength, hypomobility, increased edema, impaired perceived functional ability, impaired sensation, postural dysfunction, and pain.   ACTIVITY LIMITATIONS: carrying, lifting, bending, sitting, standing, squatting, sleeping, stairs, transfers, and locomotion level  PARTICIPATION LIMITATIONS: meal prep, cleaning, laundry, driving, shopping, community activity, and occupation  PERSONAL FACTORS: Time since onset of injury/illness/exacerbation and 1 comorbidity: anxiety/depression  are also affecting patient's functional outcome.   REHAB POTENTIAL: Fair given anatomy/physiology of condition and comorbidity  CLINICAL DECISION MAKING: Evolving/moderate complexity  EVALUATION COMPLEXITY: Moderate   GOALS: Goals reviewed with patient? No  SHORT TERM GOALS: Target date: 10/15/2022    Pt will demonstrate  appropriate understanding and performance of initially prescribed HEP in order to facilitate improved independence with management of symptoms.  Baseline: HEP provided on eval Goal status: INITIAL   2. Pt will score greater than or equal to 44% on FOTO in order to demonstrate improved perception of function due to symptoms.  Baseline: 34%  Goal status: INITIAL     LONG TERM GOALS: Target date: 11/12/2022   Pt will score 56% or greater on FOTO in order to demonstrate improved perception of function due to symptoms.  Baseline: 34% Goal status: INITIAL  2.  Pt will demonstrate at least 10 degrees of L ankle DF AROM in order to facilitate improved tolerance to functional movements such as transfers/gait.  Baseline: see ROM chart above Goal status: INITIAL  3.  Pt will be able to perform floor transfer w/ less than 2pt increase in resting pain in order to promote increased tolerance to work activities.  Baseline: unable to perform  Goal status: INITIAL  4.  Pt will be able to perform sit to stand transfer without UE support and less than 2pt increase in resting pain to improve safety w/ transfers.  Baseline: B UE support and altered mechanics Goal status: INITIAL    PLAN:  PT FREQUENCY: 2x/week  PT DURATION: 8 weeks  PLANNED INTERVENTIONS: Therapeutic exercises, Therapeutic activity, Neuromuscular re-education, Balance training, Gait training, Patient/Family education, Self Care, Joint mobilization, Stair training, Aquatic Therapy, Electrical stimulation, Cryotherapy, Moist heat, Taping, Manual therapy, and Re-evaluation  PLAN FOR NEXT SESSION: establish HEP. Sensory training, comfortable ROM. Consider aquatic therapy if pt interested   Ashley Murrain PT, DPT 09/17/2022 1:19 PM

## 2022-09-17 ENCOUNTER — Other Ambulatory Visit: Payer: Self-pay

## 2022-09-17 ENCOUNTER — Encounter: Payer: Self-pay | Admitting: Physical Therapy

## 2022-09-17 ENCOUNTER — Encounter: Payer: Self-pay | Admitting: Nurse Practitioner

## 2022-09-17 ENCOUNTER — Ambulatory Visit: Payer: 59 | Admitting: Nurse Practitioner

## 2022-09-17 ENCOUNTER — Telehealth: Payer: Self-pay

## 2022-09-17 ENCOUNTER — Ambulatory Visit: Payer: 59 | Attending: Physical Medicine & Rehabilitation | Admitting: Physical Therapy

## 2022-09-17 ENCOUNTER — Telehealth: Payer: Self-pay | Admitting: *Deleted

## 2022-09-17 VITALS — BP 115/78 | HR 90 | Ht 63.0 in | Wt 194.4 lb

## 2022-09-17 DIAGNOSIS — R202 Paresthesia of skin: Secondary | ICD-10-CM | POA: Insufficient documentation

## 2022-09-17 DIAGNOSIS — R2689 Other abnormalities of gait and mobility: Secondary | ICD-10-CM | POA: Diagnosis not present

## 2022-09-17 DIAGNOSIS — G90522 Complex regional pain syndrome I of left lower limb: Secondary | ICD-10-CM | POA: Insufficient documentation

## 2022-09-17 DIAGNOSIS — M25572 Pain in left ankle and joints of left foot: Secondary | ICD-10-CM | POA: Diagnosis not present

## 2022-09-17 DIAGNOSIS — F411 Generalized anxiety disorder: Secondary | ICD-10-CM

## 2022-09-17 DIAGNOSIS — G43909 Migraine, unspecified, not intractable, without status migrainosus: Secondary | ICD-10-CM | POA: Diagnosis not present

## 2022-09-17 DIAGNOSIS — S8412XA Injury of peroneal nerve at lower leg level, left leg, initial encounter: Secondary | ICD-10-CM

## 2022-09-17 DIAGNOSIS — R69 Illness, unspecified: Secondary | ICD-10-CM | POA: Diagnosis not present

## 2022-09-17 DIAGNOSIS — Z6834 Body mass index (BMI) 34.0-34.9, adult: Secondary | ICD-10-CM

## 2022-09-17 MED ORDER — SERTRALINE HCL 100 MG PO TABS: 100.0000 mg | ORAL_TABLET | Freq: Every day | ORAL | 3 refills | Status: DC

## 2022-09-17 MED ORDER — RIZATRIPTAN BENZOATE 10 MG PO TABS: 10.0000 mg | ORAL_TABLET | ORAL | 3 refills | Status: DC | PRN

## 2022-09-17 NOTE — Progress Notes (Signed)
Established patient visit   Patient: Caitlin Bryant   DOB: 1980/09/29   42 y.o. Female  MRN: [id number] Visit Date: 09/17/2022   Chief Complaint  Patient presents with   Follow-up   Subjective    HPI  Follow up  -mood --moments of increased irritability.  --having increased personal stress.  -currently on sertraline 75 mg daily.  Does have migraine headache.  -takes maxalt for acute headaches  -starts physical therapy for her left foot pain which has been chronic -seeing pain management provider for her left foot. Had nerve damage after surgery.  -She denies chest pain, chest pressure, or shortness of breath. She denies headaches or visual disturbances. She denies abdominal pain, nausea, vomiting, or changes in bowel or bladder habits.     Medications: Outpatient Medications Prior to Visit  Medication Sig   cetirizine (ZYRTEC) 10 MG tablet Take 10 mg by mouth daily.   fluticasone (FLONASE) 50 MCG/ACT nasal spray Place into both nostrils daily.   [name] 1/20 1-20 MG-MCG tablet Take 1 tablet by mouth daily.   ondansetron (ZOFRAN ODT) 4 MG disintegrating tablet Take 1 tablet (4 mg total) by mouth every 8 (eight) hours as needed for nausea or vomiting.   [DISCONTINUED] pregabalin (LYRICA) 50 MG capsule Take 1 capsule (50 mg total) by mouth 2 (two) times daily.   [DISCONTINUED] rizatriptan (MAXALT) 10 MG tablet Take 1 tablet (10 mg total) by mouth as needed for migraine. May repeat in 2 hours if needed   [DISCONTINUED] sertraline (ZOLOFT) 25 MG tablet Take 50 mg by mouth daily.   [DISCONTINUED] sertraline (ZOLOFT) 50 MG tablet TAKE 1 TABLET BY MOUTH DAILY   No facility-administered medications prior to visit.    Review of Systems  Constitutional:  Negative for activity change, appetite change, chills, fatigue and fever.  HENT:  Negative for congestion, postnasal drip, rhinorrhea, sinus pressure, sinus pain, sneezing and sore throat.   Eyes: Negative.   Respiratory:   Negative for cough, chest tightness, shortness of breath and wheezing.   Cardiovascular:  Negative for chest pain and palpitations.  Gastrointestinal:  Negative for abdominal pain, constipation, diarrhea, nausea and vomiting.  Endocrine: Negative for cold intolerance, heat intolerance, polydipsia and polyuria.  Genitourinary:  Negative for dyspareunia, dysuria, flank pain, frequency and urgency.  Musculoskeletal:  Positive for arthralgias and myalgias. Negative for back pain.        Left foot weakness and decreased sensation.  Seeing pain management provider for her foot. Taking Lyrica 50 mg, and starts physical therapy today.   Skin:  Negative for rash.  Allergic/Immunologic: Negative for environmental allergies.  Neurological:  Positive for headaches. Negative for dizziness and weakness.  Hematological:  Negative for adenopathy.  Psychiatric/Behavioral:  Positive for dysphoric mood. The patient is nervous/anxious.        Increased personal stress.     Last CBC Lab Results  Component Value Date   WBC 6.5 03/23/2022   HGB 13.6 03/23/2022   HCT 41.2 03/23/2022   MCV 86 03/23/2022   MCH 28.4 03/23/2022   RDW 12.7 03/23/2022   PLT 224 [id number]   Last metabolic panel Lab Results  Component Value Date   GLUCOSE 96 03/23/2022   NA 140 03/23/2022   K 4.1 03/23/2022   CL 105 03/23/2022   CO2 21 03/23/2022   BUN 10 03/23/2022   CREATININE 0.70 03/23/2022   EGFR 111 03/23/2022   CALCIUM 9.1 03/23/2022   PROT 7.3 03/23/2022   ALBUMIN 4.4 03/23/2022  LABGLOB 2.9 03/23/2022   AGRATIO 1.5 03/23/2022   BILITOT 0.4 03/23/2022   ALKPHOS 79 03/23/2022   AST 13 03/23/2022   ALT 6 03/23/2022   Last lipids Lab Results  Component Value Date   CHOL 161 03/23/2022   HDL 49 03/23/2022   LDLCALC 91 03/23/2022   TRIG 117 03/23/2022   CHOLHDL 3.3 03/23/2022   Last hemoglobin A1c Lab Results  Component Value Date   HGBA1C 5.3 03/23/2022   Last thyroid functions Lab Results   Component Value Date   TSH 1.120 03/23/2022   Last vitamin D Lab Results  Component Value Date   VD25OH 42.9 03/13/2021       Objective     Today's Vitals   09/17/22 0913  BP: 115/78  Pulse: 90  SpO2: 98%  Weight: 194 lb 6.4 oz (88.2 kg)  Height: '5\' 3"'$  (1.6 m)   Body mass index is 34.44 kg/m.  BP Readings from Last 3 Encounters:  10/01/22 123/83  09/17/22 115/78  08/31/22 112/79    Wt Readings from Last 3 Encounters:  10/01/22 196 lb (88.9 kg)  09/17/22 194 lb 6.4 oz (88.2 kg)  08/31/22 194 lb (88 kg)    Physical Exam Vitals and nursing note reviewed.  Constitutional:      Appearance: Normal appearance. She is well-developed.  HENT:     Head: Normocephalic and atraumatic.  Eyes:     Pupils: Pupils are equal, round, and reactive to light.  Cardiovascular:     Rate and Rhythm: Normal rate and regular rhythm.     Pulses: Normal pulses.     Heart sounds: Normal heart sounds.  Pulmonary:     Effort: Pulmonary effort is normal.     Breath sounds: Normal breath sounds.  Abdominal:     Palpations: Abdomen is soft.  Musculoskeletal:        General: Normal range of motion.     Cervical back: Normal range of motion and neck supple.  Lymphadenopathy:     Cervical: No cervical adenopathy.  Skin:    General: Skin is warm and dry.     Capillary Refill: Capillary refill takes less than 2 seconds.  Neurological:     General: No focal deficit present.     Mental Status: She is alert and oriented to person, place, and time.  Psychiatric:        Attention and Perception: Attention and perception normal.        Mood and Affect: Affect normal. Mood is anxious and depressed.        Speech: Speech normal.        Behavior: Behavior normal. Behavior is cooperative.        Thought Content: Thought content normal.        Cognition and Memory: Cognition and memory normal.        Judgment: Judgment normal.      Assessment & Plan    1. Generalized anxiety  disorder Increase dose sertraline to  100 mg daily. Reviewed coping and stress relief techniques. Reassess in 3 months   2. Acute confusional migraine Does well with maxalt. Continue as needed and as prescribed.  - rizatriptan (MAXALT) 10 MG tablet; Take 1 tablet (10 mg total) by mouth as needed for migraine. May repeat in 2 hours if needed  Dispense: 10 tablet; Refill: 3  3. Injury of left peroneal nerve, initial encounter Start physical therapy as scheduled.   4. BMI 34.0-34.9,adult Discussed lowering calorie intake to 1500  calories per day and incorporating exercise into daily routine to help lose weight.    Return in about 3 months (around 12/17/2022) for mood - increased sertraline to 100 mg daily. Marland Kitchen         Ronnell Freshwater, NP  [facility] [phone] (phone) [phone] (fax)  [place]

## 2022-09-17 NOTE — Telephone Encounter (Signed)
LVM for patient to call office to inform her about what we found out about her insurance.

## 2022-09-17 NOTE — Telephone Encounter (Signed)
Pt was calling reporting that she has taken 2 rizatriptan (MAXALT) 10 MG tablet  and no relief.

## 2022-09-17 NOTE — Telephone Encounter (Signed)
Called pt she is advised to take Aleve, lie down and try applying warm compression she was agreeable to these instruction

## 2022-09-23 ENCOUNTER — Ambulatory Visit: Payer: 59

## 2022-09-23 DIAGNOSIS — M25572 Pain in left ankle and joints of left foot: Secondary | ICD-10-CM

## 2022-09-23 DIAGNOSIS — R202 Paresthesia of skin: Secondary | ICD-10-CM

## 2022-09-23 DIAGNOSIS — G90522 Complex regional pain syndrome I of left lower limb: Secondary | ICD-10-CM | POA: Diagnosis not present

## 2022-09-23 DIAGNOSIS — R2689 Other abnormalities of gait and mobility: Secondary | ICD-10-CM

## 2022-09-23 NOTE — Therapy (Signed)
OUTPATIENT PHYSICAL THERAPY TREATMENT NOTE   Patient Name: Caitlin Bryant MRN: [id number] DOB:February 15, 1981, 42 y.o., female Today's Date: 09/23/2022  PCP: Ronnell Freshwater, NP  REFERRING PROVIDER: Jennye Boroughs, MD   END OF SESSION:   PT End of Session - 09/23/22 1301     Visit Number 2    Number of Visits 13    Date for PT Re-Evaluation 11/12/22    Authorization Type [employer]et[employer]    Authorization - Visit Number 2    Authorization - Number of Visits 30    PT Start Time 1300    PT Stop Time 1342    PT Time Calculation (min) 42 min    Activity Tolerance Patient limited by pain    Behavior During Therapy WFL for tasks assessed/performed             Past Medical History:  Diagnosis Date   Anxiety    Headache    MRSA (methicillin resistant staph aureus) culture positive    5 or more years ago, right thumb   Past Surgical History:  Procedure Laterality Date   ANKLE ARTHROSCOPY WITH RECONSTRUCTION Left 08/19/2021   Procedure: LEFT ANKLE ARTHROSCOPY, ARTHROSCOPIC TREATMENT OF TALUS OSTEOCHONDRAL LESION, LATERAL LIGAMENT RECONSTRUCTION, PERONEAL TENDON DEBRIDEMENT, REPAIR OF DISLOCATING PERONEAL TENDONS;  Surgeon: Erle Crocker, MD;  Location: [facility];  Service: Orthopedics;  Laterality: Left;  LENGTH OF SURGERY: 120 MINUTES   CESAREAN SECTION  2004 and 2008   x 2   DIAGNOSTIC LAPAROSCOPY     TONSILLECTOMY     Patient Active Problem List   Diagnosis Date Noted   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 05/24/2022   Left peroneal nerve injury 05/24/2022   Cutaneous candidiasis 05/24/2022   BMI 33.0-33.9,adult 09/28/2021   Left foot pain 09/28/2021   Overweight with body mass index (BMI) 25.0-29.9 06/25/2021   Acute right-sided low back pain with right-sided sciatica 06/15/2021   Hip pain 06/15/2021   Disorder of lumbosacral intervertebral disc 06/15/2021   Chronic pain of left ankle 06/08/2021   Routine general medical  examination at a health care facility 03/17/2021   Encounter to establish care 02/23/2021   Other fatigue 02/23/2021   Viral upper respiratory infection 12/04/2020   Acute bilateral low back pain without sciatica 06/09/2019   Sore throat 06/09/2018   Left knee pain 04/20/2018   Impacted cerumen of right ear 04/15/2017   Acute non-recurrent pansinusitis 10/09/2016   Neck swelling 08/07/2016   Mouth sores 07/24/2015   Depression 07/24/2015   TMJ dysfunction 07/03/2015   Acute confusional migraine 05/10/2015   Ankle instability, left 02/22/2015   Generalized anxiety disorder 01/31/2015   Allergic rhinitis 01/31/2015    REFERRING DIAG: L45.625 (ICD-10-CM) - Complex regional pain syndrome type 1 of left lower extremity   THERAPY DIAG:  Pain in left ankle and joints of left foot  Other abnormalities of gait and mobility  Paresthesia of skin  Rationale for Evaluation and Treatment Rehabilitation  PERTINENT HISTORY: multiple MSK issues, migraines, anxiety   PRECAUTIONS: None  SUBJECTIVE:  SUBJECTIVE STATEMENT:  Patient reports continued variable pain that does not have a pattern or specific triggers.   PAIN:  Are you having pain: yes 4/10 (averages 4-5/10) Location: L hip, knee, ankle, foot, and toes How would you describe your pain? Burning, shooting, tingling Best in past week: 4/10 Worst in past week: 8/10 Aggravating factors: WB, movement, touch, difficulty sleeping due to positioning  Easing factors: positioning (especially dependent positioning)    OBJECTIVE: (objective measures completed at initial evaluation unless otherwise dated)   DIAGNOSTIC FINDINGS:  Pre surgical MRI showing damaged cartilage and loosened ligaments, pt reports multiple EMGs and other imaging since surgery. Unable to  view these in epic. Ankle scope Dec 2022. Per referral pt with suspected CRPS.    PATIENT SURVEYS:  FOTO 34% current, 56% predicted   COGNITION: Overall cognitive status: Within functional limits for tasks assessed                         SENSATION: Globally reduced sensation distal to knee - tingling to light touch dorsal L foot, hallux, and lateral foot With rough towel, increased tingling lateral foot and dorsal foot compared to light touch Increased burning medial calf and toes after sensation testing   EDEMA:  Mild edema throughout L foot dorsally and achilles tendon     PALPATION: TTP throughout ankle, foot, and lower limb globally    LOWER EXTREMITY ROM:    Active ROM Right eval Left eval  Knee flexion      Knee extension      Ankle dorsiflexion 10 2  Ankle plantarflexion   46  Ankle inversion 50 32  Ankle eversion 35 25   (Blank rows = not tested) (Key: WFL = within functional limits not formally assessed, * = concordant pain, s = stiffness/stretching sensation, NT = not tested)  Comments:     LOWER EXTREMITY MMT:   MMT Right eval Left eval  Knee flexion      Knee extension      Ankle dorsiflexion      Ankle plantarflexion      Ankle inversion      Ankle eversion       (Blank rows = not tested) Comments:     (Blank rows = not tested) (Key: WFL = within functional limits not formally assessed, * = concordant pain, s = stiffness/stretching sensation, NT = not tested)  Comments:       FUNCTIONAL TESTS:  Sit to stand: B UE support from standard surface, inc WB through RLE    GAIT: Distance walked: within clinic  Assistive device utilized: None Level of assistance: Complete Independence Comments: antalgic gait LLE, reduced truncal rotation B, partial step through pattern leading with RLE     TODAY'S TREATMENT:             [facility] Adult PT Treatment:                                                DATE: 09/23/22 Therapeutic Exercise: Ankle pumps  x25 Ankle circles x20 CW/CCW Ankle alphabet x1 Self Care: Densensitization techniques - light touch, pillowcase   [facility] Adult PT Treatment:  DATE: 09/17/22 Therapeutic Activity: Discussion re: activity modification, symptom monitoring and response to activity, introductory PNE as it pertains to activity and symptom behavior   PATIENT EDUCATION:  Education details: Pt education on PT impairments, prognosis, and POC. Informed consent. Rationale for interventions, introductory PNE Person educated: Patient Education method: Explanation, Demonstration, Tactile cues, Verbal cues Education comprehension: verbalized understanding, returned demonstration, verbal cues required, tactile cues required, and needs further education     HOME EXERCISE PROGRAM: Deferred on this date due to time constraints in favor of inc time with pt education  Access Code: [email or web address] URL: [email or web address] Date: 09/23/2022 Prepared by: Berta Minor  Patient Education - Rubbing with Different Textures   ASSESSMENT:   CLINICAL IMPRESSION: Patient presents to first follow up PT session reporting that she had increased pain after her evaluation, but that it only lasted through the afternoon. Session today focused on gentle ROM and introduction to desensitization techniques. She finds light pressure and touch with the pillowcase the most aggravating, increasing tingling and burning sensations along the toes and calf. Patient continues to benefit from skilled PT services and should be progressed as able to improve functional independence.     OBJECTIVE IMPAIRMENTS: Abnormal gait, decreased activity tolerance, decreased balance, decreased endurance, decreased mobility, difficulty walking, decreased ROM, decreased strength, hypomobility, increased edema, impaired perceived functional ability, impaired sensation, postural dysfunction, and pain.    ACTIVITY  LIMITATIONS: carrying, lifting, bending, sitting, standing, squatting, sleeping, stairs, transfers, and locomotion level   PARTICIPATION LIMITATIONS: meal prep, cleaning, laundry, driving, shopping, community activity, and occupation   PERSONAL FACTORS: Time since onset of injury/illness/exacerbation and 1 comorbidity: anxiety/depression  are also affecting patient's functional outcome.    REHAB POTENTIAL: Fair given anatomy/physiology of condition and comorbidity   CLINICAL DECISION MAKING: Evolving/moderate complexity   EVALUATION COMPLEXITY: Moderate     GOALS: Goals reviewed with patient? No   SHORT TERM GOALS: Target date: 10/15/2022     Pt will demonstrate appropriate understanding and performance of initially prescribed HEP in order to facilitate improved independence with management of symptoms.  Baseline: HEP provided on eval Goal status: INITIAL    2. Pt will score greater than or equal to 44% on FOTO in order to demonstrate improved perception of function due to symptoms.            Baseline: 34%            Goal status: INITIAL       LONG TERM GOALS: Target date: 11/12/2022    Pt will score 56% or greater on FOTO in order to demonstrate improved perception of function due to symptoms.  Baseline: 34% Goal status: INITIAL   2.  Pt will demonstrate at least 10 degrees of L ankle DF AROM in order to facilitate improved tolerance to functional movements such as transfers/gait.  Baseline: see ROM chart above Goal status: INITIAL   3.  Pt will be able to perform floor transfer w/ less than 2pt increase in resting pain in order to promote increased tolerance to work activities.  Baseline: unable to perform  Goal status: INITIAL   4.  Pt will be able to perform sit to stand transfer without UE support and less than 2pt increase in resting pain to improve safety w/ transfers.  Baseline: B UE support and altered mechanics Goal status: INITIAL      PLAN:   PT FREQUENCY:  2x/week   PT DURATION: 8 weeks   PLANNED INTERVENTIONS: Therapeutic exercises, Therapeutic activity, Neuromuscular re-education,  Balance training, Gait training, Patient/Family education, Self Care, Joint mobilization, Stair training, Aquatic Therapy, Electrical stimulation, Cryotherapy, Moist heat, Taping, Manual therapy, and Re-evaluation   PLAN FOR NEXT SESSION: establish HEP. Sensory training, comfortable ROM. Consider aquatic therapy if pt interested   Margarette Canada, PTA 09/23/2022, 1:50 PM

## 2022-09-24 ENCOUNTER — Ambulatory Visit: Payer: 59 | Attending: Physical Medicine & Rehabilitation

## 2022-09-24 DIAGNOSIS — R202 Paresthesia of skin: Secondary | ICD-10-CM | POA: Insufficient documentation

## 2022-09-24 DIAGNOSIS — R2689 Other abnormalities of gait and mobility: Secondary | ICD-10-CM | POA: Insufficient documentation

## 2022-09-24 DIAGNOSIS — M25572 Pain in left ankle and joints of left foot: Secondary | ICD-10-CM | POA: Diagnosis not present

## 2022-09-24 NOTE — Therapy (Signed)
OUTPATIENT PHYSICAL THERAPY TREATMENT NOTE   Patient Name: Caitlin Bryant MRN: [id number] DOB:1980/09/29, 42 y.o., female Today's Date: 09/24/2022  PCP: Carlean Jews, NP  REFERRING PROVIDER: Fanny Dance, MD   END OF SESSION:   PT End of Session - 09/24/22 1654     Visit Number 3    Number of Visits 13    Date for PT Re-Evaluation 11/12/22    Authorization Type Aetna    Authorization - Visit Number 2    Authorization - Number of Visits 30    PT Start Time 1700    PT Stop Time 1740    PT Time Calculation (min) 40 min    Activity Tolerance Patient limited by pain    Behavior During Therapy WFL for tasks assessed/performed              Past Medical History:  Diagnosis Date   Anxiety    Headache    MRSA (methicillin resistant staph aureus) culture positive    5 or more years ago, right thumb   Past Surgical History:  Procedure Laterality Date   ANKLE ARTHROSCOPY WITH RECONSTRUCTION Left 08/19/2021   Procedure: LEFT ANKLE ARTHROSCOPY, ARTHROSCOPIC TREATMENT OF TALUS OSTEOCHONDRAL LESION, LATERAL LIGAMENT RECONSTRUCTION, PERONEAL TENDON DEBRIDEMENT, REPAIR OF DISLOCATING PERONEAL TENDONS;  Surgeon: Terance Hart, MD;  Location: [facility];  Service: Orthopedics;  Laterality: Left;  LENGTH OF SURGERY: 120 MINUTES   CESAREAN SECTION  2004 and 2008   x 2   DIAGNOSTIC LAPAROSCOPY     TONSILLECTOMY     Patient Active Problem List   Diagnosis Date Noted   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 05/24/2022   Left peroneal nerve injury 05/24/2022   Cutaneous candidiasis 05/24/2022   BMI 33.0-33.9,adult 09/28/2021   Left foot pain 09/28/2021   Overweight with body mass index (BMI) 25.0-29.9 06/25/2021   Acute right-sided low back pain with right-sided sciatica 06/15/2021   Hip pain 06/15/2021   Disorder of lumbosacral intervertebral disc 06/15/2021   Chronic pain of left ankle 06/08/2021   Routine general medical  examination at a health care facility 03/17/2021   Encounter to establish care 02/23/2021   Other fatigue 02/23/2021   Viral upper respiratory infection 12/04/2020   Acute bilateral low back pain without sciatica 06/09/2019   Sore throat 06/09/2018   Left knee pain 04/20/2018   Impacted cerumen of right ear 04/15/2017   Acute non-recurrent pansinusitis 10/09/2016   Neck swelling 08/07/2016   Mouth sores 07/24/2015   Depression 07/24/2015   TMJ dysfunction 07/03/2015   Acute confusional migraine 05/10/2015   Ankle instability, left 02/22/2015   Generalized anxiety disorder 01/31/2015   Allergic rhinitis 01/31/2015    REFERRING DIAG: V56.433 (ICD-10-CM) - Complex regional pain syndrome type 1 of left lower extremity   THERAPY DIAG:  Pain in left ankle and joints of left foot  Other abnormalities of gait and mobility  Paresthesia of skin  Rationale for Evaluation and Treatment Rehabilitation  PERTINENT HISTORY: multiple MSK issues, migraines, anxiety   PRECAUTIONS: None  SUBJECTIVE:  SUBJECTIVE STATEMENT:  Patient reports she did a lot of cleaning this morning and that this increased her pain. She states it is the highest when she is walking. She also states that after yesterdays appointment she had some pain and burning from her toes to her hip. She wants to work on being able to sit crossed legged again.    PAIN:  Are you having pain: yes 4-8/10 (averages 4-5/10) Location: L hip, knee, ankle, foot, and toes How would you describe your pain? Burning, shooting, tingling Best in past week: 4/10 Worst in past week: 8/10 Aggravating factors: WB, movement, touch, difficulty sleeping due to positioning  Easing factors: positioning (especially dependent positioning)    OBJECTIVE: (objective measures  completed at initial evaluation unless otherwise dated)   DIAGNOSTIC FINDINGS:  Pre surgical MRI showing damaged cartilage and loosened ligaments, pt reports multiple EMGs and other imaging since surgery. Unable to view these in epic. Ankle scope Dec 2022. Per referral pt with suspected CRPS.    PATIENT SURVEYS:  FOTO 34% current, 56% predicted   COGNITION: Overall cognitive status: Within functional limits for tasks assessed                         SENSATION: Globally reduced sensation distal to knee - tingling to light touch dorsal L foot, hallux, and lateral foot With rough towel, increased tingling lateral foot and dorsal foot compared to light touch Increased burning medial calf and toes after sensation testing   EDEMA:  Mild edema throughout L foot dorsally and achilles tendon     PALPATION: TTP throughout ankle, foot, and lower limb globally    LOWER EXTREMITY ROM:    Active ROM Right eval Left eval  Knee flexion      Knee extension      Ankle dorsiflexion 10 2  Ankle plantarflexion   46  Ankle inversion 50 32  Ankle eversion 35 25   (Blank rows = not tested) (Key: WFL = within functional limits not formally assessed, * = concordant pain, s = stiffness/stretching sensation, NT = not tested)  Comments:     LOWER EXTREMITY MMT:   MMT Right eval Left eval  Knee flexion      Knee extension      Ankle dorsiflexion      Ankle plantarflexion      Ankle inversion      Ankle eversion       (Blank rows = not tested) Comments:     (Blank rows = not tested) (Key: WFL = within functional limits not formally assessed, * = concordant pain, s = stiffness/stretching sensation, NT = not tested)  Comments:       FUNCTIONAL TESTS:  Sit to stand: B UE support from standard surface, inc WB through RLE    GAIT: Distance walked: within clinic  Assistive device utilized: None Level of assistance: Complete Independence Comments: antalgic gait LLE, reduced truncal  rotation B, partial step through pattern leading with RLE     TODAY'S TREATMENT:             [facility] Adult PT Treatment:                                                DATE: 09/24/22 Therapeutic Exercise: Ankle pumps 2x10 Ankle circles x10 CW/CCW Seated rocker board  PF/DF/, inv/ev 2x1' Manual Therapy: Desensitization techniques - light touch, gloves, pillowcase, towel (tingling most with towel, no pain, describes as "weird")   [facility] Adult PT Treatment:                                                DATE: 09/23/22 Therapeutic Exercise: Ankle pumps x25 Ankle circles x20 CW/CCW Ankle alphabet x1 Self Care: Densensitization techniques - light touch, pillowcase   [facility] Adult PT Treatment:                                                DATE: 09/17/22 Therapeutic Activity: Discussion re: activity modification, symptom monitoring and response to activity, introductory PNE as it pertains to activity and symptom behavior   PATIENT EDUCATION:  Education details: Pt education on PT impairments, prognosis, and POC. Informed consent. Rationale for interventions, introductory PNE Person educated: Patient Education method: Explanation, Demonstration, Tactile cues, Verbal cues Education comprehension: verbalized understanding, returned demonstration, verbal cues required, tactile cues required, and needs further education     HOME EXERCISE PROGRAM: Deferred on this date due to time constraints in favor of inc time with pt education  Access Code: [email or web address] URL: [email or web address] Date: 09/23/2022 Prepared by: Margarette Canada  Patient Education - Rubbing with Different Textures   ASSESSMENT:   CLINICAL IMPRESSION: Patient presents to PT reporting high levels of pain today and states that she overdid it this morning with cleaning her house. She has begun working on desensitization techniques at home with some pain. Session today focused on AROM, AAROM, and manual techniques to  decrease sensations of pain. She is limited by pain throughout session, but is very motivated to continue working. Patient continues to benefit from skilled PT services and should be progressed as able to improve functional independence.   OBJECTIVE IMPAIRMENTS: Abnormal gait, decreased activity tolerance, decreased balance, decreased endurance, decreased mobility, difficulty walking, decreased ROM, decreased strength, hypomobility, increased edema, impaired perceived functional ability, impaired sensation, postural dysfunction, and pain.    ACTIVITY LIMITATIONS: carrying, lifting, bending, sitting, standing, squatting, sleeping, stairs, transfers, and locomotion level   PARTICIPATION LIMITATIONS: meal prep, cleaning, laundry, driving, shopping, community activity, and occupation   PERSONAL FACTORS: Time since onset of injury/illness/exacerbation and 1 comorbidity: anxiety/depression  are also affecting patient's functional outcome.    REHAB POTENTIAL: Fair given anatomy/physiology of condition and comorbidity   CLINICAL DECISION MAKING: Evolving/moderate complexity   EVALUATION COMPLEXITY: Moderate     GOALS: Goals reviewed with patient? No   SHORT TERM GOALS: Target date: 10/15/2022     Pt will demonstrate appropriate understanding and performance of initially prescribed HEP in order to facilitate improved independence with management of symptoms.  Baseline: HEP provided on eval Goal status: INITIAL    2. Pt will score greater than or equal to 44% on FOTO in order to demonstrate improved perception of function due to symptoms.            Baseline: 34%            Goal status: INITIAL       LONG TERM GOALS: Target date: 11/12/2022    Pt will score 56% or greater on FOTO in order to demonstrate improved perception of function due  to symptoms.  Baseline: 34% Goal status: INITIAL   2.  Pt will demonstrate at least 10 degrees of L ankle DF AROM in order to facilitate improved  tolerance to functional movements such as transfers/gait.  Baseline: see ROM chart above Goal status: INITIAL   3.  Pt will be able to perform floor transfer w/ less than 2pt increase in resting pain in order to promote increased tolerance to work activities.  Baseline: unable to perform  Goal status: INITIAL   4.  Pt will be able to perform sit to stand transfer without UE support and less than 2pt increase in resting pain to improve safety w/ transfers.  Baseline: B UE support and altered mechanics Goal status: INITIAL      PLAN:   PT FREQUENCY: 2x/week   PT DURATION: 8 weeks   PLANNED INTERVENTIONS: Therapeutic exercises, Therapeutic activity, Neuromuscular re-education, Balance training, Gait training, Patient/Family education, Self Care, Joint mobilization, Stair training, Aquatic Therapy, Electrical stimulation, Cryotherapy, Moist heat, Taping, Manual therapy, and Re-evaluation   PLAN FOR NEXT SESSION: establish HEP. Sensory training, comfortable ROM. Consider aquatic therapy if pt interested   Margarette Canada, PTA 09/24/2022, 5:42 PM

## 2022-09-24 NOTE — Patient Instructions (Signed)
Aquatic Therapy at [facility]-  What to Expect! ? ?Where:  ? ?[facility] ?[address] [facility] [address] ?[place], [place] [address] ?Rehab phone [phone] ? ?NOTE:  You will receive an automated phone message reminding you of your appt and it will say the appointment is at the [address] [facility] [address] [facility] clinic. ? ?        ?How to Prepare: ?Please make sure you drink 8 ounces of water about one hour prior to your pool session ?A caregiver may attend if needed with the patient to help assist as needed. A caregiver can sit in the pool room on chair. ?Please arrive IN YOUR SUIT and 15 minutes prior to your appointment - this helps to avoid delays in starting your session. ?Please make sure to attend to any toileting needs prior to entering the pool ?Locker rooms for changing are provided.   There is direct access to the pool deck form the locker room.  You can lock your belongings in a locker with lock provided. ?Once on the pool deck your therapist will ask if you have signed the Patient  Consent and Assignment of Benefits form before beginning treatment ?Your therapist may take your blood pressure prior to, during and after your session if indicated ?We usually try and create a home exercise program based on activities we do in the pool.  Please be thinking about who might be able to assist you in the pool should you need to participate in an aquatic home exercise program at the time of discharge if you need assistance.  Some patients do not want to or do not have the ability to participate in an aquatic home program - this is not a barrier in any way to you participating in aquatic therapy as part of your current therapy plan! ?After Discharge from PT, you can continue using home program at  the [facility]/, there is a drop-in fee for $5 ($45 a month)or for 60 years  or older $4.00 ($40 a month for seniors ) or any local [employer] pool.  Memberships for purchase are  available for gym/pool at [facility] ? ?IT IS VERY IMPORTANT THAT YOUR LAST VISIT BE IN THE CLINIC AT [address] AFTER YOUR LAST AQUATIC VISIT.  PLEASE MAKE SURE THAT YOU HAVE A [address]  APPOINTMENT SCHEDULED. ? ? ?About the pool: ?Pool is located approximately 500 FT from the entrance of the building.  ?Please bring a support person if you need assistance traveling this      distance.  ? ?Your therapist will assist you in entering the water; there are two ways to     ?      enter: stairs with railings, and a mechanical lift. Your therapist will determine the most appropriate way for you. ? ?Water temperature is usually between 88-90 degrees ? ?There may be up to 2 other swimmers in the pool at the same time ? ?The pool deck is tile, please wear shoes with good traction if you prefer not to be barefoot.  ? ? ?Contact Info:  ?For appointment scheduling and cancellations:         Please call the [facility]  PH:[phone]              Aquatic Therapy  ?[facility]       All sessions are 45 minutes ? ? ? ? ?        ? ?                ?                      ?

## 2022-09-29 ENCOUNTER — Ambulatory Visit: Payer: 59 | Admitting: Physical Therapy

## 2022-09-29 ENCOUNTER — Encounter: Payer: Self-pay | Admitting: Physical Therapy

## 2022-09-29 DIAGNOSIS — R2689 Other abnormalities of gait and mobility: Secondary | ICD-10-CM

## 2022-09-29 DIAGNOSIS — M25572 Pain in left ankle and joints of left foot: Secondary | ICD-10-CM

## 2022-09-29 DIAGNOSIS — R202 Paresthesia of skin: Secondary | ICD-10-CM | POA: Diagnosis not present

## 2022-09-29 NOTE — Therapy (Signed)
OUTPATIENT PHYSICAL THERAPY TREATMENT NOTE   Patient Name: Caitlin Bryant MRN: [id number] DOB:[birth year], 42 y.o., female Today's Date: 09/29/2022  PCP: Ronnell Freshwater, NP  REFERRING PROVIDER: Jennye Boroughs, MD   END OF SESSION:   PT End of Session - 09/29/22 1637     Visit Number 4    Number of Visits 13    Date for PT Re-Evaluation 11/12/22    Authorization Type [employer]    Authorization - Visit Number 4    Authorization - Number of Visits 30    PT Start Time 3893   late check in   PT Stop Time 7342    PT Time Calculation (min) 46 min    Activity Tolerance Patient limited by pain;No increased pain    Behavior During Therapy WFL for tasks assessed/performed               Past Medical History:  Diagnosis Date   Anxiety    Headache    MRSA (methicillin resistant staph aureus) culture positive    5 or more years ago, right thumb   Past Surgical History:  Procedure Laterality Date   ANKLE ARTHROSCOPY WITH RECONSTRUCTION Left 08/19/2021   Procedure: LEFT ANKLE ARTHROSCOPY, ARTHROSCOPIC TREATMENT OF TALUS OSTEOCHONDRAL LESION, LATERAL LIGAMENT RECONSTRUCTION, PERONEAL TENDON DEBRIDEMENT, REPAIR OF DISLOCATING PERONEAL TENDONS;  Surgeon: Erle Crocker, MD;  Location: [facility];  Service: Orthopedics;  Laterality: Left;  LENGTH OF SURGERY: 120 MINUTES   CESAREAN SECTION  2004 and 2008   x 2   DIAGNOSTIC LAPAROSCOPY     TONSILLECTOMY     Patient Active Problem List   Diagnosis Date Noted   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 05/24/2022   Left peroneal nerve injury 05/24/2022   Cutaneous candidiasis 05/24/2022   BMI 33.0-33.9,adult 09/28/2021   Left foot pain 09/28/2021   Overweight with body mass index (BMI) 25.0-29.9 06/25/2021   Acute right-sided low back pain with right-sided sciatica 06/15/2021   Hip pain 06/15/2021   Disorder of lumbosacral intervertebral disc 06/15/2021   Chronic pain of left ankle  06/08/2021   Routine general medical examination at a health care facility 03/17/2021   Encounter to establish care 02/23/2021   Other fatigue 02/23/2021   Viral upper respiratory infection 12/04/2020   Acute bilateral low back pain without sciatica 06/09/2019   Sore throat 06/09/2018   Left knee pain 04/20/2018   Impacted cerumen of right ear 04/15/2017   Acute non-recurrent pansinusitis 10/09/2016   Neck swelling 08/07/2016   Mouth sores 07/24/2015   Depression 07/24/2015   TMJ dysfunction 07/03/2015   Acute confusional migraine 05/10/2015   Ankle instability, left 02/22/2015   Generalized anxiety disorder 01/31/2015   Allergic rhinitis 01/31/2015    REFERRING DIAG: A76.811 (ICD-10-CM) - Complex regional pain syndrome type 1 of left lower extremity   THERAPY DIAG:  Pain in left ankle and joints of left foot  Other abnormalities of gait and mobility  Paresthesia of skin  Rationale for Evaluation and Treatment Rehabilitation  PERTINENT HISTORY: multiple MSK issues, migraines, anxiety   PRECAUTIONS: None  SUBJECTIVE:  SUBJECTIVE STATEMENT:   Pt reports some increased pain after sessions that is most prominent for remainder of day after sessions. Continues to have fluctuations overall. States she was very busy over the weekend for daughter's birthday so symptoms are more sensitive today.   PAIN:  Are you having pain: yes 4-8/10 (averages 4-5/10) Location: L hip, knee, ankle, foot, and toes How would you describe your pain? Burning, shooting, tingling Best in past week: 4/10 Worst in past week: 8/10 Aggravating factors: WB, movement, touch, difficulty sleeping due to positioning  Easing factors: positioning (especially dependent positioning)    OBJECTIVE: (objective measures completed at  initial evaluation unless otherwise dated)   DIAGNOSTIC FINDINGS:  Pre surgical MRI showing damaged cartilage and loosened ligaments, pt reports multiple EMGs and other imaging since surgery. Unable to view these in epic. Ankle scope Dec 2022. Per referral pt with suspected CRPS.    PATIENT SURVEYS:  FOTO 34% current, 56% predicted   COGNITION: Overall cognitive status: Within functional limits for tasks assessed                         SENSATION: Globally reduced sensation distal to knee - tingling to light touch dorsal L foot, hallux, and lateral foot With rough towel, increased tingling lateral foot and dorsal foot compared to light touch Increased burning medial calf and toes after sensation testing   EDEMA:  Mild edema throughout L foot dorsally and achilles tendon     PALPATION: TTP throughout ankle, foot, and lower limb globally    LOWER EXTREMITY ROM:    Active ROM Right eval Left eval  Knee flexion      Knee extension      Ankle dorsiflexion 10 2  Ankle plantarflexion   46  Ankle inversion 50 32  Ankle eversion 35 25   (Blank rows = not tested) (Key: WFL = within functional limits not formally assessed, * = concordant pain, s = stiffness/stretching sensation, NT = not tested)  Comments:     LOWER EXTREMITY MMT:   MMT Right eval Left eval  Knee flexion      Knee extension      Ankle dorsiflexion      Ankle plantarflexion      Ankle inversion      Ankle eversion       (Blank rows = not tested) Comments:     (Blank rows = not tested) (Key: WFL = within functional limits not formally assessed, * = concordant pain, s = stiffness/stretching sensation, NT = not tested)  Comments:       FUNCTIONAL TESTS:  Sit to stand: B UE support from standard surface, inc WB through RLE    GAIT: Distance walked: within clinic  Assistive device utilized: None Level of assistance: Complete Independence Comments: antalgic gait LLE, reduced truncal rotation B, partial  step through pattern leading with RLE     TODAY'S TREATMENT:             [facility] Adult PT Treatment:                                                DATE: 09/29/22 Therapeutic Exercise: AP rocker board  3x10 cues for form and pacing  Seated inversion/eversion towel slides 2x10 cues for control and comfortable ROM Toe extension 2x8 minimal WB cues  for tripod foot and pacing  Neuromuscular re-ed: PNE, relevant anatomy/physiology Desensitization techniques; long sitting LLE Pillowcase; beginning proximally at knee moving distally, longitudinally medial, central, and lateral down to toes. 5 laps Glove; method as above, 3 laps Rough towel; method as above, 2 laps  Therapeutic Activity: Activity modification, pacing of activities, monitoring of symptoms as they relate to functional mobility   [facility] Adult PT Treatment:                                                DATE: 09/24/22 Therapeutic Exercise: Ankle pumps 2x10 Ankle circles x10 CW/CCW Seated rocker board PF/DF/, inv/ev 2x1' Manual Therapy: Desensitization techniques - light touch, gloves, pillowcase, towel (tingling most with towel, no pain, describes as "weird")   [facility] Adult PT Treatment:                                                DATE: 09/23/22 Therapeutic Exercise: Ankle pumps x25 Ankle circles x20 CW/CCW Ankle alphabet x1 Self Care: Densensitization techniques - light touch, pillowcase    PATIENT EDUCATION:  Education details: rationale for interventions, relevant anatomy/physiology, PNE Person educated: Patient Education method: Explanation, Demonstration, Tactile cues, Verbal cues Education comprehension: verbalized understanding, returned demonstration, verbal cues required, tactile cues required, and needs further education     HOME EXERCISE PROGRAM:  Access Code: [email or web address] URL: [email or web address] Date: 09/23/2022 Prepared by: Berta Minor  Patient Education - Rubbing with Different  Textures   ASSESSMENT:   CLINICAL IMPRESSION: Pt arrives with increased sensitivity today she attributes to increased activity over weekend, reports some increase in pain after sessions that returns to baseline by the following day. Today focusing on uniplanar AROM of ankle/foot with emphasis on control, pt with fair tolerance, denies any overt increase in pain with rest breaks. Followed with desensitization techniques as above, pt denies any overt increase in pain but does endorse some increased "tingling" in toes, most notable after rough towel. Education as above. No adverse events, education on monitoring symptom response to calibrate interventions at next session with goal of gradually improving sensory/mobility tolerance. Pt departs today's session in no acute distress, all voiced questions/concerns addressed appropriately from PT perspective.     OBJECTIVE IMPAIRMENTS: Abnormal gait, decreased activity tolerance, decreased balance, decreased endurance, decreased mobility, difficulty walking, decreased ROM, decreased strength, hypomobility, increased edema, impaired perceived functional ability, impaired sensation, postural dysfunction, and pain.    ACTIVITY LIMITATIONS: carrying, lifting, bending, sitting, standing, squatting, sleeping, stairs, transfers, and locomotion level   PARTICIPATION LIMITATIONS: meal prep, cleaning, laundry, driving, shopping, community activity, and occupation   PERSONAL FACTORS: Time since onset of injury/illness/exacerbation and 1 comorbidity: anxiety/depression  are also affecting patient's functional outcome.    REHAB POTENTIAL: Fair given anatomy/physiology of condition and comorbidity   CLINICAL DECISION MAKING: Evolving/moderate complexity   EVALUATION COMPLEXITY: Moderate     GOALS: Goals reviewed with patient? No   SHORT TERM GOALS: Target date: 10/15/2022     Pt will demonstrate appropriate understanding and performance of initially prescribed  HEP in order to facilitate improved independence with management of symptoms.  Baseline: HEP provided on eval Goal status: INITIAL    2. Pt will score greater than or equal to 44% on FOTO  in order to demonstrate improved perception of function due to symptoms.            Baseline: 34%            Goal status: INITIAL       LONG TERM GOALS: Target date: 11/12/2022    Pt will score 56% or greater on FOTO in order to demonstrate improved perception of function due to symptoms.  Baseline: 34% Goal status: INITIAL   2.  Pt will demonstrate at least 10 degrees of L ankle DF AROM in order to facilitate improved tolerance to functional movements such as transfers/gait.  Baseline: see ROM chart above Goal status: INITIAL   3.  Pt will be able to perform floor transfer w/ less than 2pt increase in resting pain in order to promote increased tolerance to work activities.  Baseline: unable to perform  Goal status: INITIAL   4.  Pt will be able to perform sit to stand transfer without UE support and less than 2pt increase in resting pain to improve safety w/ transfers.  Baseline: B UE support and altered mechanics Goal status: INITIAL      PLAN:   PT FREQUENCY: 2x/week   PT DURATION: 8 weeks   PLANNED INTERVENTIONS: Therapeutic exercises, Therapeutic activity, Neuromuscular re-education, Balance training, Gait training, Patient/Family education, Self Care, Joint mobilization, Stair training, Aquatic Therapy, Electrical stimulation, Cryotherapy, Moist heat, Taping, Manual therapy, and Re-evaluation   PLAN FOR NEXT SESSION: pending response to today's session, consider addition of towel eversion/inversion and seated heel/toe raise to HEP. Continue desensitization techniques as able/appropriate     Leeroy Cha PT, DPT 09/29/2022 5:30 PM

## 2022-10-01 ENCOUNTER — Ambulatory Visit: Payer: 59

## 2022-10-01 ENCOUNTER — Encounter: Payer: Self-pay | Admitting: Physical Medicine & Rehabilitation

## 2022-10-01 ENCOUNTER — Encounter: Payer: 59 | Attending: Physical Medicine & Rehabilitation | Admitting: Physical Medicine & Rehabilitation

## 2022-10-01 VITALS — BP 123/83 | HR 72 | Ht 63.0 in | Wt 196.0 lb

## 2022-10-01 DIAGNOSIS — R2689 Other abnormalities of gait and mobility: Secondary | ICD-10-CM | POA: Diagnosis not present

## 2022-10-01 DIAGNOSIS — G90522 Complex regional pain syndrome I of left lower limb: Secondary | ICD-10-CM | POA: Insufficient documentation

## 2022-10-01 DIAGNOSIS — M25572 Pain in left ankle and joints of left foot: Secondary | ICD-10-CM | POA: Insufficient documentation

## 2022-10-01 DIAGNOSIS — R202 Paresthesia of skin: Secondary | ICD-10-CM | POA: Diagnosis not present

## 2022-10-01 MED ORDER — PREGABALIN 75 MG PO CAPS: 75.0000 mg | ORAL_CAPSULE | Freq: Three times a day (TID) | ORAL | 2 refills | Status: DC

## 2022-10-01 NOTE — Therapy (Signed)
OUTPATIENT PHYSICAL THERAPY TREATMENT NOTE   Patient Name: Caitlin Bryant MRN: [id number] DOB:06/14/1981, 42 y.o., female Today's Date: 10/01/2022  PCP: Carlean Jews, NP  REFERRING PROVIDER: Fanny Dance, MD   END OF SESSION:   PT End of Session - 10/01/22 1655     Visit Number 5    Number of Visits 13    Date for PT Re-Evaluation 11/12/22    Authorization Type Aet[employer]    Authorization - Visit Number 5    Authorization - Number of Visits 30    PT Start Time 1700    PT Stop Time 1740    PT Time Calculation (min) 40 min    Activity Tolerance Patient limited by pain;No increased pain    Behavior During Therapy WFL for tasks assessed/performed             Past Medical History:  Diagnosis Date   Anxiety    Headache    MRSA (methicillin resistant staph aureus) culture positive    5 or more years ago, right thumb   Past Surgical History:  Procedure Laterality Date   ANKLE ARTHROSCOPY WITH RECONSTRUCTION Left 08/19/2021   Procedure: LEFT ANKLE ARTHROSCOPY, ARTHROSCOPIC TREATMENT OF TALUS OSTEOCHONDRAL LESION, LATERAL LIGAMENT RECONSTRUCTION, PERONEAL TENDON DEBRIDEMENT, REPAIR OF DISLOCATING PERONEAL TENDONS;  Surgeon: Terance Hart, MD;  Location: [facility];  Service: Orthopedics;  Laterality: Left;  LENGTH OF SURGERY: 120 MINUTES   CESAREAN SECTION  2004 and 2008   x 2   DIAGNOSTIC LAPAROSCOPY     TONSILLECTOMY     Patient Active Problem List   Diagnosis Date Noted   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 05/24/2022   Left peroneal nerve injury 05/24/2022   Cutaneous candidiasis 05/24/2022   BMI 33.0-33.9,adult 09/28/2021   Left foot pain 09/28/2021   Overweight with body mass index (BMI) 25.0-29.9 06/25/2021   Acute right-sided low back pain with right-sided sciatica 06/15/2021   Hip pain 06/15/2021   Disorder of lumbosacral intervertebral disc 06/15/2021   Chronic pain of left ankle 06/08/2021   Routine  general medical examination at a health care facility 03/17/2021   Encounter to establish care 02/23/2021   Other fatigue 02/23/2021   Viral upper respiratory infection 12/04/2020   Acute bilateral low back pain without sciatica 06/09/2019   Sore throat 06/09/2018   Left knee pain 04/20/2018   Impacted cerumen of right ear 04/15/2017   Acute non-recurrent pansinusitis 10/09/2016   Neck swelling 08/07/2016   Mouth sores 07/24/2015   Depression 07/24/2015   TMJ dysfunction 07/03/2015   Acute confusional migraine 05/10/2015   Ankle instability, left 02/22/2015   Generalized anxiety disorder 01/31/2015   Allergic rhinitis 01/31/2015    REFERRING DIAG: X91.478 (ICD-10-CM) - Complex regional pain syndrome type 1 of left lower extremity   THERAPY DIAG:  Pain in left ankle and joints of left foot  Other abnormalities of gait and mobility  Paresthesia of skin  Rationale for Evaluation and Treatment Rehabilitation  PERTINENT HISTORY: multiple MSK issues, migraines, anxiety   PRECAUTIONS: None  SUBJECTIVE:  SUBJECTIVE STATEMENT:   Pt reports that she saw her pain management MD today who increased her pain medicine.   PAIN:  Are you having pain: yes 4-5/10 (averages 4-5/10) Location: L hip, knee, ankle, foot, and toes How would you describe your pain? Burning, shooting, tingling Best in past week: 4/10 Worst in past week: 8/10 Aggravating factors: WB, movement, touch, difficulty sleeping due to positioning  Easing factors: positioning (especially dependent positioning)    OBJECTIVE: (objective measures completed at initial evaluation unless otherwise dated)   DIAGNOSTIC FINDINGS:  Pre surgical MRI showing damaged cartilage and loosened ligaments, pt reports multiple EMGs and other imaging since  surgery. Unable to view these in epic. Ankle scope Dec 2022. Per referral pt with suspected CRPS.    PATIENT SURVEYS:  FOTO 34% current, 56% predicted   COGNITION: Overall cognitive status: Within functional limits for tasks assessed                         SENSATION: Globally reduced sensation distal to knee - tingling to light touch dorsal L foot, hallux, and lateral foot With rough towel, increased tingling lateral foot and dorsal foot compared to light touch Increased burning medial calf and toes after sensation testing   EDEMA:  Mild edema throughout L foot dorsally and achilles tendon     PALPATION: TTP throughout ankle, foot, and lower limb globally    LOWER EXTREMITY ROM:    Active ROM Right eval Left eval  Knee flexion      Knee extension      Ankle dorsiflexion 10 2  Ankle plantarflexion   46  Ankle inversion 50 32  Ankle eversion 35 25   (Blank rows = not tested) (Key: WFL = within functional limits not formally assessed, * = concordant pain, s = stiffness/stretching sensation, NT = not tested)  Comments:     LOWER EXTREMITY MMT:   MMT Right eval Left eval  Knee flexion      Knee extension      Ankle dorsiflexion      Ankle plantarflexion      Ankle inversion      Ankle eversion       (Blank rows = not tested) Comments:     (Blank rows = not tested) (Key: WFL = within functional limits not formally assessed, * = concordant pain, s = stiffness/stretching sensation, NT = not tested)  Comments:       FUNCTIONAL TESTS:  Sit to stand: B UE support from standard surface, inc WB through RLE    GAIT: Distance walked: within clinic  Assistive device utilized: None Level of assistance: Complete Independence Comments: antalgic gait LLE, reduced truncal rotation B, partial step through pattern leading with RLE     TODAY'S TREATMENT:             [facility] Adult PT Treatment:                                                DATE: 10/01/22 Therapeutic  Exercise: AP rocker board 3x10 cues for form and pacing  Seated inversion/eversion towel slides 2x10 cues for control and comfortable ROM Toe extension 2x8 minimal WB cues for tripod foot and pacing Standing weight shifting AP/lat (pain in calf and big toe) Neuromuscular re-ed: Desensitization techniques; long sitting LLE Pillowcase; beginning proximally  at knee moving distally, longitudinally medial, central, and lateral down to toes. 5 laps Glove; method as above, 3 laps Rough towel; method as above, 2 laps  [facility] Adult PT Treatment:                                                DATE: 09/29/22 Therapeutic Exercise: AP rocker board  3x10 cues for form and pacing  Seated inversion/eversion towel slides 2x10 cues for control and comfortable ROM Toe extension 2x8 minimal WB cues for tripod foot and pacing  Neuromuscular re-ed: PNE, relevant anatomy/physiology Desensitization techniques; long sitting LLE Pillowcase; beginning proximally at knee moving distally, longitudinally medial, central, and lateral down to toes. 5 laps Glove; method as above, 3 laps Rough towel; method as above, 2 laps  Therapeutic Activity: Activity modification, pacing of activities, monitoring of symptoms as they relate to functional mobility   [facility] Adult PT Treatment:                                                DATE: 09/24/22 Therapeutic Exercise: Ankle pumps 2x10 Ankle circles x10 CW/CCW Seated rocker board PF/DF/, inv/ev 2x1' Manual Therapy: Desensitization techniques - light touch, gloves, pillowcase, towel (tingling most with towel, no pain, describes as "weird")     PATIENT EDUCATION:  Education details: rationale for interventions, relevant anatomy/physiology, PNE Person educated: Patient Education method: Explanation, Demonstration, Tactile cues, Verbal cues Education comprehension: verbalized understanding, returned demonstration, verbal cues required, tactile cues required, and needs further  education     HOME EXERCISE PROGRAM:  Access Code: [email or web address] URL: [email or web address] Date: 09/23/2022 Prepared by: Margarette Canada  Patient Education - Rubbing with Different Textures   ASSESSMENT:   CLINICAL IMPRESSION: Patient reports the she has some increased sensitivity today due to going to her pain management MD. She states she had some burning and pain after the last session, but this returned to baseline pain the day after. Session today continued to focus on ROM and desensitization techniques with fair tolerance. Patient continues to benefit from skilled PT services and should be progressed as able to improve functional independence.    OBJECTIVE IMPAIRMENTS: Abnormal gait, decreased activity tolerance, decreased balance, decreased endurance, decreased mobility, difficulty walking, decreased ROM, decreased strength, hypomobility, increased edema, impaired perceived functional ability, impaired sensation, postural dysfunction, and pain.    ACTIVITY LIMITATIONS: carrying, lifting, bending, sitting, standing, squatting, sleeping, stairs, transfers, and locomotion level   PARTICIPATION LIMITATIONS: meal prep, cleaning, laundry, driving, shopping, community activity, and occupation   PERSONAL FACTORS: Time since onset of injury/illness/exacerbation and 1 comorbidity: anxiety/depression  are also affecting patient's functional outcome.    REHAB POTENTIAL: Fair given anatomy/physiology of condition and comorbidity   CLINICAL DECISION MAKING: Evolving/moderate complexity   EVALUATION COMPLEXITY: Moderate     GOALS: Goals reviewed with patient? No   SHORT TERM GOALS: Target date: 10/15/2022     Pt will demonstrate appropriate understanding and performance of initially prescribed HEP in order to facilitate improved independence with management of symptoms.  Baseline: HEP provided on eval Goal status: INITIAL    2. Pt will score greater than or equal to 44%  on FOTO in order to demonstrate improved perception of function due to symptoms.  Baseline: 34%            Goal status: INITIAL       LONG TERM GOALS: Target date: 11/12/2022    Pt will score 56% or greater on FOTO in order to demonstrate improved perception of function due to symptoms.  Baseline: 34% Goal status: INITIAL   2.  Pt will demonstrate at least 10 degrees of L ankle DF AROM in order to facilitate improved tolerance to functional movements such as transfers/gait.  Baseline: see ROM chart above Goal status: INITIAL   3.  Pt will be able to perform floor transfer w/ less than 2pt increase in resting pain in order to promote increased tolerance to work activities.  Baseline: unable to perform  Goal status: INITIAL   4.  Pt will be able to perform sit to stand transfer without UE support and less than 2pt increase in resting pain to improve safety w/ transfers.  Baseline: B UE support and altered mechanics Goal status: INITIAL      PLAN:   PT FREQUENCY: 2x/week   PT DURATION: 8 weeks   PLANNED INTERVENTIONS: Therapeutic exercises, Therapeutic activity, Neuromuscular re-education, Balance training, Gait training, Patient/Family education, Self Care, Joint mobilization, Stair training, Aquatic Therapy, Electrical stimulation, Cryotherapy, Moist heat, Taping, Manual therapy, and Re-evaluation   PLAN FOR NEXT SESSION: pending response to today's session, consider addition of towel eversion/inversion and seated heel/toe raise to HEP. Continue desensitization techniques as able/appropriate    Margarette Canada, PTA 10/01/22 5:42 PM

## 2022-10-01 NOTE — Progress Notes (Addendum)
Subjective:    Patient ID: Caitlin Bryant, female    DOB: 03-28-1981, 42 y.o.   MRN: [id number]  HPI HPI Caitlin Bryant is a 42 y.o. year old female  who  has a past medical history of Anxiety, Headache, and MRSA (methicillin resistant staph aureus) culture positive.   They are presenting to PM&R clinic as a new patient for pain management evaluation. They were referred for treatment of chronic pain. She reports her pain started after her ankle tendon surgery Aug 19, 2021.  She reports she had the surgery due to frequent ankle sprains.  She had a nerve block at the time behind her knee.  She completed physical therapy after her surgery.  Since this time has has had severe pain in her left lower extremity.  Patient reports her pain began August 19, 2021 when she had left ankle tendon surgery.  Since this time.  She has had worsening pain in her left lower extremity.  Her leg is very painful from her lower left thigh down to her foot.  She previously worked as a Pharmacist, hospital.  She has been much less active and has had weight gain.  She is only able to walk very short distances.  Pain is progressively getting worse.  She has numbness and tingling in her distal toes.  Light touch is a very painful around her foot and ankle.  The sensation of showering is very painful.  Her pain is worse at night.  She has had a lot of swelling in her lower extremities since this time from her knee down to her foot.  She has been followed by neurology.  She was seen by Dr. Marlou Sa who was suspecting CRPS.  She initially had an EMG that was reported to have altered sensory function however EMG was repeated by Dr. Berdine Addison neurology and no significant abnormalities were noted.   Red flag symptoms: Patient denies saddle anesthesia, loss of bowel or bladder continence, new weakness, new numbness/tingling, or pain waking up at nighttime.   Medications tried: Gabapentin -unsure dose, only able to take HS due to sedation, liminal  benefit for pain Ibuprofen 200-'400mg'$  2-3 times a day   Interval History 10/01/22 She reports continued pain in her left lower extremity that continues to worsen.  She has not noted significant benefit with Lyrica however she has been able to tolerate this better than gabapentin thus far.  She has started physical therapy.  She reports that desensitization techniques are tolerable initially however hours later she will have severe pain in this area.  Her pain continues to cover her entire left leg.  She sometimes feels a burning sensation in her toes.  She has a lot of paresthesias in her leg as well.  She will also be trying in aquatic therapy in addition to a land-based therapy.  Occasionally her legs will become red.  Her left leg has increased swelling compared to her right leg.  Pain Inventory Average Pain 9 Pain Right Now 5 My pain is constant, sharp, burning, dull, stabbing, tingling, and aching  In the last 24 hours, has pain interfered with the following? General activity 7 Relation with others 7 Enjoyment of life 9 What TIME of day is your pain at its worst? morning , daytime, evening, and night Sleep (in general) Poor  Pain is worse with: walking, bending, sitting, inactivity, standing, and some activites Pain improves with:  nothing Relief from Meds:  none  Family History  Problem Relation Age  of Onset   Hypertension Mother    Diabetes Mother    Osteoarthritis Mother    Fibromyalgia Mother    Heart attack Father        Defibrillator   Social History   Socioeconomic History   Marital status: Married    Spouse name: Not on file   Number of children: Not on file   Years of education: Not on file   Highest education level: Not on file  Occupational History   Not on file  Tobacco Use   Smoking status: Never   Smokeless tobacco: Never  Vaping Use   Vaping Use: Never used  Substance and Sexual Activity   Alcohol use: Yes    Comment: 1 a month or less   Drug use: No    Sexual activity: Yes    Partners: Male  Other Topics Concern   Not on file  Social History Narrative   Are you right handed or left handed? Right   Are you currently employed ? no      Do you live at home alone?husband and 2 daughters   Caffeine none   What type of home do you live in: 1 story or 2 story? One with stairs inside       Social Determinants of Health   Financial Resource Strain: Not on file  Food Insecurity: Not on file  Transportation Needs: Not on file  Physical Activity: Not on file  Stress: Not on file  Social Connections: Not on file   Past Surgical History:  Procedure Laterality Date   ANKLE ARTHROSCOPY WITH RECONSTRUCTION Left 08/19/2021   Procedure: LEFT ANKLE ARTHROSCOPY, ARTHROSCOPIC TREATMENT OF TALUS OSTEOCHONDRAL LESION, LATERAL LIGAMENT RECONSTRUCTION, PERONEAL TENDON DEBRIDEMENT, REPAIR OF DISLOCATING PERONEAL TENDONS;  Surgeon: Erle Crocker, MD;  Location: [place];  Service: Orthopedics;  Laterality: Left;  LENGTH OF SURGERY: 120 MINUTES   CESAREAN SECTION  2004 and 2008   x 2   DIAGNOSTIC LAPAROSCOPY     TONSILLECTOMY     Past Surgical History:  Procedure Laterality Date   ANKLE ARTHROSCOPY WITH RECONSTRUCTION Left 08/19/2021   Procedure: LEFT ANKLE ARTHROSCOPY, ARTHROSCOPIC TREATMENT OF TALUS OSTEOCHONDRAL LESION, LATERAL LIGAMENT RECONSTRUCTION, PERONEAL TENDON DEBRIDEMENT, REPAIR OF DISLOCATING PERONEAL TENDONS;  Surgeon: Erle Crocker, MD;  Location: [place];  Service: Orthopedics;  Laterality: Left;  LENGTH OF SURGERY: 120 MINUTES   CESAREAN SECTION  2004 and 2008   x 2   DIAGNOSTIC LAPAROSCOPY     TONSILLECTOMY     Past Medical History:  Diagnosis Date   Anxiety    Headache    MRSA (methicillin resistant staph aureus) culture positive    5 or more years ago, right thumb   BP 123/83   Pulse 72   Ht '5\' 3"'$  (1.6 m)   Wt 196 lb (88.9 kg)   LMP 09/17/2022   SpO2 98%   BMI 34.72  kg/m   Opioid Risk Score:   Fall Risk Score:  `1  Depression screen [facility] 2/9     10/01/2022    3:52 PM 09/17/2022    9:17 AM 08/31/2022    3:09 PM 06/17/2022   10:17 AM 05/06/2022   10:37 AM 03/24/2022   10:17 AM 07/07/2021   11:04 AM  Depression screen PHQ 2/9  Decreased Interest 0 '1 3 1 1 1 1  '$ Down, Depressed, Hopeless 0 1 2 0 1 0 0  PHQ - 2 Score 0 '2 5 1 '$ 2  1 1  Altered sleeping  2 3 0 0 1 1  Tired, decreased energy  '1 2 1 1 1 1  '$ Change in appetite  '2 2 1 1 1 1  '$ Feeling bad or failure about yourself   0 0 0 1 0 0  Trouble concentrating  1 0 '1 1 1 1  '$ Moving slowly or fidgety/restless  0 0 0 1 0 0  Suicidal thoughts  0 0 0 0 0 0  PHQ-9 Score  '8 12 4 7 5 5  '$ Difficult doing work/chores   Very difficult   Not difficult at all     Review of Systems  Musculoskeletal:        Pain in left hip down to the toes  All other systems reviewed and are negative.      Objective:   Physical Exam   Gen: no distress, normal appearing HEENT: oral mucosa pink and moist, NCAT Chest: normal effort, normal rate of breathing Abd: soft, non-distended Psych: pleasant, normal affect Skin: intact Neuro: CN 2-12 grossly intact, follows commands,  Strength RLE 5/5 throughout Strength RLE at least 4/5 limited by pain Sensation intact light touch in all 4 extremities however she reports his altered in her right lower extremity Antalgic gait Musculoskeletal: Allodynia LLE starting lower thigh and below  Pain with movement of L ankle in all directions, pain with movement L knee  Swelling noted in LUE compared to RUE Healed surgical incision on her left ankle     Assessment & Plan:     LLE pain after L ankle tendon surgery 2022 -Normal EMG LLE 08/10/22 -Agree with neurology and orthopedics, suspect CRPS type 1 -Continue Ibuprofen  -Lyrica - Increase to '50mg'$  TID then up to '75mg'$  TID -Continue PT, aquatherapy planned -Completed form for handicap parking  -Consider sympathetic nerve  block  Addendum- 10/16/22 called her to discuss, she was unable to tolerate lyrica BID or TID, She was able to take '75mg'$  HS only. Will continue '75mg'$  HS, she does not use ibuprofen regularly, DC ibuprofen- start naproxen '250mg'$  BID for 4 weeks, order lidocaine cream

## 2022-10-05 ENCOUNTER — Ambulatory Visit: Payer: 59 | Admitting: Physical Therapy

## 2022-10-05 ENCOUNTER — Encounter: Payer: Self-pay | Admitting: Physical Therapy

## 2022-10-05 DIAGNOSIS — M25572 Pain in left ankle and joints of left foot: Secondary | ICD-10-CM

## 2022-10-05 DIAGNOSIS — R2689 Other abnormalities of gait and mobility: Secondary | ICD-10-CM | POA: Diagnosis not present

## 2022-10-05 DIAGNOSIS — R202 Paresthesia of skin: Secondary | ICD-10-CM

## 2022-10-05 NOTE — Therapy (Signed)
OUTPATIENT PHYSICAL THERAPY TREATMENT NOTE   Patient Name: Caitlin Bryant MRN: [id number] DOB:1981/07/26, 42 y.o., female Today's Date: 10/05/2022  PCP: Ronnell Freshwater, NP  REFERRING PROVIDER: Jennye Boroughs, MD   END OF SESSION:   PT End of Session - 10/05/22 1506     Visit Number 6    Number of Visits 13    Date for PT Re-Evaluation 11/12/22    Authorization Type [employer]    Authorization - Number of Visits 30    PT Start Time 1504    Activity Tolerance Patient limited by pain;No increased pain    Behavior During Therapy WFL for tasks assessed/performed              Past Medical History:  Diagnosis Date   Anxiety    Headache    MRSA (methicillin resistant staph aureus) culture positive    5 or more years ago, right thumb   Past Surgical History:  Procedure Laterality Date   ANKLE ARTHROSCOPY WITH RECONSTRUCTION Left 08/19/2021   Procedure: LEFT ANKLE ARTHROSCOPY, ARTHROSCOPIC TREATMENT OF TALUS OSTEOCHONDRAL LESION, LATERAL LIGAMENT RECONSTRUCTION, PERONEAL TENDON DEBRIDEMENT, REPAIR OF DISLOCATING PERONEAL TENDONS;  Surgeon: Erle Crocker, MD;  Location: [place];  Service: Orthopedics;  Laterality: Left;  LENGTH OF SURGERY: 120 MINUTES   CESAREAN SECTION  2004 and 2008   x 2   DIAGNOSTIC LAPAROSCOPY     TONSILLECTOMY     Patient Active Problem List   Diagnosis Date Noted   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 05/24/2022   Left peroneal nerve injury 05/24/2022   Cutaneous candidiasis 05/24/2022   BMI 33.0-33.9,adult 09/28/2021   Left foot pain 09/28/2021   Overweight with body mass index (BMI) 25.0-29.9 06/25/2021   Acute right-sided low back pain with right-sided sciatica 06/15/2021   Hip pain 06/15/2021   Disorder of lumbosacral intervertebral disc 06/15/2021   Chronic pain of left ankle 06/08/2021   Routine general medical examination at a health care facility 03/17/2021   Encounter to establish care  02/23/2021   Other fatigue 02/23/2021   Viral upper respiratory infection 12/04/2020   Acute bilateral low back pain without sciatica 06/09/2019   Sore throat 06/09/2018   Left knee pain 04/20/2018   Impacted cerumen of right ear 04/15/2017   Acute non-recurrent pansinusitis 10/09/2016   Neck swelling 08/07/2016   Mouth sores 07/24/2015   Depression 07/24/2015   TMJ dysfunction 07/03/2015   Acute confusional migraine 05/10/2015   Ankle instability, left 02/22/2015   Generalized anxiety disorder 01/31/2015   Allergic rhinitis 01/31/2015    REFERRING DIAG: [id number] (ICD-10-CM) - Complex regional pain syndrome type 1 of left lower extremity   THERAPY DIAG:  No diagnosis found.  Rationale for Evaluation and Treatment Rehabilitation  PERTINENT HISTORY: multiple MSK issues, migraines, anxiety   PRECAUTIONS: None  SUBJECTIVE:  SUBJECTIVE STATEMENT:   " I've bee doing okay since the last session, the shoes still give me some issue from time to time."  PAIN:  Are you having pain: yes 3/10 Location: L hip, knee, ankle, foot, and toes How would you describe your pain? Burning, shooting, tingling Best in past week: 4/10 Worst in past week: 8/10 Aggravating factors: WB, movement, touch, difficulty sleeping due to positioning  Easing factors: positioning (especially dependent positioning)    OBJECTIVE: (objective measures completed at initial evaluation unless otherwise dated)   DIAGNOSTIC FINDINGS:  Pre surgical MRI showing damaged cartilage and loosened ligaments, pt reports multiple EMGs and other imaging since surgery. Unable to view these in epic. Ankle scope Dec 2022. Per referral pt with suspected CRPS.    PATIENT SURVEYS:  FOTO 34% current, 56% predicted   10/05/2022 41%  COGNITION: Overall  cognitive status: Within functional limits for tasks assessed                         SENSATION: Globally reduced sensation distal to knee - tingling to light touch dorsal L foot, hallux, and lateral foot With rough towel, increased tingling lateral foot and dorsal foot compared to light touch Increased burning medial calf and toes after sensation testing   EDEMA:  Mild edema throughout L foot dorsally and achilles tendon     PALPATION: TTP throughout ankle, foot, and lower limb globally    LOWER EXTREMITY ROM:    Active ROM Right eval Left eval  Knee flexion      Knee extension      Ankle dorsiflexion 10 2  Ankle plantarflexion   46  Ankle inversion 50 32  Ankle eversion 35 25   (Blank rows = not tested) (Key: WFL = within functional limits not formally assessed, * = concordant pain, s = stiffness/stretching sensation, NT = not tested)  Comments:     LOWER EXTREMITY MMT:   MMT Right eval Left eval  Knee flexion      Knee extension      Ankle dorsiflexion      Ankle plantarflexion      Ankle inversion      Ankle eversion       (Blank rows = not tested) Comments:     (Blank rows = not tested) (Key: WFL = within functional limits not formally assessed, * = concordant pain, s = stiffness/stretching sensation, NT = not tested)  Comments:       FUNCTIONAL TESTS:  Sit to stand: B UE support from standard surface, inc WB through RLE    GAIT: Distance walked: within clinic  Assistive device utilized: None Level of assistance: Complete Independence Comments: antalgic gait LLE, reduced truncal rotation B, partial step through pattern leading with RLE     TODAY'S TREATMENT:             [facility] Adult PT Treatment:                                                DATE: 10/05/2022 Therapeutic Exercise: Towel inversion/ eversion 2 x 10 Towel scrunch x 5 Toe yoga 2 x 10 Rocker board 1 x 20 Manual Therapy: Talocrural distraction grade II-III mobs Inter-tarsal  glides Desensitization with pillow case on distal aspect of dorsum of foot    [facility] Adult PT Treatment:  DATE: 10/01/22 Therapeutic Exercise: AP rocker board 3x10 cues for form and pacing  Seated inversion/eversion towel slides 2x10 cues for control and comfortable ROM Toe extension 2x8 minimal WB cues for tripod foot and pacing Standing weight shifting AP/lat (pain in calf and big toe) Neuromuscular re-ed: Desensitization techniques; long sitting LLE Pillowcase; beginning proximally at knee moving distally, longitudinally medial, central, and lateral down to toes. 5 laps Glove; method as above, 3 laps Rough towel; method as above, 2 laps  [facility] Adult PT Treatment:                                                DATE: 09/29/22 Therapeutic Exercise: AP rocker board  3x10 cues for form and pacing  Seated inversion/eversion towel slides 2x10 cues for control and comfortable ROM Toe extension 2x8 minimal WB cues for tripod foot and pacing  Neuromuscular re-ed: PNE, relevant anatomy/physiology Desensitization techniques; long sitting LLE Pillowcase; beginning proximally at knee moving distally, longitudinally medial, central, and lateral down to toes. 5 laps Glove; method as above, 3 laps Rough towel; method as above, 2 laps  Therapeutic Activity: Activity modification, pacing of activities, monitoring of symptoms as they relate to functional mobility   [facility] Adult PT Treatment:                                                DATE: 09/24/22 Therapeutic Exercise: Ankle pumps 2x10 Ankle circles x10 CW/CCW Seated rocker board PF/DF/, inv/ev 2x1' Manual Therapy: Desensitization techniques - light touch, gloves, pillowcase, towel (tingling most with towel, no pain, describes as "weird")     PATIENT EDUCATION:  Education details: rationale for interventions, relevant anatomy/physiology, PNE Person educated: Patient Education method: Explanation,  Demonstration, Tactile cues, Verbal cues Education comprehension: verbalized understanding, returned demonstration, verbal cues required, tactile cues required, and needs further education     HOME EXERCISE PROGRAM:  Access Code: [email or web address] URL: [email or web address] Date: 10/05/2022 Prepared by: Starr Lake  Exercises - Ankle Inversion Eversion Towel Slide  - 1 x daily - 7 x weekly - 2 sets - 10 reps - Seated Heel Toe Raises  - 1 x daily - 7 x weekly - 2 sets - 10 reps - Toe Yoga - Alternating Great Toe and Lesser Toe Extension  - 1 x daily - 7 x weekly - 2 sets - 10 reps - Towel Scrunches  - 1 x daily - 7 x weekly - 2 sets - 10 reps  Patient Education - Rubbing with Different Textures  ASSESSMENT:   CLINICAL IMPRESSION: Pt arrives to PT noting continued hypersensitivity/ pain rated at 4/10 today. Continued utilizing desensitization techniques followed with talocrural and intertarsal glides to promote mobility. She did very well with exercises with the most challenging being toe yoga. Discussed doing exercises on both sides in unison to maximize benefit and reciprocal feedback. She increased her FOTO 46 which is an improvement from her initial assessment of 20.   OBJECTIVE IMPAIRMENTS: Abnormal gait, decreased activity tolerance, decreased balance, decreased endurance, decreased mobility, difficulty walking, decreased ROM, decreased strength, hypomobility, increased edema, impaired perceived functional ability, impaired sensation, postural dysfunction, and pain.    ACTIVITY LIMITATIONS: carrying, lifting, bending, sitting, standing, squatting, sleeping, stairs, transfers, and locomotion level  PARTICIPATION LIMITATIONS: meal prep, cleaning, laundry, driving, shopping, community activity, and occupation   PERSONAL FACTORS: Time since onset of injury/illness/exacerbation and 1 comorbidity: anxiety/depression  are also affecting patient's functional outcome.    REHAB  POTENTIAL: Fair given anatomy/physiology of condition and comorbidity   CLINICAL DECISION MAKING: Evolving/moderate complexity   EVALUATION COMPLEXITY: Moderate     GOALS: Goals reviewed with patient? No   SHORT TERM GOALS: Target date: 10/15/2022     Pt will demonstrate appropriate understanding and performance of initially prescribed HEP in order to facilitate improved independence with management of symptoms.  Baseline: HEP provided on eval Goal status: INITIAL    2. Pt will score greater than or equal to 44% on FOTO in order to demonstrate improved perception of function due to symptoms.            Baseline: 34%            Goal status: INITIAL      LONG TERM GOALS: Target date: 11/12/2022    Pt will score 56% or greater on FOTO in order to demonstrate improved perception of function due to symptoms.  Baseline: 34% Goal status: INITIAL   2.  Pt will demonstrate at least 10 degrees of L ankle DF AROM in order to facilitate improved tolerance to functional movements such as transfers/gait.  Baseline: see ROM chart above Goal status: INITIAL   3.  Pt will be able to perform floor transfer w/ less than 2pt increase in resting pain in order to promote increased tolerance to work activities.  Baseline: unable to perform  Goal status: INITIAL   4.  Pt will be able to perform sit to stand transfer without UE support and less than 2pt increase in resting pain to improve safety w/ transfers.  Baseline: B UE support and altered mechanics Goal status: INITIAL      PLAN:   PT FREQUENCY: 2x/week   PT DURATION: 8 weeks   PLANNED INTERVENTIONS: Therapeutic exercises, Therapeutic activity, Neuromuscular re-education, Balance training, Gait training, Patient/Family education, Self Care, Joint mobilization, Stair training, Aquatic Therapy, Electrical stimulation, Cryotherapy, Moist heat, Taping, Manual therapy, and Re-evaluation   PLAN FOR NEXT SESSION: pending response to today's  session, consider addition of towel eversion/inversion and seated heel/toe raise to HEP. Continue desensitization techniques as able/appropriate    Keirstan Iannello PT, DPT, LAT, ATC  10/05/22  4:00 PM

## 2022-10-07 ENCOUNTER — Ambulatory Visit: Payer: 59

## 2022-10-07 DIAGNOSIS — R202 Paresthesia of skin: Secondary | ICD-10-CM

## 2022-10-07 DIAGNOSIS — R2689 Other abnormalities of gait and mobility: Secondary | ICD-10-CM

## 2022-10-07 DIAGNOSIS — M25572 Pain in left ankle and joints of left foot: Secondary | ICD-10-CM

## 2022-10-07 NOTE — Therapy (Signed)
OUTPATIENT PHYSICAL THERAPY TREATMENT NOTE   Patient Name: Caitlin Bryant MRN: [id number] DOB:Jul 30, 1981, 42 y.o., female Today's Date: 10/07/2022  PCP: Ronnell Freshwater, NP  REFERRING PROVIDER: Jennye Boroughs, MD   END OF SESSION:   PT End of Session - 10/07/22 1134     Visit Number 7    Number of Visits 13    Date for PT Re-Evaluation 11/12/22    Authorization Type [employer]    Authorization - Visit Number 6    Authorization - Number of Visits 30    PT Start Time 1133    PT Stop Time 1215    PT Time Calculation (min) 42 min    Activity Tolerance Patient limited by pain;No increased pain    Behavior During Therapy WFL for tasks assessed/performed              Past Medical History:  Diagnosis Date   Anxiety    Headache    MRSA (methicillin resistant staph aureus) culture positive    5 or more years ago, right thumb   Past Surgical History:  Procedure Laterality Date   ANKLE ARTHROSCOPY WITH RECONSTRUCTION Left 08/19/2021   Procedure: LEFT ANKLE ARTHROSCOPY, ARTHROSCOPIC TREATMENT OF TALUS OSTEOCHONDRAL LESION, LATERAL LIGAMENT RECONSTRUCTION, PERONEAL TENDON DEBRIDEMENT, REPAIR OF DISLOCATING PERONEAL TENDONS;  Surgeon: Erle Crocker, MD;  Location: [facility];  Service: Orthopedics;  Laterality: Left;  LENGTH OF SURGERY: 120 MINUTES   CESAREAN SECTION  2004 and 2008   x 2   DIAGNOSTIC LAPAROSCOPY     TONSILLECTOMY     Patient Active Problem List   Diagnosis Date Noted   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 05/24/2022   Left peroneal nerve injury 05/24/2022   Cutaneous candidiasis 05/24/2022   BMI 33.0-33.9,adult 09/28/2021   Left foot pain 09/28/2021   Overweight with body mass index (BMI) 25.0-29.9 06/25/2021   Acute right-sided low back pain with right-sided sciatica 06/15/2021   Hip pain 06/15/2021   Disorder of lumbosacral intervertebral disc 06/15/2021   Chronic pain of left ankle 06/08/2021   Routine  general medical examination at a health care facility 03/17/2021   Encounter to establish care 02/23/2021   Other fatigue 02/23/2021   Viral upper respiratory infection 12/04/2020   Acute bilateral low back pain without sciatica 06/09/2019   Sore throat 06/09/2018   Left knee pain 04/20/2018   Impacted cerumen of right ear 04/15/2017   Acute non-recurrent pansinusitis 10/09/2016   Neck swelling 08/07/2016   Mouth sores 07/24/2015   Depression 07/24/2015   TMJ dysfunction 07/03/2015   Acute confusional migraine 05/10/2015   Ankle instability, left 02/22/2015   Generalized anxiety disorder 01/31/2015   Allergic rhinitis 01/31/2015    REFERRING DIAG: [id number] (ICD-10-CM) - Complex regional pain syndrome type 1 of left lower extremity   THERAPY DIAG:  Pain in left ankle and joints of left foot  Other abnormalities of gait and mobility  Paresthesia of skin  Rationale for Evaluation and Treatment Rehabilitation  PERTINENT HISTORY: multiple MSK issues, migraines, anxiety   PRECAUTIONS: None  SUBJECTIVE:  SUBJECTIVE STATEMENT:   Patient reports continued pain and tingling.   PAIN:  Are you having pain: yes 5/10 Location: L hip, knee, ankle, foot, and toes How would you describe your pain? Burning, shooting, tingling Best in past week: 4/10 Worst in past week: 8/10 Aggravating factors: WB, movement, touch, difficulty sleeping due to positioning  Easing factors: positioning (especially dependent positioning)    OBJECTIVE: (objective measures completed at initial evaluation unless otherwise dated)   DIAGNOSTIC FINDINGS:  Pre surgical MRI showing damaged cartilage and loosened ligaments, pt reports multiple EMGs and other imaging since surgery. Unable to view these in epic. Ankle scope Dec 2022. Per  referral pt with suspected CRPS.    PATIENT SURVEYS:  FOTO 34% current, 56% predicted   10/05/2022 41%  COGNITION: Overall cognitive status: Within functional limits for tasks assessed                         SENSATION: Globally reduced sensation distal to knee - tingling to light touch dorsal L foot, hallux, and lateral foot With rough towel, increased tingling lateral foot and dorsal foot compared to light touch Increased burning medial calf and toes after sensation testing   EDEMA:  Mild edema throughout L foot dorsally and achilles tendon     PALPATION: TTP throughout ankle, foot, and lower limb globally    LOWER EXTREMITY ROM:    Active ROM Right eval Left eval  Knee flexion      Knee extension      Ankle dorsiflexion 10 2  Ankle plantarflexion   46  Ankle inversion 50 32  Ankle eversion 35 25   (Blank rows = not tested) (Key: WFL = within functional limits not formally assessed, * = concordant pain, s = stiffness/stretching sensation, NT = not tested)  Comments:     LOWER EXTREMITY MMT:   MMT Right eval Left eval  Knee flexion      Knee extension      Ankle dorsiflexion      Ankle plantarflexion      Ankle inversion      Ankle eversion       (Blank rows = not tested) Comments:     (Blank rows = not tested) (Key: WFL = within functional limits not formally assessed, * = concordant pain, s = stiffness/stretching sensation, NT = not tested)  Comments:       FUNCTIONAL TESTS:  Sit to stand: B UE support from standard surface, inc WB through RLE    GAIT: Distance walked: within clinic  Assistive device utilized: None Level of assistance: Complete Independence Comments: antalgic gait LLE, reduced truncal rotation B, partial step through pattern leading with RLE     TODAY'S TREATMENT:     [facility] Adult PT Treatment:                                                DATE: 10/07/22 Therapeutic Exercise: Towel inversion/ eversion x1' in shoes, out of shoes  x1' Towel scrunch x 5 Toe yoga 2 x 10 (difficult, able to do easier with Rt too) Rocker board PF/DF, inv/ev x1' each Seated heel/toe raise 2x10 Self Care: Minimization of body impairments - "bad leg" to neutral term "left leg" Activity pacing, activity modification           [facility] Adult PT  Treatment:                                                DATE: 10/05/2022 Therapeutic Exercise: Towel inversion/ eversion 2 x 10 Towel scrunch x 5 Toe yoga 2 x 10 Rocker board 1 x 20 Manual Therapy: Talocrural distraction grade II-III mobs Inter-tarsal glides Desensitization with pillow case on distal aspect of dorsum of foot    [facility] Adult PT Treatment:                                                DATE: 10/01/22 Therapeutic Exercise: AP rocker board 3x10 cues for form and pacing  Seated inversion/eversion towel slides 2x10 cues for control and comfortable ROM Toe extension 2x8 minimal WB cues for tripod foot and pacing Standing weight shifting AP/lat (pain in calf and big toe) Neuromuscular re-ed: Desensitization techniques; long sitting LLE Pillowcase; beginning proximally at knee moving distally, longitudinally medial, central, and lateral down to toes. 5 laps Glove; method as above, 3 laps Rough towel; method as above, 2 laps    PATIENT EDUCATION:  Education details: rationale for interventions, relevant anatomy/physiology, PNE Person educated: Patient Education method: Explanation, Demonstration, Tactile cues, Verbal cues Education comprehension: verbalized understanding, returned demonstration, verbal cues required, tactile cues required, and needs further education     HOME EXERCISE PROGRAM:  Access Code: [email or web address] URL: [email or web address] Date: 10/05/2022 Prepared by: Starr Lake  Exercises - Ankle Inversion Eversion Towel Slide  - 1 x daily - 7 x weekly - 2 sets - 10 reps - Seated Heel Toe Raises  - 1 x daily - 7 x weekly - 2 sets - 10 reps - Toe Yoga  - Alternating Great Toe and Lesser Toe Extension  - 1 x daily - 7 x weekly - 2 sets - 10 reps - Towel Scrunches  - 1 x daily - 7 x weekly - 2 sets - 10 reps  Patient Education - Rubbing with Different Textures  ASSESSMENT:   CLINICAL IMPRESSION: Patient presents to PT reporting increased soreness from last session. Session today continued to focus on exercises for ankle and toe mobility/desensitization. Increased time today spent discussing activity pacing and modification as well as minimization of body impairments by changing verbiage from "bad leg" to neutral term "left leg." She expresses difficulty with these as her pain has been minimized for a long time by healthcare system and loved ones. Encouraged patient to focus on neutral terms for the LLE and to modify activities as needed for the time being. Patient continues to benefit from skilled PT services and should be progressed as able to improve functional independence.    OBJECTIVE IMPAIRMENTS: Abnormal gait, decreased activity tolerance, decreased balance, decreased endurance, decreased mobility, difficulty walking, decreased ROM, decreased strength, hypomobility, increased edema, impaired perceived functional ability, impaired sensation, postural dysfunction, and pain.    ACTIVITY LIMITATIONS: carrying, lifting, bending, sitting, standing, squatting, sleeping, stairs, transfers, and locomotion level   PARTICIPATION LIMITATIONS: meal prep, cleaning, laundry, driving, shopping, community activity, and occupation   PERSONAL FACTORS: Time since onset of injury/illness/exacerbation and 1 comorbidity: anxiety/depression  are also affecting patient's functional outcome.    REHAB POTENTIAL: Fair given anatomy/physiology of condition and comorbidity   CLINICAL  DECISION MAKING: Evolving/moderate complexity   EVALUATION COMPLEXITY: Moderate     GOALS: Goals reviewed with patient? No   SHORT TERM GOALS: Target date: 10/15/2022     Pt  will demonstrate appropriate understanding and performance of initially prescribed HEP in order to facilitate improved independence with management of symptoms.  Baseline: HEP provided on eval Goal status: INITIAL    2. Pt will score greater than or equal to 44% on FOTO in order to demonstrate improved perception of function due to symptoms.            Baseline: 34%            Goal status: INITIAL      LONG TERM GOALS: Target date: 11/12/2022    Pt will score 56% or greater on FOTO in order to demonstrate improved perception of function due to symptoms.  Baseline: 34% Goal status: INITIAL   2.  Pt will demonstrate at least 10 degrees of L ankle DF AROM in order to facilitate improved tolerance to functional movements such as transfers/gait.  Baseline: see ROM chart above Goal status: INITIAL   3.  Pt will be able to perform floor transfer w/ less than 2pt increase in resting pain in order to promote increased tolerance to work activities.  Baseline: unable to perform  Goal status: INITIAL   4.  Pt will be able to perform sit to stand transfer without UE support and less than 2pt increase in resting pain to improve safety w/ transfers.  Baseline: B UE support and altered mechanics Goal status: INITIAL      PLAN:   PT FREQUENCY: 2x/week   PT DURATION: 8 weeks   PLANNED INTERVENTIONS: Therapeutic exercises, Therapeutic activity, Neuromuscular re-education, Balance training, Gait training, Patient/Family education, Self Care, Joint mobilization, Stair training, Aquatic Therapy, Electrical stimulation, Cryotherapy, Moist heat, Taping, Manual therapy, and Re-evaluation   PLAN FOR NEXT SESSION: pending response to today's session, consider addition of towel eversion/inversion and seated heel/toe raise to HEP. Continue desensitization techniques as able/appropriate    Margarette Canada, PTA 10/07/22 12:28 PM

## 2022-10-13 ENCOUNTER — Telehealth: Payer: Self-pay

## 2022-10-13 NOTE — Telephone Encounter (Signed)
Patient is stating that she is unable to take the lyrica 3x a day, 1x a day at bedtime for 75 mg because during the day it makes you feel funny and messes with her head, makes you confused and has her words messed up,  she is wondering if you might want to prescribe her something else.

## 2022-10-13 NOTE — Therapy (Signed)
OUTPATIENT PHYSICAL THERAPY TREATMENT NOTE   Patient Name: Caitlin Bryant MRN: [id number] DOB:1981/01/13, 42 y.o., female Today's Date: 10/14/2022  PCP: Ronnell Freshwater, NP  REFERRING PROVIDER: Jennye Boroughs, MD   END OF SESSION:   PT End of Session - 10/14/22 1102     Visit Number 8    Number of Visits 13    Date for PT Re-Evaluation 11/12/22    Authorization Type Aetna    Authorization - Visit Number 8    Authorization - Number of Visits 30    PT Start Time [id number]    PT Stop Time 1147    PT Time Calculation (min) 44 min    Activity Tolerance Patient limited by pain;No increased pain    Behavior During Therapy WFL for tasks assessed/performed               Past Medical History:  Diagnosis Date   Anxiety    Headache    MRSA (methicillin resistant staph aureus) culture positive    5 or more years ago, right thumb   Past Surgical History:  Procedure Laterality Date   ANKLE ARTHROSCOPY WITH RECONSTRUCTION Left 08/19/2021   Procedure: LEFT ANKLE ARTHROSCOPY, ARTHROSCOPIC TREATMENT OF TALUS OSTEOCHONDRAL LESION, LATERAL LIGAMENT RECONSTRUCTION, PERONEAL TENDON DEBRIDEMENT, REPAIR OF DISLOCATING PERONEAL TENDONS;  Surgeon: Erle Crocker, MD;  Location: [facility];  Service: Orthopedics;  Laterality: Left;  LENGTH OF SURGERY: 120 MINUTES   CESAREAN SECTION  2004 and 2008   x 2   DIAGNOSTIC LAPAROSCOPY     TONSILLECTOMY     Patient Active Problem List   Diagnosis Date Noted   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 05/24/2022   Left peroneal nerve injury 05/24/2022   Cutaneous candidiasis 05/24/2022   BMI 33.0-33.9,adult 09/28/2021   Left foot pain 09/28/2021   Overweight with body mass index (BMI) 25.0-29.9 06/25/2021   Acute right-sided low back pain with right-sided sciatica 06/15/2021   Hip pain 06/15/2021   Disorder of lumbosacral intervertebral disc 06/15/2021   Chronic pain of left ankle 06/08/2021   Routine  general medical examination at a health care facility 03/17/2021   Encounter to establish care 02/23/2021   Other fatigue 02/23/2021   Viral upper respiratory infection 12/04/2020   Acute bilateral low back pain without sciatica 06/09/2019   Sore throat 06/09/2018   Left knee pain 04/20/2018   Impacted cerumen of right ear 04/15/2017   Acute non-recurrent pansinusitis 10/09/2016   Neck swelling 08/07/2016   Mouth sores 07/24/2015   Depression 07/24/2015   TMJ dysfunction 07/03/2015   Acute confusional migraine 05/10/2015   Ankle instability, left 02/22/2015   Generalized anxiety disorder 01/31/2015   Allergic rhinitis 01/31/2015    REFERRING DIAG: [id number] (ICD-10-CM) - Complex regional pain syndrome type 1 of left lower extremity   THERAPY DIAG:  Pain in left ankle and joints of left foot  Other abnormalities of gait and mobility  Paresthesia of skin  Rationale for Evaluation and Treatment Rehabilitation  PERTINENT HISTORY: multiple MSK issues, migraines, anxiety   PRECAUTIONS: None  SUBJECTIVE:  SUBJECTIVE STATEMENT:   Pt states she was doing well after last session until her dog got loose and she had to chase after it which increased pain. Good adherence w/ HEP reported  PAIN:  Are you having pain: yes 3/10 Location: L hip, knee, ankle, foot, and toes How would you describe your pain? Burning, shooting, tingling Best in past week: 3/10 (4/10 on eval) Worst in past week: 8/10 Aggravating factors: WB, movement, touch, difficulty sleeping due to positioning  Easing factors: positioning (especially dependent positioning)    OBJECTIVE: (objective measures completed at initial evaluation unless otherwise dated)   DIAGNOSTIC FINDINGS:  Pre surgical MRI showing damaged cartilage and loosened  ligaments, pt reports multiple EMGs and other imaging since surgery. Unable to view these in epic. Ankle scope Dec 2022. Per referral pt with suspected CRPS.    PATIENT SURVEYS:  FOTO 34% current, 56% predicted   10/05/2022 41%  COGNITION: Overall cognitive status: Within functional limits for tasks assessed                         SENSATION: Globally reduced sensation distal to knee - tingling to light touch dorsal L foot, hallux, and lateral foot With rough towel, increased tingling lateral foot and dorsal foot compared to light touch Increased burning medial calf and toes after sensation testing   EDEMA:  Mild edema throughout L foot dorsally and achilles tendon     PALPATION: TTP throughout ankle, foot, and lower limb globally    LOWER EXTREMITY ROM:    Active ROM Right eval Left eval  Knee flexion      Knee extension      Ankle dorsiflexion 10 2  Ankle plantarflexion   46  Ankle inversion 50 32  Ankle eversion 35 25   (Blank rows = not tested) (Key: WFL = within functional limits not formally assessed, * = concordant pain, s = stiffness/stretching sensation, NT = not tested)  Comments:     LOWER EXTREMITY MMT:   MMT Right eval Left eval  Knee flexion      Knee extension      Ankle dorsiflexion      Ankle plantarflexion      Ankle inversion      Ankle eversion       (Blank rows = not tested) Comments:     (Blank rows = not tested) (Key: WFL = within functional limits not formally assessed, * = concordant pain, s = stiffness/stretching sensation, NT = not tested)  Comments:       FUNCTIONAL TESTS:  Sit to stand: B UE support from standard surface, inc WB through RLE    GAIT: Distance walked: within clinic  Assistive device utilized: None Level of assistance: Complete Independence Comments: antalgic gait LLE, reduced truncal rotation B, partial step through pattern leading with RLE     TODAY'S TREATMENT:     [facility] Adult PT Treatment:                                                 DATE: 10/14/22 Therapeutic Exercise: Toe yoga 5 repetitions cues for form, pacing Inversion/eversion towel slides barefoot x25 cues for pacing  Seated heel/toe raises x25 cues for pacing and setup Long sitting ankle circles x12 B LE simultaneously cues for control and ROM  Neuromuscular re-ed: Desensitization  techniques; long sitting LLE Pillowcase; beginning proximally at knee moving distally, longitudinally medial, central, and lateral down to toes. 5 laps Glove; method as above, 5 laps  Therapeutic Activity: Discussion/education regarding activity modification, pacing of activities, monitoring of symptoms, relevant anatomy/physiology and PNE as it pertains to daily function   [facility] Adult PT Treatment:                                                DATE: 10/07/22 Therapeutic Exercise: Towel inversion/ eversion x1' in shoes, out of shoes x1' Towel scrunch x 5 Toe yoga 2 x 10 (difficult, able to do easier with Rt too) Rocker board PF/DF, inv/ev x1' each Seated heel/toe raise 2x10 Self Care: Minimization of body impairments - "bad leg" to neutral term "left leg" Activity pacing, activity modification           [facility] Adult PT Treatment:                                                DATE: 10/05/2022 Therapeutic Exercise: Towel inversion/ eversion 2 x 10 Towel scrunch x 5 Toe yoga 2 x 10 Rocker board 1 x 20 Manual Therapy: Talocrural distraction grade II-III mobs Inter-tarsal glides Desensitization with pillow case on distal aspect of dorsum of foot  PATIENT EDUCATION:  Education details: rationale for interventions, relevant anatomy/physiology, PNE, activity modification/pacing, HEP Person educated: Patient Education method: Explanation, Demonstration, Tactile cues, Verbal cues, handout Education comprehension: verbalized understanding, returned demonstration, verbal cues required, tactile cues required, and needs further education     HOME EXERCISE  PROGRAM:  Access Code: [email or web address] URL: [email or web address] Date: 10/14/2022 Prepared by: Enis Slipper  Exercises - Seated Heel Toe Raises  - 1 x daily - 7 x weekly - 2 sets - 10 reps - Towel Scrunches  - 1 x daily - 7 x weekly - 2 sets - 10 reps - Supine Ankle Circles  - 1 x daily - 7 x weekly - 2 sets - 10 reps  Patient Education - Rubbing with Different Textures  ASSESSMENT:   CLINICAL IMPRESSION: Pt arrives w/ 3/10 pain despite exacerbation last week after having to chase her dog. Continued to emphasize comfortable ROM with therex, requires increased time/effort and rest breaks for activities due to fatigue/difficulty but no overt increase in pain. Also spent significant time w/ education as described above, PNE and relevant anatomy/physiology. Also educated on possible benefit of modifying setting for performance of HEP to allow for more comfortable/relaxing environment (calming music, comfort TV shows, etc) in order to mitigate upregulation of sympathetic nervous system with activity. No adverse events, pt endorses tingling in limb with desensitization but no overt increase in pain. Pt departs today's session in no acute distress, all voiced questions/concerns addressed appropriately from PT perspective.     OBJECTIVE IMPAIRMENTS: Abnormal gait, decreased activity tolerance, decreased balance, decreased endurance, decreased mobility, difficulty walking, decreased ROM, decreased strength, hypomobility, increased edema, impaired perceived functional ability, impaired sensation, postural dysfunction, and pain.    ACTIVITY LIMITATIONS: carrying, lifting, bending, sitting, standing, squatting, sleeping, stairs, transfers, and locomotion level   PARTICIPATION LIMITATIONS: meal prep, cleaning, laundry, driving, shopping, community activity, and occupation   PERSONAL FACTORS: Time since onset of injury/illness/exacerbation and 1  comorbidity: anxiety/depression  are also  affecting patient's functional outcome.    REHAB POTENTIAL: Fair given anatomy/physiology of condition and comorbidity   CLINICAL DECISION MAKING: Evolving/moderate complexity   EVALUATION COMPLEXITY: Moderate     GOALS: Goals reviewed with patient? No   SHORT TERM GOALS: Target date: 10/15/2022     Pt will demonstrate appropriate understanding and performance of initially prescribed HEP in order to facilitate improved independence with management of symptoms.  Baseline: HEP provided on eval 10/14/22: reports good compliance w/ HEP Goal status: MET   2. Pt will score greater than or equal to 44% on FOTO in order to demonstrate improved perception of function due to symptoms.            Baseline: 34%  10/05/22 41%            Goal status: ONGOING     LONG TERM GOALS: Target date: 11/12/2022    Pt will score 56% or greater on FOTO in order to demonstrate improved perception of function due to symptoms.  Baseline: 34% Goal status: INITIAL   2.  Pt will demonstrate at least 10 degrees of L ankle DF AROM in order to facilitate improved tolerance to functional movements such as transfers/gait.  Baseline: see ROM chart above Goal status: INITIAL   3.  Pt will be able to perform floor transfer w/ less than 2pt increase in resting pain in order to promote increased tolerance to work activities.  Baseline: unable to perform  Goal status: INITIAL   4.  Pt will be able to perform sit to stand transfer without UE support and less than 2pt increase in resting pain to improve safety w/ transfers.  Baseline: B UE support and altered mechanics Goal status: INITIAL      PLAN:   PT FREQUENCY: 2x/week   PT DURATION: 8 weeks   PLANNED INTERVENTIONS: Therapeutic exercises, Therapeutic activity, Neuromuscular re-education, Balance training, Gait training, Patient/Family education, Self Care, Joint mobilization, Stair training, Aquatic Therapy, Electrical stimulation, Cryotherapy, Moist heat,  Taping, Manual therapy, and Re-evaluation   PLAN FOR NEXT SESSION: review/update HEP. Continue desensitization as able/appropriate . Try to do more activity in WB as able/appropriate    Leeroy Cha PT, DPT 10/14/2022 11:49 AM

## 2022-10-14 ENCOUNTER — Encounter: Payer: Self-pay | Admitting: Physical Therapy

## 2022-10-14 ENCOUNTER — Ambulatory Visit: Payer: 59 | Admitting: Physical Therapy

## 2022-10-14 DIAGNOSIS — R202 Paresthesia of skin: Secondary | ICD-10-CM

## 2022-10-14 DIAGNOSIS — R2689 Other abnormalities of gait and mobility: Secondary | ICD-10-CM

## 2022-10-14 DIAGNOSIS — M25572 Pain in left ankle and joints of left foot: Secondary | ICD-10-CM | POA: Diagnosis not present

## 2022-10-16 ENCOUNTER — Ambulatory Visit: Payer: 59

## 2022-10-16 MED ORDER — LIDOCAINE 4 % EX CREA: 1.0000 | TOPICAL_CREAM | CUTANEOUS | 1 refills | Status: DC | PRN

## 2022-10-16 MED ORDER — PREGABALIN 75 MG PO CAPS: 75.0000 mg | ORAL_CAPSULE | Freq: Every day | ORAL | 2 refills | Status: DC

## 2022-10-16 MED ORDER — NAPROXEN 250 MG PO TABS: 250.0000 mg | ORAL_TABLET | Freq: Two times a day (BID) | ORAL | 0 refills | Status: DC

## 2022-10-16 NOTE — Addendum Note (Signed)
Addended by: Jennye Boroughs on: 10/16/2022 05:31 PM   Modules accepted: Orders

## 2022-10-20 ENCOUNTER — Telehealth: Payer: Self-pay

## 2022-10-20 NOTE — Telephone Encounter (Signed)
PA for Pregabalin submitted.

## 2022-10-26 ENCOUNTER — Other Ambulatory Visit: Payer: Self-pay | Admitting: Nurse Practitioner

## 2022-10-26 DIAGNOSIS — F411 Generalized anxiety disorder: Secondary | ICD-10-CM

## 2022-10-27 NOTE — Telephone Encounter (Signed)
Your PA request has been closed. PREGABALIN '75MG'$  CAP - No PA required. Refill too soon. PT has paid claim on (10/02/22). Test claim pays on next fill date of (10/26/22) and 90 days out

## 2022-10-27 NOTE — Therapy (Signed)
OUTPATIENT PHYSICAL THERAPY TREATMENT NOTE   Patient Name: Caitlin Bryant MRN: [id number] DOB:1981-02-17, 42 y.o., female Today's Date: 10/28/2022  PCP: Ronnell Freshwater, NP  REFERRING PROVIDER: Jennye Boroughs, MD   END OF SESSION:   PT End of Session - 10/28/22 1108     Visit Number 9    Number of Visits 13    Date for PT Re-Evaluation 11/12/22    Authorization Type [employer]    Authorization - Visit Number 9    Authorization - Number of Visits 30    PT Start Time [id number]   late check in   PT Stop Time 1145    PT Time Calculation (min) 36 min    Activity Tolerance Patient limited by pain;No increased pain    Behavior During Therapy WFL for tasks assessed/performed                Past Medical History:  Diagnosis Date   Anxiety    Headache    MRSA (methicillin resistant staph aureus) culture positive    5 or more years ago, right thumb   Past Surgical History:  Procedure Laterality Date   ANKLE ARTHROSCOPY WITH RECONSTRUCTION Left 08/19/2021   Procedure: LEFT ANKLE ARTHROSCOPY, ARTHROSCOPIC TREATMENT OF TALUS OSTEOCHONDRAL LESION, LATERAL LIGAMENT RECONSTRUCTION, PERONEAL TENDON DEBRIDEMENT, REPAIR OF DISLOCATING PERONEAL TENDONS;  Surgeon: Erle Crocker, MD;  Location: [place];  Service: Orthopedics;  Laterality: Left;  LENGTH OF SURGERY: 120 MINUTES   CESAREAN SECTION  2004 and 2008   x 2   DIAGNOSTIC LAPAROSCOPY     TONSILLECTOMY     Patient Active Problem List   Diagnosis Date Noted   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 05/24/2022   Left peroneal nerve injury 05/24/2022   Cutaneous candidiasis 05/24/2022   BMI 34.0-34.9,adult 09/28/2021   Left foot pain 09/28/2021   Overweight with body mass index (BMI) 25.0-29.9 06/25/2021   Acute right-sided low back pain with right-sided sciatica 06/15/2021   Hip pain 06/15/2021   Disorder of lumbosacral intervertebral disc 06/15/2021   Chronic pain of left ankle  06/08/2021   Routine general medical examination at a health care facility 03/17/2021   Encounter to establish care 02/23/2021   Other fatigue 02/23/2021   Viral upper respiratory infection 12/04/2020   Acute bilateral low back pain without sciatica 06/09/2019   Sore throat 06/09/2018   Left knee pain 04/20/2018   Impacted cerumen of right ear 04/15/2017   Acute non-recurrent pansinusitis 10/09/2016   Neck swelling 08/07/2016   Mouth sores 07/24/2015   Depression 07/24/2015   TMJ dysfunction 07/03/2015   Acute confusional migraine 05/10/2015   Ankle instability, left 02/22/2015   Generalized anxiety disorder 01/31/2015   Allergic rhinitis 01/31/2015    REFERRING DIAG: [id number] (ICD-10-CM) - Complex regional pain syndrome type 1 of left lower extremity   THERAPY DIAG:  Pain in left ankle and joints of left foot  Other abnormalities of gait and mobility  Paresthesia of skin  Rationale for Evaluation and Treatment Rehabilitation  PERTINENT HISTORY: multiple MSK issues, migraines, anxiety   PRECAUTIONS: None  SUBJECTIVE:  SUBJECTIVE STATEMENT:   Pt arrives w/ increased symptoms. States on her trip she was able to rest a good bit, but did still have to do a lot of walking and stairs. States that as trip went on she began to have to "force" the walking a bit more, has had increased pain since she's been back. Has had difficulty resting due to life stressors and increased pain.    PAIN:  Are you having pain: yes 8/10 Location: L hip, knee, ankle, foot, and toes How would you describe your pain? Burning, shooting, tingling Best in past week: 3/10 (4/10 on eval) Worst in past week: 8/10 Aggravating factors: WB, movement, touch, difficulty sleeping due to positioning  Easing factors: positioning  (especially dependent positioning)    OBJECTIVE: (objective measures completed at initial evaluation unless otherwise dated)   DIAGNOSTIC FINDINGS:  Pre surgical MRI showing damaged cartilage and loosened ligaments, pt reports multiple EMGs and other imaging since surgery. Unable to view these in epic. Ankle scope Dec 2022. Per referral pt with suspected CRPS.    PATIENT SURVEYS:  FOTO 34% current, 56% predicted   10/05/2022 41%  COGNITION: Overall cognitive status: Within functional limits for tasks assessed                         SENSATION: Globally reduced sensation distal to knee - tingling to light touch dorsal L foot, hallux, and lateral foot With rough towel, increased tingling lateral foot and dorsal foot compared to light touch Increased burning medial calf and toes after sensation testing   EDEMA:  Mild edema throughout L foot dorsally and achilles tendon     PALPATION: TTP throughout ankle, foot, and lower limb globally    LOWER EXTREMITY ROM:    Active ROM Right eval Left eval  Knee flexion      Knee extension      Ankle dorsiflexion 10 2  Ankle plantarflexion   46  Ankle inversion 50 32  Ankle eversion 35 25   (Blank rows = not tested) (Key: WFL = within functional limits not formally assessed, * = concordant pain, s = stiffness/stretching sensation, NT = not tested)  Comments:     LOWER EXTREMITY MMT:   MMT Right eval Left eval  Knee flexion      Knee extension      Ankle dorsiflexion      Ankle plantarflexion      Ankle inversion      Ankle eversion       (Blank rows = not tested) Comments:     (Blank rows = not tested) (Key: WFL = within functional limits not formally assessed, * = concordant pain, s = stiffness/stretching sensation, NT = not tested)  Comments:       FUNCTIONAL TESTS:  Sit to stand: B UE support from standard surface, inc WB through RLE    GAIT: Distance walked: within clinic  Assistive device utilized: None Level of  assistance: Complete Independence Comments: antalgic gait LLE, reduced truncal rotation B, partial step through pattern leading with RLE     TODAY'S TREATMENT:     [facility] Adult PT Treatment:                                                DATE: 10/28/22 Therapeutic Exercise: L3 BAPs circles x10 CW/CCW  Rockerboard AP x20  Toe yoga 2x5 cues for form  Therapeutic Activity: Significant time spent w/ PNE, education/discussion re: symptom behavior over the past couple of weeks, coping strategies, activity modification in midst of exacerbation   [facility] Adult PT Treatment:                                                DATE: 10/14/22 Therapeutic Exercise: Toe yoga 5 repetitions cues for form, pacing Inversion/eversion towel slides barefoot x25 cues for pacing  Seated heel/toe raises x25 cues for pacing and setup Long sitting ankle circles x12 B LE simultaneously cues for control and ROM  Neuromuscular re-ed: Desensitization techniques; long sitting LLE Pillowcase; beginning proximally at knee moving distally, longitudinally medial, central, and lateral down to toes. 5 laps Glove; method as above, 5 laps  Therapeutic Activity: Discussion/education regarding activity modification, pacing of activities, monitoring of symptoms, relevant anatomy/physiology and PNE as it pertains to daily function   [facility] Adult PT Treatment:                                                DATE: 10/07/22 Therapeutic Exercise: Towel inversion/ eversion x1' in shoes, out of shoes x1' Towel scrunch x 5 Toe yoga 2 x 10 (difficult, able to do easier with Rt too) Rocker board PF/DF, inv/ev x1' each Seated heel/toe raise 2x10 Self Care: Minimization of body impairments - "bad leg" to neutral term "left leg" Activity pacing, activity modification  PATIENT EDUCATION:  Education details: rationale for interventions, relevant anatomy/physiology, PNE, activity modification/pacing, HEP Person educated: Patient Education  method: Explanation, Demonstration, Tactile cues, Verbal cues, handout Education comprehension: verbalized understanding, returned demonstration, verbal cues required, tactile cues required, and needs further education     HOME EXERCISE PROGRAM:  Access Code: [email or web address] URL: [email or web address] Date: 10/14/2022 Prepared by: Enis Slipper  Exercises - Seated Heel Toe Raises  - 1 x daily - 7 x weekly - 2 sets - 10 reps - Towel Scrunches  - 1 x daily - 7 x weekly - 2 sets - 10 reps - Supine Ankle Circles  - 1 x daily - 7 x weekly - 2 sets - 10 reps  Patient Education - Rubbing with Different Textures  ASSESSMENT:   CLINICAL IMPRESSION: Pt arrives w/ 8/10 pain on NPS, endorses exacerbation with her recent family trip and increased life stressors/activity. Significant time spent w/ education/discussion as described above, focusing on symptom behavior, relevant anatomy/physiology/PNE, and modifying activities based on symptom behavior. Followed with therex in limited WB positions, denies any overt change in pain. Did regress program given pt in midst of exacerbation at present. Plan to trial aquatics at next visit to facilitate improved tolerance to activity. No adverse events, pt departs stating she feels a bit better than on arrival. Recommend continuing along current POC in order to address relevant deficits and improve functional tolerance. Pt departs today's session in no acute distress, all voiced questions/concerns addressed appropriately from PT perspective.      OBJECTIVE IMPAIRMENTS: Abnormal gait, decreased activity tolerance, decreased balance, decreased endurance, decreased mobility, difficulty walking, decreased ROM, decreased strength, hypomobility, increased edema, impaired perceived functional ability, impaired sensation, postural dysfunction, and pain.    ACTIVITY LIMITATIONS: carrying, lifting, bending,  sitting, standing, squatting, sleeping, stairs, transfers,  and locomotion level   PARTICIPATION LIMITATIONS: meal prep, cleaning, laundry, driving, shopping, community activity, and occupation   PERSONAL FACTORS: Time since onset of injury/illness/exacerbation and 1 comorbidity: anxiety/depression  are also affecting patient's functional outcome.    REHAB POTENTIAL: Fair given anatomy/physiology of condition and comorbidity   CLINICAL DECISION MAKING: Evolving/moderate complexity   EVALUATION COMPLEXITY: Moderate     GOALS: Goals reviewed with patient? No   SHORT TERM GOALS: Target date: 10/15/2022     Pt will demonstrate appropriate understanding and performance of initially prescribed HEP in order to facilitate improved independence with management of symptoms.  Baseline: HEP provided on eval 10/14/22: reports good compliance w/ HEP Goal status: MET   2. Pt will score greater than or equal to 44% on FOTO in order to demonstrate improved perception of function due to symptoms.            Baseline: 34%  10/05/22 41%            Goal status: ONGOING     LONG TERM GOALS: Target date: 11/12/2022    Pt will score 56% or greater on FOTO in order to demonstrate improved perception of function due to symptoms.  Baseline: 34% Goal status: INITIAL   2.  Pt will demonstrate at least 10 degrees of L ankle DF AROM in order to facilitate improved tolerance to functional movements such as transfers/gait.  Baseline: see ROM chart above Goal status: INITIAL   3.  Pt will be able to perform floor transfer w/ less than 2pt increase in resting pain in order to promote increased tolerance to work activities.  Baseline: unable to perform  Goal status: INITIAL   4.  Pt will be able to perform sit to stand transfer without UE support and less than 2pt increase in resting pain to improve safety w/ transfers.  Baseline: B UE support and altered mechanics Goal status: INITIAL      PLAN:   PT FREQUENCY: 2x/week   PT DURATION: 8 weeks   PLANNED  INTERVENTIONS: Therapeutic exercises, Therapeutic activity, Neuromuscular re-education, Balance training, Gait training, Patient/Family education, Self Care, Joint mobilization, Stair training, Aquatic Therapy, Electrical stimulation, Cryotherapy, Moist heat, Taping, Manual therapy, and Re-evaluation   PLAN FOR NEXT SESSION: review/update HEP. Continue desensitization as able/appropriate . Try to do more activity in WB as able/appropriate    Leeroy Cha PT, DPT 10/28/2022 12:43 PM

## 2022-10-28 ENCOUNTER — Encounter: Payer: Self-pay | Admitting: Physical Therapy

## 2022-10-28 ENCOUNTER — Ambulatory Visit: Payer: 59 | Attending: Physical Medicine & Rehabilitation | Admitting: Physical Therapy

## 2022-10-28 DIAGNOSIS — M25572 Pain in left ankle and joints of left foot: Secondary | ICD-10-CM | POA: Diagnosis not present

## 2022-10-28 DIAGNOSIS — R2689 Other abnormalities of gait and mobility: Secondary | ICD-10-CM | POA: Insufficient documentation

## 2022-10-28 DIAGNOSIS — R202 Paresthesia of skin: Secondary | ICD-10-CM | POA: Insufficient documentation

## 2022-10-29 NOTE — Therapy (Signed)
OUTPATIENT PHYSICAL THERAPY TREATMENT NOTE   Patient Name: Caitlin Bryant MRN: [id number] DOB:1981/06/11, 42 y.o., female Today's Date: 10/30/2022  PCP: Ronnell Freshwater, NP  REFERRING PROVIDER: Jennye Boroughs, MD   END OF SESSION:   PT End of Session - 10/30/22 1557     Visit Number 10    Number of Visits 13    Date for PT Re-Evaluation 11/12/22    Authorization Type Aet[employer]    Authorization - Visit Number 10    Authorization - Number of Visits 30               Past Medical History:  Diagnosis Date   Anxiety    Headache    MRSA (methicillin resistant staph aureus) culture positive    5 or more years ago, right thumb   Past Surgical History:  Procedure Laterality Date   ANKLE ARTHROSCOPY WITH RECONSTRUCTION Left 08/19/2021   Procedure: LEFT ANKLE ARTHROSCOPY, ARTHROSCOPIC TREATMENT OF TALUS OSTEOCHONDRAL LESION, LATERAL LIGAMENT RECONSTRUCTION, PERONEAL TENDON DEBRIDEMENT, REPAIR OF DISLOCATING PERONEAL TENDONS;  Surgeon: Erle Crocker, MD;  Location: [place];  Service: Orthopedics;  Laterality: Left;  LENGTH OF SURGERY: 120 MINUTES   CESAREAN SECTION  2004 and 2008   x 2   DIAGNOSTIC LAPAROSCOPY     TONSILLECTOMY     Patient Active Problem List   Diagnosis Date Noted   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 05/24/2022   Left peroneal nerve injury 05/24/2022   Cutaneous candidiasis 05/24/2022   BMI 34.0-34.9,adult 09/28/2021   Left foot pain 09/28/2021   Overweight with body mass index (BMI) 25.0-29.9 06/25/2021   Acute right-sided low back pain with right-sided sciatica 06/15/2021   Hip pain 06/15/2021   Disorder of lumbosacral intervertebral disc 06/15/2021   Chronic pain of left ankle 06/08/2021   Routine general medical examination at a health care facility 03/17/2021   Encounter to establish care 02/23/2021   Other fatigue 02/23/2021   Viral upper respiratory infection 12/04/2020   Acute bilateral low  back pain without sciatica 06/09/2019   Sore throat 06/09/2018   Left knee pain 04/20/2018   Impacted cerumen of right ear 04/15/2017   Acute non-recurrent pansinusitis 10/09/2016   Neck swelling 08/07/2016   Mouth sores 07/24/2015   Depression 07/24/2015   TMJ dysfunction 07/03/2015   Acute confusional migraine 05/10/2015   Ankle instability, left 02/22/2015   Generalized anxiety disorder 01/31/2015   Allergic rhinitis 01/31/2015    REFERRING DIAG: G40.102 (ICD-10-CM) - Complex regional pain syndrome type 1 of left lower extremity   THERAPY DIAG:  Pain in left ankle and joints of left foot  Other abnormalities of gait and mobility  Paresthesia of skin  Rationale for Evaluation and Treatment Rehabilitation  PERTINENT HISTORY: multiple MSK issues, migraines, anxiety   PRECAUTIONS: None  SUBJECTIVE:  SUBJECTIVE STATEMENT:   Patient reports that her pain was increased after last land session, but slight improvement today. She is very nervous about initiating aquatic therapy.    PAIN:  Are you having pain: yes 3-5/10 Location: L hip, knee, ankle, foot, and toes How would you describe your pain? Burning, shooting, tingling Best in past week: 3/10 (4/10 on eval) Worst in past week: 8/10 Aggravating factors: WB, movement, touch, difficulty sleeping due to positioning  Easing factors: positioning (especially dependent positioning)    OBJECTIVE: (objective measures completed at initial evaluation unless otherwise dated)   DIAGNOSTIC FINDINGS:  Pre surgical MRI showing damaged cartilage and loosened ligaments, pt reports multiple EMGs and other imaging since surgery. Unable to view these in epic. Ankle scope Dec 2022. Per referral pt with suspected CRPS.    PATIENT SURVEYS:  FOTO 34% current, 56%  predicted   10/05/2022 41%  COGNITION: Overall cognitive status: Within functional limits for tasks assessed                         SENSATION: Globally reduced sensation distal to knee - tingling to light touch dorsal L foot, hallux, and lateral foot With rough towel, increased tingling lateral foot and dorsal foot compared to light touch Increased burning medial calf and toes after sensation testing   EDEMA:  Mild edema throughout L foot dorsally and achilles tendon     PALPATION: TTP throughout ankle, foot, and lower limb globally    LOWER EXTREMITY ROM:    Active ROM Right eval Left eval  Knee flexion      Knee extension      Ankle dorsiflexion 10 2  Ankle plantarflexion   46  Ankle inversion 50 32  Ankle eversion 35 25   (Blank rows = not tested) (Key: WFL = within functional limits not formally assessed, * = concordant pain, s = stiffness/stretching sensation, NT = not tested)  Comments:     LOWER EXTREMITY MMT:   MMT Right eval Left eval  Knee flexion      Knee extension      Ankle dorsiflexion      Ankle plantarflexion      Ankle inversion      Ankle eversion       (Blank rows = not tested) Comments:     (Blank rows = not tested) (Key: WFL = within functional limits not formally assessed, * = concordant pain, s = stiffness/stretching sensation, NT = not tested)  Comments:       FUNCTIONAL TESTS:  Sit to stand: B UE support from standard surface, inc WB through RLE    GAIT: Distance walked: within clinic  Assistive device utilized: None Level of assistance: Complete Independence Comments: antalgic gait LLE, reduced truncal rotation B, partial step through pattern leading with RLE     TODAY'S TREATMENT:     [facility] Adult PT Treatment:                                                DATE: 10/30/22 Aquatic therapy at [address] - therapeutic pool temp approximately 92 degrees. Pt enters building ambulating independently. Treatment took  place in water 3.8 to  4 ft 8 in.feet deep depending upon activity.  Pt entered and exited the pool via stair and handrails independently.  Patient  entered water for aquatic therapy for first time and was introduced to  principles and therapeutic effects of water as they ambulated and acclimated to pool.  Therapeutic Exercise: Walking forward/backwards/side stepping - discussion of benefits of aquatic therapy and getting used to WB in aquatic environment At edge of pool: Heel raises x10 Sitting on bench: Ankle AROM all directions Ankle circles LAQ   [facility] Adult PT Treatment:                                                DATE: 10/28/22 Therapeutic Exercise: L3 BAPs circles x10 CW/CCW Rockerboard AP x20  Toe yoga 2x5 cues for form  Therapeutic Activity: Significant time spent w/ PNE, education/discussion re: symptom behavior over the past couple of weeks, coping strategies, activity modification in midst of exacerbation   [facility] Adult PT Treatment:                                                DATE: 10/14/22 Therapeutic Exercise: Toe yoga 5 repetitions cues for form, pacing Inversion/eversion towel slides barefoot x25 cues for pacing  Seated heel/toe raises x25 cues for pacing and setup Long sitting ankle circles x12 B LE simultaneously cues for control and ROM  Neuromuscular re-ed: Desensitization techniques; long sitting LLE Pillowcase; beginning proximally at knee moving distally, longitudinally medial, central, and lateral down to toes. 5 laps Glove; method as above, 5 laps  Therapeutic Activity: Discussion/education regarding activity modification, pacing of activities, monitoring of symptoms, relevant anatomy/physiology and PNE as it pertains to daily function    PATIENT EDUCATION:  Education details: rationale for interventions, relevant anatomy/physiology, PNE, activity modification/pacing, HEP Person educated: Patient Education method: Explanation, Demonstration,  Tactile cues, Verbal cues, handout Education comprehension: verbalized understanding, returned demonstration, verbal cues required, tactile cues required, and needs further education     HOME EXERCISE PROGRAM:  Access Code: [email or web address] URL: [email or web address] Date: 10/14/2022 Prepared by: Enis Slipper  Exercises - Seated Heel Toe Raises  - 1 x daily - 7 x weekly - 2 sets - 10 reps - Towel Scrunches  - 1 x daily - 7 x weekly - 2 sets - 10 reps - Supine Ankle Circles  - 1 x daily - 7 x weekly - 2 sets - 10 reps  Patient Education - Rubbing with Different Textures  ASSESSMENT:   CLINICAL IMPRESSION: Patient presents to first aquatic PT session reporting 3/10 pain and apprehension about beginning aquatic sessions. Beginning of session spent NWB on submerged bench with AROM of ankle and discussing therapeutic benefits of water. She did well with initial weight bearing activities, noting only one instance throughout of a sharp increase in pain in her Lt calf. She was able to complete heel raises in the aquatic environment with no increase in pain. Informed patient to monitor response to aquatic session over next few days to tailor next treatment session to how she responded. Patient continues to benefit from skilled PT services on land and aquatic based and should be progressed as able to improve functional independence.    OBJECTIVE IMPAIRMENTS: Abnormal gait, decreased activity tolerance, decreased balance, decreased endurance, decreased mobility, difficulty walking, decreased ROM, decreased strength, hypomobility, increased edema, impaired perceived functional ability, impaired sensation, postural dysfunction,  and pain.    ACTIVITY LIMITATIONS: carrying, lifting, bending, sitting, standing, squatting, sleeping, stairs, transfers, and locomotion level   PARTICIPATION LIMITATIONS: meal prep, cleaning, laundry, driving, shopping, community activity, and occupation   PERSONAL  FACTORS: Time since onset of injury/illness/exacerbation and 1 comorbidity: anxiety/depression  are also affecting patient's functional outcome.    REHAB POTENTIAL: Fair given anatomy/physiology of condition and comorbidity   CLINICAL DECISION MAKING: Evolving/moderate complexity   EVALUATION COMPLEXITY: Moderate     GOALS: Goals reviewed with patient? No   SHORT TERM GOALS: Target date: 10/15/2022     Pt will demonstrate appropriate understanding and performance of initially prescribed HEP in order to facilitate improved independence with management of symptoms.  Baseline: HEP provided on eval 10/14/22: reports good compliance w/ HEP Goal status: MET   2. Pt will score greater than or equal to 44% on FOTO in order to demonstrate improved perception of function due to symptoms.            Baseline: 34%  10/05/22 41%            Goal status: ONGOING     LONG TERM GOALS: Target date: 11/12/2022    Pt will score 56% or greater on FOTO in order to demonstrate improved perception of function due to symptoms.  Baseline: 34% Goal status: INITIAL   2.  Pt will demonstrate at least 10 degrees of L ankle DF AROM in order to facilitate improved tolerance to functional movements such as transfers/gait.  Baseline: see ROM chart above Goal status: INITIAL   3.  Pt will be able to perform floor transfer w/ less than 2pt increase in resting pain in order to promote increased tolerance to work activities.  Baseline: unable to perform  Goal status: INITIAL   4.  Pt will be able to perform sit to stand transfer without UE support and less than 2pt increase in resting pain to improve safety w/ transfers.  Baseline: B UE support and altered mechanics Goal status: INITIAL      PLAN:   PT FREQUENCY: 2x/week   PT DURATION: 8 weeks   PLANNED INTERVENTIONS: Therapeutic exercises, Therapeutic activity, Neuromuscular re-education, Balance training, Gait training, Patient/Family education, Self  Care, Joint mobilization, Stair training, Aquatic Therapy, Electrical stimulation, Cryotherapy, Moist heat, Taping, Manual therapy, and Re-evaluation   PLAN FOR NEXT SESSION: review/update HEP. Continue desensitization as able/appropriate . Try to do more activity in WB as able/appropriate    Margarette Canada PTA 10/30/2022 4:02 PM

## 2022-10-30 ENCOUNTER — Ambulatory Visit: Payer: 59

## 2022-10-30 DIAGNOSIS — R2689 Other abnormalities of gait and mobility: Secondary | ICD-10-CM | POA: Diagnosis not present

## 2022-10-30 DIAGNOSIS — R202 Paresthesia of skin: Secondary | ICD-10-CM

## 2022-10-30 DIAGNOSIS — M25572 Pain in left ankle and joints of left foot: Secondary | ICD-10-CM

## 2022-11-04 ENCOUNTER — Ambulatory Visit: Payer: 59 | Admitting: Physical Therapy

## 2022-11-06 ENCOUNTER — Ambulatory Visit: Payer: 59

## 2022-11-17 NOTE — Therapy (Signed)
OUTPATIENT PHYSICAL THERAPY TREATMENT NOTE + RECERTIFICATION   Patient Name: Caitlin Bryant MRN: [id number] DOB:[birth year], 42 y.o., female Today's Date: 11/18/2022  PCP: Ronnell Freshwater, NP  REFERRING PROVIDER: Jennye Boroughs, MD   END OF SESSION:   PT End of Session - 11/18/22 1330     Visit Number 11    Number of Visits 17    Date for PT Re-Evaluation 01/13/23    Authorization Type [employer]    PT Start Time 1330    PT Stop Time 1412    PT Time Calculation (min) 42 min    Activity Tolerance Patient limited by pain;Patient tolerated treatment well    Behavior During Therapy [facility] for tasks assessed/performed                Past Medical History:  Diagnosis Date   Anxiety    Headache    MRSA (methicillin resistant staph aureus) culture positive    5 or more years ago, right thumb   Past Surgical History:  Procedure Laterality Date   ANKLE ARTHROSCOPY WITH RECONSTRUCTION Left 08/19/2021   Procedure: LEFT ANKLE ARTHROSCOPY, ARTHROSCOPIC TREATMENT OF TALUS OSTEOCHONDRAL LESION, LATERAL LIGAMENT RECONSTRUCTION, PERONEAL TENDON DEBRIDEMENT, REPAIR OF DISLOCATING PERONEAL TENDONS;  Surgeon: Erle Crocker, MD;  Location: [place];  Service: Orthopedics;  Laterality: Left;  LENGTH OF SURGERY: 120 MINUTES   CESAREAN SECTION  2004 and 2008   x 2   DIAGNOSTIC LAPAROSCOPY     TONSILLECTOMY     Patient Active Problem List   Diagnosis Date Noted   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 05/24/2022   Left peroneal nerve injury 05/24/2022   Cutaneous candidiasis 05/24/2022   BMI 34.0-34.9,adult 09/28/2021   Left foot pain 09/28/2021   Overweight with body mass index (BMI) 25.0-29.9 06/25/2021   Acute right-sided low back pain with right-sided sciatica 06/15/2021   Hip pain 06/15/2021   Disorder of lumbosacral intervertebral disc 06/15/2021   Chronic pain of left ankle 06/08/2021   Routine general medical examination at a health  care facility 03/17/2021   Encounter to establish care 02/23/2021   Other fatigue 02/23/2021   Viral upper respiratory infection 12/04/2020   Acute bilateral low back pain without sciatica 06/09/2019   Sore throat 06/09/2018   Left knee pain 04/20/2018   Impacted cerumen of right ear 04/15/2017   Acute non-recurrent pansinusitis 10/09/2016   Neck swelling 08/07/2016   Mouth sores 07/24/2015   Depression 07/24/2015   TMJ dysfunction 07/03/2015   Acute confusional migraine 05/10/2015   Ankle instability, left 02/22/2015   Generalized anxiety disorder 01/31/2015   Allergic rhinitis 01/31/2015    REFERRING DIAG: [id number] (ICD-10-CM) - Complex regional pain syndrome type 1 of left lower extremity   THERAPY DIAG:  Pain in left ankle and joints of left foot  Other abnormalities of gait and mobility  Paresthesia of skin  Rationale for Evaluation and Treatment Rehabilitation  PERTINENT HISTORY: multiple MSK issues, migraines, anxiety   PRECAUTIONS: None  SUBJECTIVE:  SUBJECTIVE STATEMENT:   Pt states things have been very fatiguing with having to help her husband with his surgical recovery. States she enjoyed the pool but has only been able to do one visit. Pt states she feels a little better with therapy, feels like she is able to push through pain a bit more but still has about the same amount/intensity. Sees pain doctor on April 2nd.   PAIN:  Are you having pain: a little stiff and painful, 3/10 pain  Location: L hip, knee, ankle, foot, and toes How would you describe your pain? Burning, shooting, tingling Best in past week: 3/10 (4/10 on eval) Worst in past week: 8/10 Aggravating factors: WB, movement, touch, difficulty sleeping due to positioning  Easing factors: positioning (especially dependent  positioning)    OBJECTIVE: (objective measures completed at initial evaluation unless otherwise dated)   DIAGNOSTIC FINDINGS:  Pre surgical MRI showing damaged cartilage and loosened ligaments, pt reports multiple EMGs and other imaging since surgery. Unable to view these in epic. Ankle scope Dec 2022. Per referral pt with suspected CRPS.    PATIENT SURVEYS:  FOTO 34% current, 56% predicted   10/05/2022 41% 11/18/22: 40%   COGNITION: Overall cognitive status: Within functional limits for tasks assessed                         SENSATION: Globally reduced sensation distal to knee - tingling to light touch dorsal L foot, hallux, and lateral foot With rough towel, increased tingling lateral foot and dorsal foot compared to light touch Increased burning medial calf and toes after sensation testing   EDEMA:  Mild edema throughout L foot dorsally and achilles tendon     PALPATION: TTP throughout ankle, foot, and lower limb globally    LOWER EXTREMITY ROM:    Active ROM Right eval Left eval Left 11/17/22   Knee flexion       Knee extension       Ankle dorsiflexion 10 2 2  deg   Ankle plantarflexion   46 45 deg  Ankle inversion 50 32 30 deg  Ankle eversion 35 25 28 deg   (Blank rows = not tested) (Key: WFL = within functional limits not formally assessed, * = concordant pain, s = stiffness/stretching sensation, NT = not tested)  Comments:     LOWER EXTREMITY MMT:   MMT Right eval Left eval  Knee flexion      Knee extension      Ankle dorsiflexion      Ankle plantarflexion      Ankle inversion      Ankle eversion       (Blank rows = not tested) Comments:     (Blank rows = not tested) (Key: WFL = within functional limits not formally assessed, * = concordant pain, s = stiffness/stretching sensation, NT = not tested)  Comments:       FUNCTIONAL TESTS:  Sit to stand: B UE support from standard surface, inc WB through RLE   11/18/22:  STS from mat - no UE support, mild  hesitancy initially but no increase in pain reported, grossly symmetrical WB Floor transfer - tall kneel>half kneel>LLE lunge with UE support. Education + cues for improved mechanics, SBA. Performs safely w/ increased L thigh pain   GAIT: Distance walked: within clinic  Assistive device utilized: None Level of assistance: Complete Independence Comments: antalgic gait LLE, reduced truncal rotation B, partial step through pattern leading with RLE  TODAY'S TREATMENT:     [place] Adult PT Treatment:                                                DATE: 11/18/22  Therapeutic Activity: Floor transfer (see above) STS transfer no UE support FOTO + education MSK assessment + education Significant time spent w/ pt education re: relevant anatomy/physiology, gradual progression of activity, activity modification/pacing, symptom response to activity, PT progress/POC   [facility] Adult PT Treatment:                                                DATE: 10/30/22 Aquatic therapy at [address] - therapeutic pool temp approximately 92 degrees. Pt enters building ambulating independently. Treatment took place in water 3.8 to  4 ft 8 in.feet deep depending upon activity.  Pt entered and exited the pool via stair and handrails independently.  Patient entered water for aquatic therapy for first time and was introduced to  principles and therapeutic effects of water as they ambulated and acclimated to pool.  Therapeutic Exercise: Walking forward/backwards/side stepping - discussion of benefits of aquatic therapy and getting used to WB in aquatic environment At edge of pool: Heel raises x10 Sitting on bench: Ankle AROM all directions Ankle circles LAQ   [facility] Adult PT Treatment:                                                DATE: 10/28/22 Therapeutic Exercise: L3 BAPs circles x10 CW/CCW Rockerboard AP x20  Toe yoga 2x5 cues for form  Therapeutic Activity: Significant time spent w/ PNE,  education/discussion re: symptom behavior over the past couple of weeks, coping strategies, activity modification in midst of exacerbation    PATIENT EDUCATION:  Education details: rationale for interventions, relevant anatomy/physiology,activity modification/pacing, PT progress/POC Person educated: Patient Education method: Explanation, Demonstration, Tactile cues, Verbal cues, handout Education comprehension: verbalized understanding, returned demonstration, verbal cues required, tactile cues required, and needs further education     HOME EXERCISE PROGRAM:  Access Code: [email or web address] URL: [email or web address] Date: 10/14/2022 Prepared by: Enis Slipper  Exercises - Seated Heel Toe Raises  - 1 x daily - 7 x weekly - 2 sets - 10 reps - Towel Scrunches  - 1 x daily - 7 x weekly - 2 sets - 10 reps - Supine Ankle Circles  - 1 x daily - 7 x weekly - 2 sets - 10 reps  Patient Education - Rubbing with Different Textures  ASSESSMENT:   CLINICAL IMPRESSION: Pt arrives w/ 3/10 pain, states she has had increased pain over past few weeks, increased life stressors. Reports improvement in "pushing through" pain since starting PT but overall about the same with frequency/intensity. Goals addressed below - FOTO and AROM comparable to initial eval but does demo improved functional mechanics, able to look at floor transfer today. Significant time spent w/ education/discussion as above - pt agreeable to extension of POC for now, will plan to integrate more aquatic therapy as able, would also like to introduce motor imagery and mirror feedback to assess response. No adverse  events, pt denies any increase in pain on departure compared to arrival. Pt departs today's session in no acute distress, all voiced questions/concerns addressed appropriately from PT perspective.     OBJECTIVE IMPAIRMENTS: Abnormal gait, decreased activity tolerance, decreased balance, decreased endurance, decreased  mobility, difficulty walking, decreased ROM, decreased strength, hypomobility, increased edema, impaired perceived functional ability, impaired sensation, postural dysfunction, and pain.    ACTIVITY LIMITATIONS: carrying, lifting, bending, sitting, standing, squatting, sleeping, stairs, transfers, and locomotion level   PARTICIPATION LIMITATIONS: meal prep, cleaning, laundry, driving, shopping, community activity, and occupation   PERSONAL FACTORS: Time since onset of injury/illness/exacerbation and 1 comorbidity: anxiety/depression  are also affecting patient's functional outcome.    REHAB POTENTIAL: Fair given anatomy/physiology of condition and comorbidity   CLINICAL DECISION MAKING: Evolving/moderate complexity   EVALUATION COMPLEXITY: Moderate     GOALS: Goals reviewed with patient? No   SHORT TERM GOALS: Target date: 10/15/2022     Pt will demonstrate appropriate understanding and performance of initially prescribed HEP in order to facilitate improved independence with management of symptoms.  Baseline: HEP provided on eval 10/14/22: reports good compliance w/ HEP Goal status: MET   2. Pt will score greater than or equal to 44% on FOTO in order to demonstrate improved perception of function due to symptoms.            Baseline: 34%  10/05/22 41%            Goal status: ONGOING     LONG TERM GOALS: Target date: 12/30/2022 (updated 11/18/22)     Pt will score 56% or greater on FOTO in order to demonstrate improved perception of function due to symptoms.  Baseline: 34% 11/18/22: 40%  Goal status: ONGOING   2.  Pt will demonstrate at least 10 degrees of L ankle DF AROM in order to facilitate improved tolerance to functional movements such as transfers/gait.  Baseline: see ROM chart above 11/18/22: see ROM chart above Goal status: ONGOING   3.  Pt will be able to perform floor transfer w/ less than 2pt increase in resting pain in order to promote increased tolerance to work  activities.  Baseline: unable to perform  11/18/22: able to perform w/ UE support and increase in L thigh pain  Goal status: ONGOING   4.  Pt will be able to perform sit to stand transfer without UE support and less than 2pt increase in resting pain to improve safety w/ transfers.  Baseline: B UE support and altered mechanics 11/18/22: no UE support, some hesitancy but no increase in pain   Goal status: ONGOING      PLAN (updated 11/18/22):   PT FREQUENCY: 1x/week   PT DURATION: 6 weeks   PLANNED INTERVENTIONS: Therapeutic exercises, Therapeutic activity, Neuromuscular re-education, Balance training, Gait training, Patient/Family education, Self Care, Joint mobilization, Stair training, Aquatic Therapy, Electrical stimulation, Cryotherapy, Moist heat, Taping, Manual therapy, and Re-evaluation   PLAN FOR NEXT SESSION: motor imagery, mirror feedback. Aquatic as able  Leeroy Cha PT, DPT 11/18/2022 5:31 PM

## 2022-11-18 ENCOUNTER — Ambulatory Visit: Payer: 59 | Admitting: Physical Therapy

## 2022-11-18 ENCOUNTER — Encounter: Payer: Self-pay | Admitting: Physical Therapy

## 2022-11-18 DIAGNOSIS — R202 Paresthesia of skin: Secondary | ICD-10-CM | POA: Diagnosis not present

## 2022-11-18 DIAGNOSIS — M25572 Pain in left ankle and joints of left foot: Secondary | ICD-10-CM | POA: Diagnosis not present

## 2022-11-18 DIAGNOSIS — R2689 Other abnormalities of gait and mobility: Secondary | ICD-10-CM

## 2022-11-24 ENCOUNTER — Encounter: Payer: 59 | Attending: Physical Medicine & Rehabilitation | Admitting: Physical Medicine & Rehabilitation

## 2022-11-24 ENCOUNTER — Encounter: Payer: Self-pay | Admitting: Physical Medicine & Rehabilitation

## 2022-11-24 VITALS — BP 120/81 | HR 86 | Ht 63.0 in | Wt 200.2 lb

## 2022-11-24 DIAGNOSIS — G894 Chronic pain syndrome: Secondary | ICD-10-CM | POA: Insufficient documentation

## 2022-11-24 DIAGNOSIS — Z5181 Encounter for therapeutic drug level monitoring: Secondary | ICD-10-CM | POA: Diagnosis not present

## 2022-11-24 DIAGNOSIS — Z79891 Long term (current) use of opiate analgesic: Secondary | ICD-10-CM

## 2022-11-24 DIAGNOSIS — M7989 Other specified soft tissue disorders: Secondary | ICD-10-CM

## 2022-11-24 DIAGNOSIS — G90522 Complex regional pain syndrome I of left lower limb: Secondary | ICD-10-CM | POA: Diagnosis not present

## 2022-11-24 MED ORDER — TRAMADOL HCL 50 MG PO TABS: 50.0000 mg | ORAL_TABLET | Freq: Two times a day (BID) | ORAL | 0 refills | Status: DC | PRN

## 2022-11-24 NOTE — Progress Notes (Signed)
Subjective:    Patient ID: Caitlin Bryant, female    DOB: October 28, 1980, 42 y.o.   MRN: [id number]  HPI  Caitlin Bryant is a 42 y.o. year old female  who  has a past medical history of Anxiety, Headache, and MRSA (methicillin resistant staph aureus) culture positive.   They are presenting to PM&R clinic as a new patient for pain management evaluation. They were referred for treatment of chronic pain. She reports her pain started after her ankle tendon surgery Aug 19, 2021.  She reports she had the surgery due to frequent ankle sprains.  She had a nerve block at the time behind her knee.  She completed physical therapy after her surgery.  Since this time has has had severe pain in her left lower extremity.  Patient reports her pain began August 19, 2021 when she had left ankle tendon surgery.  Since this time.  She has had worsening pain in her left lower extremity.  Her leg is very painful from her lower left thigh down to her foot.  She previously worked as a Pharmacist, hospital.  She has been much less active and has had weight gain.  She is only able to walk very short distances.  Pain is progressively getting worse.  She has numbness and tingling in her distal toes.  Light touch is a very painful around her foot and ankle.  The sensation of showering is very painful.  Her pain is worse at night.  She has had a lot of swelling in her lower extremities since this time from her knee down to her foot.  She has been followed by neurology.  She was seen by Dr. Marlou Sa who was suspecting CRPS.  She initially had an EMG that was reported to have altered sensory function however EMG was repeated by Dr. Berdine Addison neurology and no significant abnormalities were noted.   Red flag symptoms: Patient denies saddle anesthesia, loss of bowel or bladder continence, new weakness, new numbness/tingling, or pain waking up at nighttime.   Medications tried: Gabapentin -unsure dose, only able to take HS due to sedation, liminal  benefit for pain Ibuprofen 200-400mg  2-3 times a day     Interval History 10/01/22 She reports continued pain in her left lower extremity that continues to worsen.  She has not noted significant benefit with Lyrica however she has been able to tolerate this better than gabapentin thus far.  She has started physical therapy.  She reports that desensitization techniques are tolerable initially however hours later she will have severe pain in this area.  Her pain continues to cover her entire left leg.  She sometimes feels a burning sensation in her toes.  She has a lot of paresthesias in her leg as well.  She will also be trying in aquatic therapy in addition to a land-based therapy.  Occasionally her leg will become red.  Her left leg has increased swelling compared to her right leg.  Interval History 11/24/22 Patient is here for follow-up of her left lower extremity pain.  She continues to work with physical therapy.  She also has some difficulties recently due to illness with her husband as well.  She feels like her pain is largely unchanged.  Possibly with some increased ability to push to the pain per therapy notes.  She is scheduled for some more pool therapy.  She is only taking Lyrica at night and this continues to cause her sedation.  While she does not feel like  the leg is insensate to touch, she feels that she cannot sense deep pressure as much on her left leg as she can on her right leg.  She is frustrated with her limitations.   Pain Inventory Average Pain 5 Pain Right Now 8 My pain is sharp, burning, dull, stabbing, tingling, aching, and tearing  In the last 24 hours, has pain interfered with the following? General activity 9 Relation with others 9 Enjoyment of life 9 What TIME of day is your pain at its worst? morning , daytime, evening, and night Sleep (in general) Poor  Pain is worse with: walking, bending, sitting, inactivity, standing, and some activites Pain improves with:   . Relief from Meds: 0  Family History  Problem Relation Age of Onset   Hypertension Mother    Diabetes Mother    Osteoarthritis Mother    Fibromyalgia Mother    Heart attack Father        Defibrillator   Social History   Socioeconomic History   Marital status: Married    Spouse name: Not on file   Number of children: Not on file   Years of education: Not on file   Highest education level: Not on file  Occupational History   Not on file  Tobacco Use   Smoking status: Never   Smokeless tobacco: Never  Vaping Use   Vaping Use: Never used  Substance and Sexual Activity   Alcohol use: Yes    Comment: 1 a month or less   Drug use: No   Sexual activity: Yes    Partners: Male  Other Topics Concern   Not on file  Social History Narrative   Are you right handed or left handed? Right   Are you currently employed ? no      Do you live at home alone?husband and 2 daughters   Caffeine none   What type of home do you live in: 1 story or 2 story? One with stairs inside       Social Determinants of Health   Financial Resource Strain: Not on file  Food Insecurity: Not on file  Transportation Needs: Not on file  Physical Activity: Not on file  Stress: Not on file  Social Connections: Not on file   Past Surgical History:  Procedure Laterality Date   ANKLE ARTHROSCOPY WITH RECONSTRUCTION Left 08/19/2021   Procedure: LEFT ANKLE ARTHROSCOPY, ARTHROSCOPIC TREATMENT OF TALUS OSTEOCHONDRAL LESION, LATERAL LIGAMENT RECONSTRUCTION, PERONEAL TENDON DEBRIDEMENT, REPAIR OF DISLOCATING PERONEAL TENDONS;  Surgeon: Erle Crocker, MD;  Location: [place];  Service: Orthopedics;  Laterality: Left;  LENGTH OF SURGERY: 120 MINUTES   CESAREAN SECTION  2004 and 2008   x 2   DIAGNOSTIC LAPAROSCOPY     TONSILLECTOMY     Past Surgical History:  Procedure Laterality Date   ANKLE ARTHROSCOPY WITH RECONSTRUCTION Left 08/19/2021   Procedure: LEFT ANKLE ARTHROSCOPY,  ARTHROSCOPIC TREATMENT OF TALUS OSTEOCHONDRAL LESION, LATERAL LIGAMENT RECONSTRUCTION, PERONEAL TENDON DEBRIDEMENT, REPAIR OF DISLOCATING PERONEAL TENDONS;  Surgeon: Erle Crocker, MD;  Location: [place];  Service: Orthopedics;  Laterality: Left;  LENGTH OF SURGERY: 120 MINUTES   CESAREAN SECTION  2004 and 2008   x 2   DIAGNOSTIC LAPAROSCOPY     TONSILLECTOMY     Past Medical History:  Diagnosis Date   Anxiety    Headache    MRSA (methicillin resistant staph aureus) culture positive    5 or more years ago, right thumb  BP 120/81   Pulse 86   Ht 5\' 3"  (1.6 m)   Wt 200 lb 3.2 oz (90.8 kg)   SpO2 97%   BMI 35.46 kg/m   Opioid Risk Score:   Fall Risk Score:  `1  Depression screen [facility] 2/9     10/01/2022    3:52 PM 09/17/2022    9:17 AM 08/31/2022    3:09 PM 06/17/2022   10:17 AM 05/06/2022   10:37 AM 03/24/2022   10:17 AM 07/07/2021   11:04 AM  Depression screen PH[facility] 2/9  Decreased Interest 0 1 3 1 1 1 1   Down, Depressed, Hopeless 0 1 2 0 1 0 0  PHQ - 2 Score 0 2 5 1 2 1 1   Altered sleeping  2 3 0 0 1 1  Tired, decreased energy  1 2 1 1 1 1   Change in appetite  2 2 1 1 1 1   Feeling bad or failure about yourself   0 0 0 1 0 0  Trouble concentrating  1 0 1 1 1 1   Moving slowly or fidgety/restless  0 0 0 1 0 0  Suicidal thoughts  0 0 0 0 0 0  PHQ-9 Score  8 12 4 7 5 5   Difficult doing work/chores   Very difficult   Not difficult at all      Review of Systems  Musculoskeletal:        Left leg pain  All other systems reviewed and are negative.     Objective:   Physical Exam   Gen: no distress, normal appearing HEENT: oral mucosa pink and moist, NCAT Chest: normal effort, normal rate of breathing Abd: soft, non-distended Psych: pleasant, normal affect Skin: intact Neuro: CN 2-12 grossly intact, follows commands,  Strength RLE 5/5 throughout Strength LLE at least 4/5 limited by pain Sensation intact light touch in all 4 extremities however she  reports his altered throughout L leg Antalgic gait Musculoskeletal: Allodynia LLE starting lower thigh and below  Pain with movement of L ankle in all directions, pain with movement L knee  Slight swelling noted in LUE compared to RUE Healed surgical incision on her left ankle No hair/toenail changes noted     Assessment & Plan:   LLE pain after L ankle tendon surgery 2022 -Normal EMG LLE 08/10/22 -Agree with neurology and orthopedics, suspect CRPS type 1 -Continue Ibuprofen  -Lyrica - only able to tolerate 75mg  hs, reports no benefit. Will discontinue -Continue PT, aquatherapy  -Completed form for handicap parking last visit  -Consult placed for trial sympathetic nerve block, she would like visit to discuss this further with provider completing procedure -ORT mod -Tramadol 50mg  ordered q12h prn for 1 week -UDS today and Pain agreement- consider continuation of tramadol based on results UDS -Will check Vascular U/S LLE r/o DVT

## 2022-11-26 ENCOUNTER — Ambulatory Visit: Payer: 59 | Attending: Physical Medicine & Rehabilitation

## 2022-11-26 DIAGNOSIS — M25572 Pain in left ankle and joints of left foot: Secondary | ICD-10-CM | POA: Insufficient documentation

## 2022-11-26 DIAGNOSIS — R202 Paresthesia of skin: Secondary | ICD-10-CM | POA: Diagnosis not present

## 2022-11-26 DIAGNOSIS — R2689 Other abnormalities of gait and mobility: Secondary | ICD-10-CM | POA: Diagnosis not present

## 2022-11-26 NOTE — Therapy (Signed)
OUTPATIENT PHYSICAL THERAPY TREATMENT NOTE   Patient Name: Caitlin Bryant MRN: [id number] DOB:05/05/1981, 42 y.o., female Today's Date: 11/26/2022  PCP: Ronnell Freshwater, NP  REFERRING PROVIDER: Jennye Boroughs, MD   END OF SESSION:   PT End of Session - 11/26/22 1657     Visit Number 12    Number of Visits 17    Date for PT Re-Evaluation 01/13/23    Authorization Type [employer]    Authorization - Visit Number 11    Authorization - Number of Visits 30    PT Start Time 1700    PT Stop Time 1740    PT Time Calculation (min) 40 min    Activity Tolerance Patient limited by pain;Patient tolerated treatment well    Behavior During Therapy [facility] for tasks assessed/performed             Past Medical History:  Diagnosis Date   Anxiety    Headache    MRSA (methicillin resistant staph aureus) culture positive    5 or more years ago, right thumb   Past Surgical History:  Procedure Laterality Date   ANKLE ARTHROSCOPY WITH RECONSTRUCTION Left 08/19/2021   Procedure: LEFT ANKLE ARTHROSCOPY, ARTHROSCOPIC TREATMENT OF TALUS OSTEOCHONDRAL LESION, LATERAL LIGAMENT RECONSTRUCTION, PERONEAL TENDON DEBRIDEMENT, REPAIR OF DISLOCATING PERONEAL TENDONS;  Surgeon: Erle Crocker, MD;  Location: [facility];  Service: Orthopedics;  Laterality: Left;  LENGTH OF SURGERY: 120 MINUTES   CESAREAN SECTION  2004 and 2008   x 2   DIAGNOSTIC LAPAROSCOPY     TONSILLECTOMY     Patient Active Problem List   Diagnosis Date Noted   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 05/24/2022   Left peroneal nerve injury 05/24/2022   Cutaneous candidiasis 05/24/2022   BMI 34.0-34.9,adult 09/28/2021   Left foot pain 09/28/2021   Overweight with body mass index (BMI) 25.0-29.9 06/25/2021   Acute right-sided low back pain with right-sided sciatica 06/15/2021   Hip pain 06/15/2021   Disorder of lumbosacral intervertebral disc 06/15/2021   Chronic pain of left ankle 06/08/2021    Routine general medical examination at a health care facility 03/17/2021   Encounter to establish care 02/23/2021   Other fatigue 02/23/2021   Viral upper respiratory infection 12/04/2020   Acute bilateral low back pain without sciatica 06/09/2019   Sore throat 06/09/2018   Left knee pain 04/20/2018   Impacted cerumen of right ear 04/15/2017   Acute non-recurrent pansinusitis 10/09/2016   Neck swelling 08/07/2016   Mouth sores 07/24/2015   Depression 07/24/2015   TMJ dysfunction 07/03/2015   Acute confusional migraine 05/10/2015   Ankle instability, left 02/22/2015   Generalized anxiety disorder 01/31/2015   Allergic rhinitis 01/31/2015    REFERRING DIAG: [id number] (ICD-10-CM) - Complex regional pain syndrome type 1 of left lower extremity   THERAPY DIAG:  Pain in left ankle and joints of left foot  Other abnormalities of gait and mobility  Paresthesia of skin  Rationale for Evaluation and Treatment Rehabilitation  PERTINENT HISTORY: multiple MSK issues, migraines, anxiety   PRECAUTIONS: None  SUBJECTIVE:  SUBJECTIVE STATEMENT:  Patient reports that she saw pain management on Tuesday and they changed up some of her medication, including adding tramadol.    PAIN:  Are you having pain: a little stiff and painful, 6/10 pain  Location: L hip, knee, ankle, foot, and toes How would you describe your pain? Burning, shooting, tingling Best in past week: 3/10 (4/10 on eval) Worst in past week: 8/10 Aggravating factors: WB, movement, touch, difficulty sleeping due to positioning  Easing factors: positioning (especially dependent positioning)    OBJECTIVE: (objective measures completed at initial evaluation unless otherwise dated)   DIAGNOSTIC FINDINGS:  Pre surgical MRI showing damaged cartilage  and loosened ligaments, pt reports multiple EMGs and other imaging since surgery. Unable to view these in epic. Ankle scope Dec 2022. Per referral pt with suspected CRPS.    PATIENT SURVEYS:  FOTO 34% current, 56% predicted   10/05/2022 41% 11/18/22: 40%   COGNITION: Overall cognitive status: Within functional limits for tasks assessed                         SENSATION: Globally reduced sensation distal to knee - tingling to light touch dorsal L foot, hallux, and lateral foot With rough towel, increased tingling lateral foot and dorsal foot compared to light touch Increased burning medial calf and toes after sensation testing   EDEMA:  Mild edema throughout L foot dorsally and achilles tendon     PALPATION: TTP throughout ankle, foot, and lower limb globally    LOWER EXTREMITY ROM:    Active ROM Right eval Left eval Left 11/17/22   Knee flexion       Knee extension       Ankle dorsiflexion 10 2 2  deg   Ankle plantarflexion   46 45 deg  Ankle inversion 50 32 30 deg  Ankle eversion 35 25 28 deg   (Blank rows = not tested) (Key: WFL = within functional limits not formally assessed, * = concordant pain, s = stiffness/stretching sensation, NT = not tested)  Comments:     LOWER EXTREMITY MMT:   MMT Right eval Left eval  Knee flexion      Knee extension      Ankle dorsiflexion      Ankle plantarflexion      Ankle inversion      Ankle eversion       (Blank rows = not tested) Comments:     (Blank rows = not tested) (Key: WFL = within functional limits not formally assessed, * = concordant pain, s = stiffness/stretching sensation, NT = not tested)  Comments:       FUNCTIONAL TESTS:  Sit to stand: B UE support from standard surface, inc WB through RLE   11/18/22:  STS from mat - no UE support, mild hesitancy initially but no increase in pain reported, grossly symmetrical WB Floor transfer - tall kneel>half kneel>LLE lunge with UE support. Education + cues for improved  mechanics, SBA. Performs safely w/ increased L thigh pain   GAIT: Distance walked: within clinic  Assistive device utilized: None Level of assistance: Complete Independence Comments: antalgic gait LLE, reduced truncal rotation B, partial step through pattern leading with RLE     TODAY'S TREATMENT:   [facility] Adult PT Treatment:  DATE: 11/26/22 Neuromuscular re-ed: Graded Motor Imagery (GMI) Laterality recognition (left/right discrimination) with Recognise Foot app - activation of pre-motor cortex to establish cortical representation of foot - aiming for 80% accuracy and 1.5-2.5 seconds - patient able to achieve 90% accurate on LLE, 1.74 seconds today Mirror therapy: closed eyes and imaging limbs and movements, then just staring at limb, then moving to ankle pumps,inv/ev - making sure to keep LLE out of view (patient reports nausea with inv/ev, feels confusion over movement and feeling pain in LLE) Self Care: Continued PNE, activity modification, symptom management      [facility] Adult PT Treatment:                                                DATE: 11/18/22  Therapeutic Activity: Floor transfer (see above) STS transfer no UE support FOTO + education MSK assessment + education Significant time spent w/ pt education re: relevant anatomy/physiology, gradual progression of activity, activity modification/pacing, symptom response to activity, PT progress/POC   [facility] Adult PT Treatment:                                                DATE: 10/30/22 Aquatic therapy at [address] - therapeutic pool temp approximately 92 degrees. Pt enters building ambulating independently. Treatment took place in water 3.8 to  4 ft 8 in.feet deep depending upon activity.  Pt entered and exited the pool via stair and handrails independently.  Patient entered water for aquatic therapy for first time and was introduced to  principles and therapeutic effects  of water as they ambulated and acclimated to pool.  Therapeutic Exercise: Walking forward/backwards/side stepping - discussion of benefits of aquatic therapy and getting used to WB in aquatic environment At edge of pool: Heel raises x10 Sitting on bench: Ankle AROM all directions Ankle circles LAQ     PATIENT EDUCATION:  Education details: rationale for interventions, relevant anatomy/physiology,activity modification/pacing, PT progress/POC Person educated: Patient Education method: Explanation, Demonstration, Tactile cues, Verbal cues, handout Education comprehension: verbalized understanding, returned demonstration, verbal cues required, tactile cues required, and needs further education     HOME EXERCISE PROGRAM:  Access Code: [email or web address] URL: [email or web address] Date: 10/14/2022 Prepared by: Enis Slipper  Exercises - Seated Heel Toe Raises  - 1 x daily - 7 x weekly - 2 sets - 10 reps - Towel Scrunches  - 1 x daily - 7 x weekly - 2 sets - 10 reps - Supine Ankle Circles  - 1 x daily - 7 x weekly - 2 sets - 10 reps  Patient Education - Rubbing with Different Textures  ASSESSMENT:   CLINICAL IMPRESSION: Patient presents to PT with 7/10 pain, reports same pain with added tingles at end of session. She states that her pain has been especially bad since end of last week and that she saw her pain management MD who has changed her medication, which she has not started yet. Initiated graded motor imagery today with laterality discrimination and mirror therapy. Laterality discrimination showed decreased recognition of LLE. She displayed increased nerves with mirror therapy and reports nausea with inversion/eversion, terminated after this. Patient continues to benefit from skilled PT services and should be progressed as able to improve  functional independence.    OBJECTIVE IMPAIRMENTS: Abnormal gait, decreased activity tolerance, decreased balance, decreased endurance,  decreased mobility, difficulty walking, decreased ROM, decreased strength, hypomobility, increased edema, impaired perceived functional ability, impaired sensation, postural dysfunction, and pain.    ACTIVITY LIMITATIONS: carrying, lifting, bending, sitting, standing, squatting, sleeping, stairs, transfers, and locomotion level   PARTICIPATION LIMITATIONS: meal prep, cleaning, laundry, driving, shopping, community activity, and occupation   PERSONAL FACTORS: Time since onset of injury/illness/exacerbation and 1 comorbidity: anxiety/depression  are also affecting patient's functional outcome.    REHAB POTENTIAL: Fair given anatomy/physiology of condition and comorbidity   CLINICAL DECISION MAKING: Evolving/moderate complexity   EVALUATION COMPLEXITY: Moderate     GOALS: Goals reviewed with patient? No   SHORT TERM GOALS: Target date: 10/15/2022     Pt will demonstrate appropriate understanding and performance of initially prescribed HEP in order to facilitate improved independence with management of symptoms.  Baseline: HEP provided on eval 10/14/22: reports good compliance w/ HEP Goal status: MET   2. Pt will score greater than or equal to 44% on FOTO in order to demonstrate improved perception of function due to symptoms.            Baseline: 34%  10/05/22 41%            Goal status: ONGOING     LONG TERM GOALS: Target date: 12/30/2022 (updated 11/18/22)     Pt will score 56% or greater on FOTO in order to demonstrate improved perception of function due to symptoms.  Baseline: 34% 11/18/22: 40%  Goal status: ONGOING   2.  Pt will demonstrate at least 10 degrees of L ankle DF AROM in order to facilitate improved tolerance to functional movements such as transfers/gait.  Baseline: see ROM chart above 11/18/22: see ROM chart above Goal status: ONGOING   3.  Pt will be able to perform floor transfer w/ less than 2pt increase in resting pain in order to promote increased tolerance to  work activities.  Baseline: unable to perform  11/18/22: able to perform w/ UE support and increase in L thigh pain  Goal status: ONGOING   4.  Pt will be able to perform sit to stand transfer without UE support and less than 2pt increase in resting pain to improve safety w/ transfers.  Baseline: B UE support and altered mechanics 11/18/22: no UE support, some hesitancy but no increase in pain   Goal status: ONGOING      PLAN (updated 11/18/22):   PT FREQUENCY: 1x/week   PT DURATION: 6 weeks   PLANNED INTERVENTIONS: Therapeutic exercises, Therapeutic activity, Neuromuscular re-education, Balance training, Gait training, Patient/Family education, Self Care, Joint mobilization, Stair training, Aquatic Therapy, Electrical stimulation, Cryotherapy, Moist heat, Taping, Manual therapy, and Re-evaluation   PLAN FOR NEXT SESSION: motor imagery, mirror feedback. Aquatic as able  Margarette Canada PTA 11/26/2022 5:39 PM

## 2022-11-27 LAB — TOXASSURE SELECT,+ANTIDEPR,UR

## 2022-12-07 NOTE — Therapy (Signed)
OUTPATIENT PHYSICAL THERAPY TREATMENT NOTE   Patient Name: Caitlin Bryant MRN: [id number] DOB:03/22/1981, 42 y.o., female Today's Date: 12/08/2022  PCP: Carlean Jews, NP  REFERRING PROVIDER: Fanny Dance, MD   END OF SESSION:   PT End of Session - 12/08/22 1549     Visit Number 13    Number of Visits 17    Date for PT Re-Evaluation 01/13/23    Authorization Type [employer]    Authorization - Visit Number 13    Authorization - Number of Visits 30    PT Start Time 1550   late check in   PT Stop Time 1630    PT Time Calculation (min) 40 min    Activity Tolerance Patient tolerated treatment well    Behavior During Therapy WFL for tasks assessed/performed              Past Medical History:  Diagnosis Date   Anxiety    Headache    MRSA (methicillin resistant staph aureus) culture positive    5 or more years ago, right thumb   Past Surgical History:  Procedure Laterality Date   ANKLE ARTHROSCOPY WITH RECONSTRUCTION Left 08/19/2021   Procedure: LEFT ANKLE ARTHROSCOPY, ARTHROSCOPIC TREATMENT OF TALUS OSTEOCHONDRAL LESION, LATERAL LIGAMENT RECONSTRUCTION, PERONEAL TENDON DEBRIDEMENT, REPAIR OF DISLOCATING PERONEAL TENDONS;  Surgeon: Terance Hart, MD;  Location: [facility];  Service: Orthopedics;  Laterality: Left;  LENGTH OF SURGERY: 120 MINUTES   CESAREAN SECTION  2004 and 2008   x 2   DIAGNOSTIC LAPAROSCOPY     TONSILLECTOMY     Patient Active Problem List   Diagnosis Date Noted   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 05/24/2022   Left peroneal nerve injury 05/24/2022   Cutaneous candidiasis 05/24/2022   BMI 34.0-34.9,adult 09/28/2021   Left foot pain 09/28/2021   Overweight with body mass index (BMI) 25.0-29.9 06/25/2021   Acute right-sided low back pain with right-sided sciatica 06/15/2021   Hip pain 06/15/2021   Disorder of lumbosacral intervertebral disc 06/15/2021   Chronic pain of left ankle 06/08/2021    Routine general medical examination at a health care facility 03/17/2021   Encounter to establish care 02/23/2021   Other fatigue 02/23/2021   Viral upper respiratory infection 12/04/2020   Acute bilateral low back pain without sciatica 06/09/2019   Sore throat 06/09/2018   Left knee pain 04/20/2018   Impacted cerumen of right ear 04/15/2017   Acute non-recurrent pansinusitis 10/09/2016   Neck swelling 08/07/2016   Mouth sores 07/24/2015   Depression 07/24/2015   TMJ dysfunction 07/03/2015   Acute confusional migraine 05/10/2015   Ankle instability, left 02/22/2015   Generalized anxiety disorder 01/31/2015   Allergic rhinitis 01/31/2015    REFERRING DIAG: W09.811 (ICD-10-CM) - Complex regional pain syndrome type 1 of left lower extremity   THERAPY DIAG:  Pain in left ankle and joints of left foot  Other abnormalities of gait and mobility  Paresthesia of skin  Rationale for Evaluation and Treatment Rehabilitation  PERTINENT HISTORY: multiple MSK issues, migraines, anxiety   PRECAUTIONS: None  SUBJECTIVE:  SUBJECTIVE STATEMENT:  Pt states that she has some increased pain that she woke up with today.  Follows up with provider tomorrow, states she was also supposed to get a LE [place] but has not yet heard about scheduling for it, states she will ask provider about it tomorrow. Denies any changes in symptoms overall  PAIN:  Are you having pain: a little stiff and painful, 8/10 pain  Location: L hip, knee, ankle, foot, and toes How would you describe your pain? Burning, shooting, tingling Best in past week: 3/10 (4/10 on eval) Worst in past week: 8/10 Aggravating factors: WB, movement, touch, difficulty sleeping due to positioning  Easing factors: positioning (especially dependent positioning)     OBJECTIVE: (objective measures completed at initial evaluation unless otherwise dated)   DIAGNOSTIC FINDINGS:  Pre surgical MRI showing damaged cartilage and loosened ligaments, pt reports multiple EMGs and other imaging since surgery. Unable to view these in epic. Ankle scope Dec 2022. Per referral pt with suspected CRPS.    PATIENT SURVEYS:  FOTO 34% current, 56% predicted   10/05/2022 41% 11/18/22: 40%   COGNITION: Overall cognitive status: Within functional limits for tasks assessed                         SENSATION: Globally reduced sensation distal to knee - tingling to light touch dorsal L foot, hallux, and lateral foot With rough towel, increased tingling lateral foot and dorsal foot compared to light touch Increased burning medial calf and toes after sensation testing   EDEMA:  Mild edema throughout L foot dorsally and achilles tendon     PALPATION: TTP throughout ankle, foot, and lower limb globally    LOWER EXTREMITY ROM:    Active ROM Right eval Left eval Left 11/17/22   Knee flexion       Knee extension       Ankle dorsiflexion deg   Ankle plantarflexion   46 45 deg  Ankle inversion 50 32 30 deg  Ankle eversion 35 25 28 deg   (Blank rows = not tested) (Key: WFL = within functional limits not formally assessed, * = concordant pain, s = stiffness/stretching sensation, NT = not tested)  Comments:     LOWER EXTREMITY MMT:   MMT Right eval Left eval  Knee flexion      Knee extension      Ankle dorsiflexion      Ankle plantarflexion      Ankle inversion      Ankle eversion       (Blank rows = not tested) Comments:     (Blank rows = not tested) (Key: WFL = within functional limits not formally assessed, * = concordant pain, s = stiffness/stretching sensation, NT = not tested)  Comments:       FUNCTIONAL TESTS:  Sit to stand: B UE support from standard surface, inc WB through RLE   11/18/22:  STS from mat - no UE support, mild hesitancy  initially but no increase in pain reported, grossly symmetrical WB Floor transfer - tall kneel>half kneel>LLE lunge with UE support. Education + cues for improved mechanics, SBA. Performs safely w/ increased L thigh pain   GAIT: Distance walked: within clinic  Assistive device utilized: None Level of assistance: Complete Independence Comments: antalgic gait LLE, reduced truncal rotation B, partial step through pattern leading with RLE     TODAY'S TREATMENT:   [facility] Adult PT Treatment:  DATE: 12/08/22 Neuromuscular re-ed: Laterality recognition (L/R discrimination in Recognise Foot) Trial 1 Basic 5 sec, 20 images: L 2.07 sec 100%, R 2.07 sec 90% Trial 2 Basic 3 sec 20 images: L 1.57 sec 80%, R 1.8sec 80% Trial 3 basic 3 sec 20 images: L 1.64 sec 100%, R 1.23 sec 100% Trial 4 basic 3 sec 20 images: L 1.78 sec 100%, R 1.47 sec 90 % Trial 1 Context 5 sec 20 images: L 2.23 80%; R 2.16 sec 100% accuracy  Mirror therapy - heel/toe raises RLE only with mirror blocking LLE, 2x10, cues for pacing. No LLE movement incorporated today Significant time spent w/ PNE, discussion re: relevant anatomy/physiology, rationale for interventions   [facility] Adult PT Treatment:                                                DATE: 11/26/22 Neuromuscular re-ed: Graded Motor Imagery (GMI) Laterality recognition (left/right discrimination) with Recognise Foot app - activation of pre-motor cortex to establish cortical representation of foot - aiming for 80% accuracy and 1.5-2.5 seconds - patient able to achieve 90% accurate on LLE, 1.74 seconds today Mirror therapy: closed eyes and imaging limbs and movements, then just staring at limb, then moving to ankle pumps,inv/ev - making sure to keep LLE out of view (patient reports nausea with inv/ev, feels confusion over movement and feeling pain in LLE) Self Care: Continued PNE, activity modification, symptom management      [facility]  Adult PT Treatment:                                                DATE: 11/18/22  Therapeutic Activity: Floor transfer (see above) STS transfer no UE support FOTO + education MSK assessment + education Significant time spent w/ pt education re: relevant anatomy/physiology, gradual progression of activity, activity modification/pacing, symptom response to activity, PT progress/POC  PATIENT EDUCATION:  Education details: rationale for interventions, relevant anatomy/physiology,activity modification/pacing, PNE Person educated: Patient Education method: Explanation, Demonstration, Tactile cues, Verbal cues, handout Education comprehension: verbalized understanding, returned demonstration, verbal cues required, tactile cues required, and needs further education     HOME EXERCISE PROGRAM:  Access Code: [email or web address] URL: [email or web address] Date: 10/14/2022 Prepared by: Fransisco Hertz  Exercises - Seated Heel Toe Raises  - 1 x daily - 7 x weekly - 2 sets - 10 reps - Towel Scrunches  - 1 x daily - 7 x weekly - 2 sets - 10 reps - Supine Ankle Circles  - 1 x daily - 7 x weekly - 2 sets - 10 reps  Patient Education - Rubbing with Different Textures  ASSESSMENT:   CLINICAL IMPRESSION: Pt arrives w/ 8/10 pain, continues to endorse similar symptoms. Follows up with provider tomorrow. Today able to progress graded motor imagery for increased complexity with repetition (see interventions section above), pt tolerates well. During rest breaks provided education on rationale for interventions, relevant anatomy/physiology, and PNE with family present (with pt consent). Also discussed gradual progression of Graded Motor Imagery (GMI) and relevant variables. Mirror feedback therapy with improved tolerance today (although did not incorporate LLE movement given report of limited tolerance last session), pt with some mild increase in LLE tingling on second set but overall no  adverse symptoms. No  adverse events, pt departs with no overt increase in pain. Will plan to increase independence w/ these interventions in coming sessions to facilitate increased self efficacy with management post discharge. Pt departs today's session in no acute distress, all voiced questions/concerns addressed appropriately from PT perspective.     OBJECTIVE IMPAIRMENTS: Abnormal gait, decreased activity tolerance, decreased balance, decreased endurance, decreased mobility, difficulty walking, decreased ROM, decreased strength, hypomobility, increased edema, impaired perceived functional ability, impaired sensation, postural dysfunction, and pain.    ACTIVITY LIMITATIONS: carrying, lifting, bending, sitting, standing, squatting, sleeping, stairs, transfers, and locomotion level   PARTICIPATION LIMITATIONS: meal prep, cleaning, laundry, driving, shopping, community activity, and occupation   PERSONAL FACTORS: Time since onset of injury/illness/exacerbation and 1 comorbidity: anxiety/depression  are also affecting patient's functional outcome.    REHAB POTENTIAL: Fair given anatomy/physiology of condition and comorbidity   CLINICAL DECISION MAKING: Evolving/moderate complexity   EVALUATION COMPLEXITY: Moderate     GOALS: Goals reviewed with patient? No   SHORT TERM GOALS: Target date: 10/15/2022     Pt will demonstrate appropriate understanding and performance of initially prescribed HEP in order to facilitate improved independence with management of symptoms.  Baseline: HEP provided on eval 10/14/22: reports good compliance w/ HEP Goal status: MET   2. Pt will score greater than or equal to 44% on FOTO in order to demonstrate improved perception of function due to symptoms.            Baseline: 34%  10/05/22 41%            Goal status: ONGOING     LONG TERM GOALS: Target date: 12/30/2022 (updated 11/18/22) Pt will score 56% or greater on FOTO in order to demonstrate improved perception of function due to  symptoms.  Baseline: 34% 11/18/22: 40%  Goal status: ONGOING   2.  Pt will demonstrate at least 10 degrees of L ankle DF AROM in order to facilitate improved tolerance to functional movements such as transfers/gait.  Baseline: see ROM chart above 11/18/22: see ROM chart above Goal status: ONGOING   3.  Pt will be able to perform floor transfer w/ less than 2pt increase in resting pain in order to promote increased tolerance to work activities.  Baseline: unable to perform  11/18/22: able to perform w/ UE support and increase in L thigh pain  Goal status: ONGOING   4.  Pt will be able to perform sit to stand transfer without UE support and less than 2pt increase in resting pain to improve safety w/ transfers.  Baseline: B UE support and altered mechanics 11/18/22: no UE support, some hesitancy but no increase in pain   Goal status: ONGOING      PLAN (updated 11/18/22):   PT FREQUENCY: 1x/week   PT DURATION: 6 weeks   PLANNED INTERVENTIONS: Therapeutic exercises, Therapeutic activity, Neuromuscular re-education, Balance training, Gait training, Patient/Family education, Self Care, Joint mobilization, Stair training, Aquatic Therapy, Electrical stimulation, Cryotherapy, Moist heat, Taping, Manual therapy, and Re-evaluation   PLAN FOR NEXT SESSION: motor imagery, mirror feedback. Facilitate increased independence with these interventions  Ashley Murrain PT, DPT 12/08/2022 5:35 PM

## 2022-12-08 ENCOUNTER — Ambulatory Visit: Payer: 59 | Admitting: Physical Therapy

## 2022-12-08 ENCOUNTER — Encounter: Payer: Self-pay | Admitting: Physical Therapy

## 2022-12-08 DIAGNOSIS — R202 Paresthesia of skin: Secondary | ICD-10-CM

## 2022-12-08 DIAGNOSIS — R2689 Other abnormalities of gait and mobility: Secondary | ICD-10-CM | POA: Diagnosis not present

## 2022-12-08 DIAGNOSIS — M25572 Pain in left ankle and joints of left foot: Secondary | ICD-10-CM | POA: Diagnosis not present

## 2022-12-09 ENCOUNTER — Telehealth: Payer: Self-pay | Admitting: *Deleted

## 2022-12-09 DIAGNOSIS — G90522 Complex regional pain syndrome I of left lower limb: Secondary | ICD-10-CM | POA: Diagnosis not present

## 2022-12-09 NOTE — Telephone Encounter (Signed)
Urine drug screen for this encounter is consistent for prescribed medication 

## 2022-12-10 ENCOUNTER — Telehealth: Payer: Self-pay | Admitting: Physical Medicine & Rehabilitation

## 2022-12-10 NOTE — Telephone Encounter (Signed)
Patient called about an order for ultrasound, but didn't see any order or referral in system.  Your note does state it, but I don't see an order.  Please put order in if patient needs this done.

## 2022-12-16 ENCOUNTER — Encounter: Payer: Self-pay | Admitting: Nurse Practitioner

## 2022-12-16 ENCOUNTER — Ambulatory Visit (HOSPITAL_COMMUNITY)
Admission: RE | Admit: 2022-12-16 | Discharge: 2022-12-16 | Disposition: A | Discharge status: Home / Self Care | Payer: 59 | Source: Ambulatory Visit | Attending: Physical Medicine & Rehabilitation | Admitting: Physical Medicine & Rehabilitation

## 2022-12-16 ENCOUNTER — Ambulatory Visit (INDEPENDENT_AMBULATORY_CARE_PROVIDER_SITE_OTHER): Payer: 59 | Admitting: Nurse Practitioner

## 2022-12-16 VITALS — BP 129/83 | HR 99 | Ht 63.0 in | Wt 201.4 lb

## 2022-12-16 DIAGNOSIS — M7989 Other specified soft tissue disorders: Secondary | ICD-10-CM | POA: Diagnosis not present

## 2022-12-16 DIAGNOSIS — F411 Generalized anxiety disorder: Secondary | ICD-10-CM | POA: Diagnosis not present

## 2022-12-16 DIAGNOSIS — S8412XA Injury of peroneal nerve at lower leg level, left leg, initial encounter: Secondary | ICD-10-CM

## 2022-12-16 DIAGNOSIS — J3089 Other allergic rhinitis: Secondary | ICD-10-CM

## 2022-12-16 MED ORDER — MONTELUKAST SODIUM 10 MG PO TABS: 10.0000 mg | ORAL_TABLET | Freq: Every day | ORAL | 3 refills | Status: DC

## 2022-12-16 MED ORDER — AZELASTINE HCL 0.1 % NA SOLN: 2.0000 | Freq: Two times a day (BID) | NASAL | 1 refills | Status: DC

## 2022-12-16 MED ORDER — DULOXETINE HCL 30 MG PO CPEP
30.0000 mg | ORAL_CAPSULE | Freq: Every day | ORAL | 0 refills | Status: DC
Start: 2022-12-16 — End: 2023-01-20

## 2022-12-16 NOTE — Progress Notes (Signed)
Established patient visit   Patient: Caitlin Bryant   DOB: 09-08-1980   42 y.o. Female  MRN: [id number] Visit Date: 12/16/2022   Chief Complaint  Patient presents with   Medical Management of Chronic Issues   Subjective    HPI  Follow up  -GAD --increased sertraline to 100 mg daily  States that she is doing "alright."  -has also seen pain management provider  -she states that pain management provider suggested the maybe she try taking medication for pain and depression.   -seeing neurosurgery for pain in left ankle and foot.  -will have ultrasound today  -will have procedure done to "reset" the nerves of the foot to improve sensation, pain, and movement.   Severe allergies.  -taking OTC cetirizine and flonase  -not really working very well.    Medications: Outpatient Medications Prior to Visit  Medication Sig   cetirizine (ZYRTEC) 10 MG tablet Take 10 mg by mouth daily.   fluticasone (FLONASE) 50 MCG/ACT nasal spray Place into both nostrils daily.   [name] 1/20 1-20 MG-MCG tablet Take 1 tablet by mouth daily.   naproxen (NAPROSYN) 250 MG tablet Take 1 tablet (250 mg total) by mouth 2 (two) times daily with a meal.   ondansetron (ZOFRAN ODT) 4 MG disintegrating tablet Take 1 tablet (4 mg total) by mouth every 8 (eight) hours as needed for nausea or vomiting.   rizatriptan (MAXALT) 10 MG tablet Take 1 tablet (10 mg total) by mouth as needed for migraine. May repeat in 2 hours if needed   sertraline (ZOLOFT) 100 MG tablet Take 1 tablet (100 mg total) by mouth daily.   sertraline (ZOLOFT) 50 MG tablet TAKE 1 TABLET BY MOUTH DAILY   traMADol (ULTRAM) 50 MG tablet Take 1 tablet (50 mg total) by mouth every 12 (twelve) hours as needed.   [DISCONTINUED] lidocaine (LMX) 4 % cream Apply 1 Application topically as needed.   [DISCONTINUED] pregabalin (LYRICA) 75 MG capsule Take 1 capsule (75 mg total) by mouth at bedtime.   No facility-administered medications prior to visit.     Review of Systems See HPI    Last CBC Lab Results  Component Value Date   WBC 6.5 03/23/2022   HGB 13.6 03/23/2022   HCT 41.2 03/23/2022   MCV 86 03/23/2022   MCH 28.4 03/23/2022   RDW 12.7 03/23/2022   PLT 224 03/23/2022   Last metabolic panel Lab Results  Component Value Date   GLUCOSE 96 03/23/2022   NA 140 03/23/2022   K 4.1 03/23/2022   CL 105 03/23/2022   CO2 21 03/23/2022   BUN 10 03/23/2022   CREATININE 0.70 03/23/2022   EGFR 111 03/23/2022   CALCIUM 9.1 03/23/2022   PROT 7.3 03/23/2022   ALBUMIN 4.4 03/23/2022   LABGLOB 2.9 03/23/2022   AGRATIO 1.5 03/23/2022   BILITOT 0.4 03/23/2022   ALKPHOS 79 03/23/2022   AST 13 03/23/2022   ALT 6 03/23/2022   Last lipids Lab Results  Component Value Date   CHOL 161 03/23/2022   HDL 49 03/23/2022   LDLCALC 91 03/23/2022   TRIG 117 03/23/2022   CHOLHDL 3.3 03/23/2022   Last hemoglobin A1c Lab Results  Component Value Date   HGBA1C 5.3 03/23/2022   Last thyroid functions Lab Results  Component Value Date   TSH 1.120 03/23/2022   Last vitamin D Lab Results  Component Value Date   VD25OH 42.9 03/13/2021       Objective  Today's Vitals   12/16/22 0935  BP: 129/83  Pulse: 99  SpO2: 98%  Weight: 201 lb 6.4 oz (91.4 kg)  Height: 5\' 3"  (1.6 m)   Body mass index is 35.68 kg/m.  BP Readings from Last 3 Encounters:  12/16/22 129/83  11/24/22 120/81  10/01/22 123/83    Wt Readings from Last 3 Encounters:  12/16/22 201 lb 6.4 oz (91.4 kg)  11/24/22 200 lb 3.2 oz (90.8 kg)  10/01/22 196 lb (88.9 kg)    Physical Exam Vitals and nursing note reviewed.  Constitutional:      Appearance: Normal appearance. She is well-developed.  HENT:     Head: Normocephalic and atraumatic.  Eyes:     Pupils: Pupils are equal, round, and reactive to light.  Cardiovascular:     Rate and Rhythm: Normal rate and regular rhythm.     Pulses: Normal pulses.     Heart sounds: Normal heart sounds.  Pulmonary:      Effort: Pulmonary effort is normal.     Breath sounds: Normal breath sounds.  Abdominal:     Palpations: Abdomen is soft.  Musculoskeletal:        General: Normal range of motion.     Cervical back: Normal range of motion and neck supple.  Lymphadenopathy:     Cervical: No cervical adenopathy.  Skin:    General: Skin is warm and dry.     Capillary Refill: Capillary refill takes less than 2 seconds.  Neurological:     General: No focal deficit present.     Mental Status: She is alert and oriented to person, place, and time.  Psychiatric:        Attention and Perception: Attention and perception normal.        Mood and Affect: Affect normal. Mood is anxious and depressed.        Speech: Speech normal.        Behavior: Behavior normal. Behavior is cooperative.        Thought Content: Thought content normal.        Cognition and Memory: Cognition and memory normal.        Judgment: Judgment normal.       Assessment & Plan    Generalized anxiety disorder Assessment & Plan: Change from sertraline to duloxetine.  -written instructions provided for her to start duloxetine and wean off sertraline.  Start duloxetine 30 mg everyday  Week 1 - sertraline - take 100 mg every other day. Alternate with 50 mg tablets  Week 2 - sertraline - take 50 mg for two days. On third day, take 100 mg - repeat this one time  Week 3 - take serttaline 50 mg daily  Week 4 - take sertraline 50 mg every other day. Alternate with no sertraline  Week 5 - take no sertraline for 2 days and on third day, take sertrlaine 50 mg. Repeat this once.  Week  6- take no sertraline.  Follow up with me and we can adjust dosing of duloxetine.    Orders: -     DULoxetine HCl; Take 1 capsule (30 mg total) by mouth daily.  Dispense: 90 capsule; Refill: 0  Injury of left peroneal nerve, initial encounter Assessment & Plan: Patient is seeing orthopedics and neurology  -trial duloxetine to help with pain as well and  GAD.   Orders: -     DULoxetine HCl; Take 1 capsule (30 mg total) by mouth daily.  Dispense: 90 capsule; Refill: 0  Seasonal allergic rhinitis due to other allergic trigger Assessment & Plan: Add singulair 10 mg daily.   Orders: -     Azelastine HCl; Place 2 sprays into both nostrils 2 (two) times daily. Use in each nostril as directed  Dispense: 30 mL; Refill: 1 -     Montelukast Sodium; Take 1 tablet (10 mg total) by mouth at bedtime.  Dispense: 30 tablet; Refill: 3     Return in about 6 weeks (around 01/27/2023) for mood. change sertraline to duloxetine .         Carlean Jews, NP  [facility] [phone] (phone) [phone] (fax)  [facility]

## 2022-12-16 NOTE — Patient Instructions (Addendum)
Start duloxetine 30 mg everyday  Week 1 - sertraline - take 100 mg every other day. Alternate with 50 mg tablets  Week 2 - sertraline - take 50 mg for two days. On third day, take 100 mg - repeat this one time  Week 3 - take serttaline 50 mg daily  Week 4 - take sertraline 50 mg every other day. Alternate with no sertraline  Week 5 - take no sertraline for 2 days and on third day, take sertrlaine 50 mg. Repeat this once.  Week  6- take no sertraline.  Follow up with me and we can adjust dosing of duloxetine.

## 2022-12-21 ENCOUNTER — Telehealth (HOSPITAL_COMMUNITY): Payer: Self-pay | Admitting: Physical Medicine & Rehabilitation

## 2022-12-21 NOTE — Telephone Encounter (Signed)
Patient called back returning your call.

## 2022-12-22 NOTE — Therapy (Signed)
OUTPATIENT PHYSICAL THERAPY TREATMENT NOTE   Patient Name: Caitlin Bryant MRN: [id number] DOB:23-Apr-1981, 42 y.o., female Today's Date: 12/23/2022  PCP: Carlean Jews, NP  REFERRING PROVIDER: Fanny Dance, MD   END OF SESSION:   PT End of Session - 12/23/22 1633     Visit Number 14    Number of Visits 17    Date for PT Re-Evaluation 01/13/23    Authorization Type [employer]    PT Start Time 1634    PT Stop Time 1715    PT Time Calculation (min) 41 min    Activity Tolerance Patient tolerated treatment well;No increased pain    Behavior During Therapy WFL for tasks assessed/performed              Past Medical History:  Diagnosis Date   Anxiety    Headache    MRSA (methicillin resistant staph aureus) culture positive    5 or more years ago, right thumb   Past Surgical History:  Procedure Laterality Date   ANKLE ARTHROSCOPY WITH RECONSTRUCTION Left 08/19/2021   Procedure: LEFT ANKLE ARTHROSCOPY, ARTHROSCOPIC TREATMENT OF TALUS OSTEOCHONDRAL LESION, LATERAL LIGAMENT RECONSTRUCTION, PERONEAL TENDON DEBRIDEMENT, REPAIR OF DISLOCATING PERONEAL TENDONS;  Surgeon: Terance Hart, MD;  Location: [facility];  Service: Orthopedics;  Laterality: Left;  LENGTH OF SURGERY: 120 MINUTES   CESAREAN SECTION  2004 and 2008   x 2   DIAGNOSTIC LAPAROSCOPY     TONSILLECTOMY     Patient Active Problem List   Diagnosis Date Noted   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 05/24/2022   Left peroneal nerve injury 05/24/2022   Cutaneous candidiasis 05/24/2022   BMI 34.0-34.9,adult 09/28/2021   Left foot pain 09/28/2021   Overweight with body mass index (BMI) 25.0-29.9 06/25/2021   Acute right-sided low back pain with right-sided sciatica 06/15/2021   Hip pain 06/15/2021   Disorder of lumbosacral intervertebral disc 06/15/2021   Chronic pain of left ankle 06/08/2021   Routine general medical examination at a health care facility 03/17/2021    Encounter to establish care 02/23/2021   Other fatigue 02/23/2021   Viral upper respiratory infection 12/04/2020   Acute bilateral low back pain without sciatica 06/09/2019   Sore throat 06/09/2018   Left knee pain 04/20/2018   Impacted cerumen of right ear 04/15/2017   Acute non-recurrent pansinusitis 10/09/2016   Neck swelling 08/07/2016   Mouth sores 07/24/2015   Depression 07/24/2015   TMJ dysfunction 07/03/2015   Acute confusional migraine 05/10/2015   Ankle instability, left 02/22/2015   Generalized anxiety disorder 01/31/2015   Allergic rhinitis 01/31/2015    REFERRING DIAG: W09.811 (ICD-10-CM) - Complex regional pain syndrome type 1 of left lower extremity   THERAPY DIAG:  Pain in left ankle and joints of left foot  Other abnormalities of gait and mobility  Paresthesia of skin  Rationale for Evaluation and Treatment Rehabilitation  PERTINENT HISTORY: multiple MSK issues, migraines, anxiety   PRECAUTIONS: None  SUBJECTIVE:  SUBJECTIVE STATEMENT:  Pt arrives w/ increased pain today, unsure of anything in particular that aggravated her symptoms but describes a busy day. States she has been active in the yard. Has been using the Recognise app on occasion, is going to get an injection for her nerve  PAIN:  Are you having pain: 5/10 pain  Location: L hip, knee, ankle, foot, and toes How would you describe your pain? Burning, shooting, tingling Best in past week: 3/10 (4/10 on eval) Worst in past week: 8/10 Aggravating factors: WB, movement, touch, difficulty sleeping due to positioning  Easing factors: positioning (especially dependent positioning)    OBJECTIVE: (objective measures completed at initial evaluation unless otherwise dated)   DIAGNOSTIC FINDINGS:  Pre surgical MRI showing  damaged cartilage and loosened ligaments, pt reports multiple EMGs and other imaging since surgery. Unable to view these in epic. Ankle scope Dec 2022. Per referral pt with suspected CRPS.    PATIENT SURVEYS:  FOTO 34% current, 56% predicted   10/05/2022 41% 11/18/22: 40%   COGNITION: Overall cognitive status: Within functional limits for tasks assessed                         SENSATION: Globally reduced sensation distal to knee - tingling to light touch dorsal L foot, hallux, and lateral foot With rough towel, increased tingling lateral foot and dorsal foot compared to light touch Increased burning medial calf and toes after sensation testing   EDEMA:  Mild edema throughout L foot dorsally and achilles tendon     PALPATION: TTP throughout ankle, foot, and lower limb globally    LOWER EXTREMITY ROM:    Active ROM Right eval Left eval Left 11/17/22   Knee flexion       Knee extension       Ankle dorsiflexion 10 2 2  deg   Ankle plantarflexion   46 45 deg  Ankle inversion 50 32 30 deg  Ankle eversion 35 25 28 deg   (Blank rows = not tested) (Key: WFL = within functional limits not formally assessed, * = concordant pain, s = stiffness/stretching sensation, NT = not tested)  Comments:     LOWER EXTREMITY MMT:   MMT Right eval Left eval  Knee flexion      Knee extension      Ankle dorsiflexion      Ankle plantarflexion      Ankle inversion      Ankle eversion       (Blank rows = not tested) Comments:     (Blank rows = not tested) (Key: WFL = within functional limits not formally assessed, * = concordant pain, s = stiffness/stretching sensation, NT = not tested)  Comments:       FUNCTIONAL TESTS:  Sit to stand: B UE support from standard surface, inc WB through RLE   11/18/22:  STS from mat - no UE support, mild hesitancy initially but no increase in pain reported, grossly symmetrical WB Floor transfer - tall kneel>half kneel>LLE lunge with UE support. Education +  cues for improved mechanics, SBA. Performs safely w/ increased L thigh pain   GAIT: Distance walked: within clinic  Assistive device utilized: None Level of assistance: Complete Independence Comments: antalgic gait LLE, reduced truncal rotation B, partial step through pattern leading with RLE     TODAY'S TREATMENT:   [facility] Adult PT Treatment:  DATE: 12/23/22 Neuromuscular re-ed: Mirror therapy (viewing RLE, LLE blocked) RLE DF x15 seated RLE PF x15 seated RLE seated inv/eversion towel slides x15  RLE seated towel scrunches x15 BIL synchronized eversion/inversion x10 BIL synchronized DF seated x10  BIL synchronized PF seated x10  Significant time spent with PNE, education on relevant anatomy/physiology, rationale for interventions  [facility] Adult PT Treatment:                                                DATE: 12/08/22 Neuromuscular re-ed: Laterality recognition (L/R discrimination in Recognise Foot) Trial 1 Basic 5 sec, 20 images: L 2.07 sec 100%, R 2.07 sec 90% Trial 2 Basic 3 sec 20 images: L 1.57 sec 80%, R 1.8sec 80% Trial 3 basic 3 sec 20 images: L 1.64 sec 100%, R 1.23 sec 100% Trial 4 basic 3 sec 20 images: L 1.78 sec 100%, R 1.47 sec 90 % Trial 1 Context 5 sec 20 images: L 2.23 80%; R 2.16 sec 100% accuracy  Mirror therapy - heel/toe raises RLE only with mirror blocking LLE, 2x10, cues for pacing. No LLE movement incorporated today Significant time spent w/ PNE, discussion re: relevant anatomy/physiology, rationale for interventions   [facility] Adult PT Treatment:                                                DATE: 11/26/22 Neuromuscular re-ed: Graded Motor Imagery (GMI) Laterality recognition (left/right discrimination) with Recognise Foot app - activation of pre-motor cortex to establish cortical representation of foot - aiming for 80% accuracy and 1.5-2.5 seconds - patient able to achieve 90% accurate on LLE, 1.74 seconds  today Mirror therapy: closed eyes and imaging limbs and movements, then just staring at limb, then moving to ankle pumps,inv/ev - making sure to keep LLE out of view (patient reports nausea with inv/ev, feels confusion over movement and feeling pain in LLE) Self Care: Continued PNE, activity modification, symptom management   PATIENT EDUCATION:  Education details: rationale for interventions, relevant anatomy/physiology,activity modification/pacing, PNE Person educated: Patient Education method: Explanation, Demonstration, Tactile cues, Verbal cues, handout Education comprehension: verbalized understanding, returned demonstration, verbal cues required, tactile cues required, and needs further education     HOME EXERCISE PROGRAM:  Access Code: [email or web address] URL: [email or web address] Date: 10/14/2022 Prepared by: Fransisco Hertz  Exercises - Seated Heel Toe Raises  - 1 x daily - 7 x weekly - 2 sets - 10 reps - Towel Scrunches  - 1 x daily - 7 x weekly - 2 sets - 10 reps - Supine Ankle Circles  - 1 x daily - 7 x weekly - 2 sets - 10 reps  Patient Education - Rubbing with Different Textures  ASSESSMENT:   CLINICAL IMPRESSION: Pt arrives w/ 5/10 pain, today focusing on progression with mirror therapy with emphasis on uniplanar movements with pt noting improved tolerance to previous sessions, no adverse symptoms and no increases in pain. Followed mirror therapy with synchronized LE movement in order to improve motor learning in context of less painful movement. Pt tolerates well without any increases in resting pain. Significant time with education throughout, emphasis on maintaining graded motor imagery/mirror therapy regimen at home, relevant anatomy/physiology, and pain neuroscience education. No adverse events, no  increase in pain on departure. Plan for tentative discharge next visit. Pt departs today's session in no acute distress, all voiced questions/concerns addressed  appropriately from PT perspective.     OBJECTIVE IMPAIRMENTS: Abnormal gait, decreased activity tolerance, decreased balance, decreased endurance, decreased mobility, difficulty walking, decreased ROM, decreased strength, hypomobility, increased edema, impaired perceived functional ability, impaired sensation, postural dysfunction, and pain.    ACTIVITY LIMITATIONS: carrying, lifting, bending, sitting, standing, squatting, sleeping, stairs, transfers, and locomotion level   PARTICIPATION LIMITATIONS: meal prep, cleaning, laundry, driving, shopping, community activity, and occupation   PERSONAL FACTORS: Time since onset of injury/illness/exacerbation and 1 comorbidity: anxiety/depression  are also affecting patient's functional outcome.    REHAB POTENTIAL: Fair given anatomy/physiology of condition and comorbidity   CLINICAL DECISION MAKING: Evolving/moderate complexity   EVALUATION COMPLEXITY: Moderate     GOALS: Goals reviewed with patient? No   SHORT TERM GOALS: Target date: 10/15/2022     Pt will demonstrate appropriate understanding and performance of initially prescribed HEP in order to facilitate improved independence with management of symptoms.  Baseline: HEP provided on eval 10/14/22: reports good compliance w/ HEP Goal status: MET   2. Pt will score greater than or equal to 44% on FOTO in order to demonstrate improved perception of function due to symptoms.            Baseline: 34%  10/05/22 41%            Goal status: ONGOING     LONG TERM GOALS: Target date: 12/30/2022 (updated 11/18/22) Pt will score 56% or greater on FOTO in order to demonstrate improved perception of function due to symptoms.  Baseline: 34% 11/18/22: 40%  Goal status: ONGOING   2.  Pt will demonstrate at least 10 degrees of L ankle DF AROM in order to facilitate improved tolerance to functional movements such as transfers/gait.  Baseline: see ROM chart above 11/18/22: see ROM chart above Goal  status: ONGOING   3.  Pt will be able to perform floor transfer w/ less than 2pt increase in resting pain in order to promote increased tolerance to work activities.  Baseline: unable to perform  11/18/22: able to perform w/ UE support and increase in L thigh pain  Goal status: ONGOING   4.  Pt will be able to perform sit to stand transfer without UE support and less than 2pt increase in resting pain to improve safety w/ transfers.  Baseline: B UE support and altered mechanics 11/18/22: no UE support, some hesitancy but no increase in pain   Goal status: ONGOING      PLAN (updated 11/18/22):   PT FREQUENCY: 1x/week   PT DURATION: 6 weeks   PLANNED INTERVENTIONS: Therapeutic exercises, Therapeutic activity, Neuromuscular re-education, Balance training, Gait training, Patient/Family education, Self Care, Joint mobilization, Stair training, Aquatic Therapy, Electrical stimulation, Cryotherapy, Moist heat, Taping, Manual therapy, and Re-evaluation   PLAN FOR NEXT SESSION: continue to facilitate improved independence with HEP, motor imagery, and mirror therapy. Plan for tentative discharge  Ashley Murrain PT, DPT 12/23/2022 5:31 PM

## 2022-12-23 ENCOUNTER — Ambulatory Visit: Payer: 59 | Attending: Physical Medicine & Rehabilitation | Admitting: Physical Therapy

## 2022-12-23 ENCOUNTER — Encounter: Payer: Self-pay | Admitting: Physical Therapy

## 2022-12-23 DIAGNOSIS — M25572 Pain in left ankle and joints of left foot: Secondary | ICD-10-CM

## 2022-12-23 DIAGNOSIS — R2689 Other abnormalities of gait and mobility: Secondary | ICD-10-CM | POA: Diagnosis not present

## 2022-12-23 DIAGNOSIS — R202 Paresthesia of skin: Secondary | ICD-10-CM | POA: Diagnosis not present

## 2022-12-30 ENCOUNTER — Ambulatory Visit: Payer: 59 | Admitting: Physical Therapy

## 2022-12-30 ENCOUNTER — Encounter: Payer: Self-pay | Admitting: Physical Therapy

## 2022-12-30 DIAGNOSIS — R202 Paresthesia of skin: Secondary | ICD-10-CM

## 2022-12-30 DIAGNOSIS — M25572 Pain in left ankle and joints of left foot: Secondary | ICD-10-CM | POA: Diagnosis not present

## 2022-12-30 DIAGNOSIS — R2689 Other abnormalities of gait and mobility: Secondary | ICD-10-CM

## 2022-12-30 NOTE — Therapy (Signed)
OUTPATIENT PHYSICAL THERAPY TREATMENT NOTE + DISCHARGE SUMMARY   Patient Name: Caitlin Bryant MRN: [id number] DOB:December 09, 1980, 42 y.o., female Today's Date: 12/30/2022  PCP: Carlean Jews, NP  REFERRING PROVIDER: Fanny Dance, MD   PHYSICAL THERAPY DISCHARGE SUMMARY  Visits from Start of Care: 15  Current functional level related to goals / functional outcomes: Continues w/ pain, variable, affects daily activities but denies overt limitations   Remaining deficits: Pain, swelling, weakness - see below   Education / Equipment: HEP, discharge education, follow up with provider, seeking new referral if PT services indicated going forward  Patient agrees to discharge. Patient goals were partially met. Patient is being discharged due to maximized rehab potential at this time.   END OF SESSION:   PT End of Session - 12/30/22 1455     Visit Number 15    Number of Visits 17    Authorization Type [employer]    PT Start Time 1456    PT Stop Time 1541    PT Time Calculation (min) 45 min    Activity Tolerance Patient tolerated treatment well;No increased pain    Behavior During Therapy WFL for tasks assessed/performed               Past Medical History:  Diagnosis Date   Anxiety    Headache    MRSA (methicillin resistant staph aureus) culture positive    5 or more years ago, right thumb   Past Surgical History:  Procedure Laterality Date   ANKLE ARTHROSCOPY WITH RECONSTRUCTION Left 08/19/2021   Procedure: LEFT ANKLE ARTHROSCOPY, ARTHROSCOPIC TREATMENT OF TALUS OSTEOCHONDRAL LESION, LATERAL LIGAMENT RECONSTRUCTION, PERONEAL TENDON DEBRIDEMENT, REPAIR OF DISLOCATING PERONEAL TENDONS;  Surgeon: Terance Hart, MD;  Location: [facility];  Service: Orthopedics;  Laterality: Left;  LENGTH OF SURGERY: 120 MINUTES   CESAREAN SECTION  2004 and 2008   x 2   DIAGNOSTIC LAPAROSCOPY     TONSILLECTOMY     Patient Active Problem List   Diagnosis Date  Noted   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 05/24/2022   Left peroneal nerve injury 05/24/2022   Cutaneous candidiasis 05/24/2022   BMI 34.0-34.9,adult 09/28/2021   Left foot pain 09/28/2021   Overweight with body mass index (BMI) 25.0-29.9 06/25/2021   Acute right-sided low back pain with right-sided sciatica 06/15/2021   Hip pain 06/15/2021   Disorder of lumbosacral intervertebral disc 06/15/2021   Chronic pain of left ankle 06/08/2021   Routine general medical examination at a health care facility 03/17/2021   Encounter to establish care 02/23/2021   Other fatigue 02/23/2021   Viral upper respiratory infection 12/04/2020   Acute bilateral low back pain without sciatica 06/09/2019   Sore throat 06/09/2018   Left knee pain 04/20/2018   Impacted cerumen of right ear 04/15/2017   Acute non-recurrent pansinusitis 10/09/2016   Neck swelling 08/07/2016   Mouth sores 07/24/2015   Depression 07/24/2015   TMJ dysfunction 07/03/2015   Acute confusional migraine 05/10/2015   Ankle instability, left 02/22/2015   Generalized anxiety disorder 01/31/2015   Allergic rhinitis 01/31/2015    REFERRING DIAG: Q65.784 (ICD-10-CM) - Complex regional pain syndrome type 1 of left lower extremity   THERAPY DIAG:  Pain in left ankle and joints of left foot  Other abnormalities of gait and mobility  Paresthesia of skin  Rationale for Evaluation and Treatment Rehabilitation  PERTINENT HISTORY: multiple MSK issues, migraines, anxiety   PRECAUTIONS: None  SUBJECTIVE:  SUBJECTIVE STATEMENT:  Pt states she is having more pain/tingling today which she attributes to wearing jeans. States she felt about the same after last session. Continues to have pain, difficulty sleeping. Pain mostly unchanged since  start of therapy but pt states she feels more confident with mobility/functional activities  PAIN:  Are you having pain: 6/10 pain  Location: L hip, knee, ankle, foot, and toes How would you describe your pain? Burning, shooting, tingling Best in past week:  3/10 (4/10 on eval) Worst in past week: 8/10 (8/10 on eval) Aggravating factors: WB, movement, touch, difficulty sleeping due to positioning  Easing factors: positioning (especially dependent positioning)    OBJECTIVE: (objective measures completed at initial evaluation unless otherwise dated)   DIAGNOSTIC FINDINGS:  Pre surgical MRI showing damaged cartilage and loosened ligaments, pt reports multiple EMGs and other imaging since surgery. Unable to view these in epic. Ankle scope Dec 2022. Per referral pt with suspected CRPS.    PATIENT SURVEYS:  FOTO 34% current, 56% predicted   10/05/2022 41% 11/18/22: 40%  12/30/22: 36%   COGNITION: Overall cognitive status: Within functional limits for tasks assessed                         SENSATION: Globally reduced sensation distal to knee - tingling to light touch dorsal L foot, hallux, and lateral foot With rough towel, increased tingling lateral foot and dorsal foot compared to light touch Increased burning medial calf and toes after sensation testing   EDEMA:  Mild edema throughout L foot dorsally and achilles tendon     PALPATION: TTP throughout ankle, foot, and lower limb globally    LOWER EXTREMITY ROM:    Active ROM Right eval Left eval Left 11/17/22  Left 12/30/22   Knee flexion        Knee extension        Ankle dorsiflexion 10 2 2  deg  12 deg  Ankle plantarflexion   46 45 deg   Ankle inversion 50 32 30 deg 40 deg  Ankle eversion 35 25 28 deg 32 deg   (Blank rows = not tested) (Key: WFL = within functional limits not formally assessed, * = concordant pain, s = stiffness/stretching sensation, NT = not tested)  Comments:     LOWER EXTREMITY MMT:   MMT Right eval  Left eval  Knee flexion      Knee extension      Ankle dorsiflexion      Ankle plantarflexion      Ankle inversion      Ankle eversion       (Blank rows = not tested) Comments:     (Blank rows = not tested) (Key: WFL = within functional limits not formally assessed, * = concordant pain, s = stiffness/stretching sensation, NT = not tested)  Comments:       FUNCTIONAL TESTS:  Sit to stand: B UE support from standard surface, inc WB through RLE   11/18/22:  STS from mat - no UE support, mild hesitancy initially but no increase in pain reported, grossly symmetrical WB Floor transfer - tall kneel>half kneel>LLE lunge with UE support. Education + cues for improved mechanics, SBA. Performs safely w/ increased L thigh pain   GAIT: Distance walked: within clinic  Assistive device utilized: None Level of assistance: Complete Independence Comments: antalgic gait LLE, reduced truncal rotation B, partial step through pattern leading with RLE     TODAY'S TREATMENT:   [facility]  Adult PT Treatment:                                                DATE: 12/30/22 Therapeutic Exercise: Heel/toe raises seated x10 Inversion/eversion towel slides x10 Ankle circles x10 CW/CCW LAQ x10 Towel scrunches x10  STS x8  HEP handout + education  Therapeutic Activity: MSK assessment + education FOTO + education Education on activity modification, safety w/ activity, monitoring symptoms, follow up with provider and strategies for symptom modulation and pain management Education re: mirror therapy, laterality training and graded motor imagery    PATIENT EDUCATION:  Education details: rationale for interventions, relevant anatomy/physiology,activity modification/pacing, PNE, HEP, discharge education, follow up with provider, seeking new referral if therapy services indicated going forward Person educated: Patient Education method: Explanation, Demonstration, Tactile cues, Verbal cues, handout Education  comprehension: verbalized understanding, returned demonstration, verbal cues required, tactile cues required, and needs further education     HOME EXERCISE PROGRAM: Access Code: [email or web address] URL: [email or web address] Date: 12/30/2022 Prepared by: Fransisco Hertz  Exercises - Seated Heel Toe Raises  - 1 x daily - 7 x weekly - 2 sets - 10 reps - Ankle Inversion Eversion Towel Slide  - 1 x daily - 7 x weekly - 2 sets - 10 reps - Supine Ankle Circles  - 1 x daily - 7 x weekly - 2 sets - 10 reps - Seated Long Arc Quad  - 1 x daily - 7 x weekly - 2 sets - 10 reps - Towel Scrunches  - 1 x daily - 7 x weekly - 2 sets - 10 reps - Sit to Stand with Armchair  - 1 x daily - 7 x weekly - 2 sets - 8 reps  Patient Education - Rubbing with Different Textures  ASSESSMENT:   CLINICAL IMPRESSION: Pt arrives w/ 6/10 pain on NPS, continues to endorse high pain levels at times that remains comparable to start of therapy. On today's exam pt demos good improvement in L ankle ROM which she attributes to taking a walk prior to PT session. Looking at LTG today pt has met majority of functional goals despite continued fluctuations in pain levels. In recent sessions we have performed and educated on graded motor imagery, lateralization training, and mirror therapy - today pt verbalizes understanding of these modalities and states she feels confident utilizing them independently going forward. HEP performed in clinic without mirror, educated on appropriate usage with mirror. No adverse events. During discharge education, pt mentions having had generalized nausea, dizziness, feeling flushed, and stomach pain that started over the weekend, has improved today. She also notes having started a new medication on Saturday. Advised pt to reach out to PCP re: these symptoms for guidance, she verbalizes agreement. Pt verbalizes agreement/understanding with plan at this time; given lack of change in pain levels and therapy  prognosis with medical condition, recommend discharge to independent HEP and further medical follow up. Pt departs today's session in no acute distress, all voiced questions/concerns addressed appropriately from PT perspective.      OBJECTIVE IMPAIRMENTS: Abnormal gait, decreased activity tolerance, decreased balance, decreased endurance, decreased mobility, difficulty walking, decreased ROM, decreased strength, hypomobility, increased edema, impaired perceived functional ability, impaired sensation, postural dysfunction, and pain.    ACTIVITY LIMITATIONS: carrying, lifting, bending, sitting, standing, squatting, sleeping, stairs, transfers, and locomotion level   PARTICIPATION LIMITATIONS: meal prep,  cleaning, laundry, driving, shopping, community activity, and occupation   PERSONAL FACTORS: Time since onset of injury/illness/exacerbation and 1 comorbidity: anxiety/depression  are also affecting patient's functional outcome.    REHAB POTENTIAL: Fair given anatomy/physiology of condition and comorbidity   CLINICAL DECISION MAKING: Evolving/moderate complexity   EVALUATION COMPLEXITY: Moderate     GOALS: Goals reviewed with patient? No   SHORT TERM GOALS: Target date: 10/15/2022     Pt will demonstrate appropriate understanding and performance of initially prescribed HEP in order to facilitate improved independence with management of symptoms.  Baseline: HEP provided on eval 10/14/22: reports good compliance w/ HEP Goal status: MET   2. Pt will score greater than or equal to 44% on FOTO in order to demonstrate improved perception of function due to symptoms.            Baseline: 34%  10/05/22 41% 12/30/22: 36%  Goal status: NOT MET      LONG TERM GOALS: Target date: 12/30/2022 (updated 11/18/22) Pt will score 56% or greater on FOTO in order to demonstrate improved perception of function due to symptoms.  Baseline: 34% 11/18/22: 40%  12/30/22: 36%  Goal status: NOT MET    2.  Pt will  demonstrate at least 10 degrees of L ankle DF AROM in order to facilitate improved tolerance to functional movements such as transfers/gait.  Baseline: see ROM chart above 11/18/22: see ROM chart above 12/30/22: 12 deg  Goal status: MET     3.  Pt will be able to perform floor transfer w/ less than 2pt increase in resting pain in order to promote increased tolerance to work activities.  Baseline: unable to perform  11/18/22: able to perform w/ UE support and increase in L thigh pain  12/30/22: Not assessed given pt attire - previously able to perform in clinic with mild increase in L thigh pain Goal status: PARTIALLY MET   4.  Pt will be able to perform sit to stand transfer without UE support and less than 2pt increase in resting pain to improve safety w/ transfers.  Baseline: B UE support and altered mechanics 11/18/22: no UE support, some hesitancy but no increase in pain   12/30/22: no UE support, no increase in pain  Goal status: MET      PLAN: DISCHARGE 12/30/22   PT FREQUENCY: NA   PT DURATION: NA   PLANNED INTERVENTIONS: NA   PLAN FOR NEXT SESSION: discharge to independent HEP, follow up with provider as needed   Ashley Murrain PT, DPT 12/30/2022 5:00 PM

## 2023-01-01 DIAGNOSIS — G90522 Complex regional pain syndrome I of left lower limb: Secondary | ICD-10-CM | POA: Diagnosis not present

## 2023-01-08 ENCOUNTER — Ambulatory Visit: Payer: 59 | Admitting: Physical Medicine & Rehabilitation

## 2023-01-08 NOTE — Assessment & Plan Note (Signed)
Change from sertraline to duloxetine.  -written instructions provided for her to start duloxetine and wean off sertraline.  Start duloxetine 30 mg everyday  Week 1 - sertraline - take 100 mg every other day. Alternate with 50 mg tablets  Week 2 - sertraline - take 50 mg for two days. On third day, take 100 mg - repeat this one time  Week 3 - take serttaline 50 mg daily  Week 4 - take sertraline 50 mg every other day. Alternate with no sertraline  Week 5 - take no sertraline for 2 days and on third day, take sertrlaine 50 mg. Repeat this once.  Week  6- take no sertraline.  Follow up with me and we can adjust dosing of duloxetine.

## 2023-01-08 NOTE — Assessment & Plan Note (Signed)
>>  ASSESSMENT AND PLAN FOR LEFT PERONEAL NERVE INJURY WRITTEN ON 01/08/2023  9:04 AM BY BOSCIA, HEATHER E, NP  Patient is seeing orthopedics and neurology  -trial duloxetine to help with pain as well and GAD.

## 2023-01-08 NOTE — Assessment & Plan Note (Signed)
Add singulair 10 mg daily.

## 2023-01-08 NOTE — Assessment & Plan Note (Signed)
Patient is seeing orthopedics and neurology  -trial duloxetine to help with pain as well and GAD.

## 2023-01-16 ENCOUNTER — Other Ambulatory Visit: Payer: Self-pay | Admitting: Nurse Practitioner

## 2023-01-16 DIAGNOSIS — F411 Generalized anxiety disorder: Secondary | ICD-10-CM

## 2023-01-16 DIAGNOSIS — S8412XA Injury of peroneal nerve at lower leg level, left leg, initial encounter: Secondary | ICD-10-CM

## 2023-01-19 NOTE — Telephone Encounter (Signed)
I'm not sure why they sent this. She is starting cymbalta and weaning off sertraline. She has refills for both. Can you find out what she is needing?

## 2023-01-20 NOTE — Telephone Encounter (Signed)
Called pt she stated that she no longer taking the Sertraline she had a reaction. She is only taking the Cymbalta

## 2023-01-21 NOTE — Addendum Note (Signed)
Addended by: Vincent Gros on: 01/21/2023 08:35 AM   Modules accepted: Level of Service

## 2023-01-22 ENCOUNTER — Encounter: Payer: Self-pay | Admitting: Physical Medicine & Rehabilitation

## 2023-01-22 ENCOUNTER — Encounter: Payer: 59 | Attending: Physical Medicine & Rehabilitation | Admitting: Physical Medicine & Rehabilitation

## 2023-01-22 VITALS — BP 131/84 | HR 84 | Ht 63.0 in | Wt 202.2 lb

## 2023-01-22 DIAGNOSIS — G90522 Complex regional pain syndrome I of left lower limb: Secondary | ICD-10-CM

## 2023-01-22 DIAGNOSIS — Z79891 Long term (current) use of opiate analgesic: Secondary | ICD-10-CM | POA: Diagnosis not present

## 2023-01-22 DIAGNOSIS — F411 Generalized anxiety disorder: Secondary | ICD-10-CM

## 2023-01-22 DIAGNOSIS — R252 Cramp and spasm: Secondary | ICD-10-CM | POA: Diagnosis not present

## 2023-01-22 DIAGNOSIS — S8412XA Injury of peroneal nerve at lower leg level, left leg, initial encounter: Secondary | ICD-10-CM | POA: Insufficient documentation

## 2023-01-22 NOTE — Progress Notes (Addendum)
Subjective:    Patient ID: Caitlin Bryant, female    DOB: 05/28/1981, 42 y.o.   MRN: [id number]  HPI  Caitlin Bryant is a 42 y.o. year old female  who  has a past medical history of Anxiety, Headache, and MRSA (methicillin resistant staph aureus) culture positive.   They are presenting to PM&R clinic as a new patient for pain management evaluation. They were referred for treatment of chronic pain. She reports her pain started after her ankle tendon surgery Aug 19, 2021.  She reports she had the surgery due to frequent ankle sprains.  She had a nerve block at the time behind her knee.  She completed physical therapy after her surgery.  Since this time has has had severe pain in her left lower extremity.  Patient reports her pain began August 19, 2021 when she had left ankle tendon surgery.  Since this time.  She has had worsening pain in her left lower extremity.  Her leg is very painful from her lower left thigh down to her foot.  She previously worked as a Runner, broadcasting/film/video.  She has been much less active and has had weight gain.  She is only able to walk very short distances.  Pain is progressively getting worse.  She has numbness and tingling in her distal toes.  Light touch is a very painful around her foot and ankle.  The sensation of showering is very painful.  Her pain is worse at night.  She has had a lot of swelling in her lower extremities since this time from her knee down to her foot.  She has been followed by neurology.  She was seen by Dr. August Saucer who was suspecting CRPS.  She initially had an EMG that was reported to have altered sensory function however EMG was repeated by Dr. Loleta Chance neurology and no significant abnormalities were noted.   Red flag symptoms: Patient denies saddle anesthesia, loss of bowel or bladder continence, new weakness, new numbness/tingling, or pain waking up at nighttime.   Medications tried: Gabapentin -unsure dose, only able to take HS due to sedation, liminal  benefit for pain Ibuprofen 200-400mg  2-3 times a day     Interval History 10/01/22 She reports continued pain in her left lower extremity that continues to worsen.  She has not noted significant benefit with Lyrica however she has been able to tolerate this better than gabapentin thus far.  She has started physical therapy.  She reports that desensitization techniques are tolerable initially however hours later she will have severe pain in this area.  Her pain continues to cover her entire left leg.  She sometimes feels a burning sensation in her toes.  She has a lot of paresthesias in her leg as well.  She will also be trying in aquatic therapy in addition to a land-based therapy.  Occasionally her leg will become red.  Her left leg has increased swelling compared to her right leg.  Interval History 11/24/22 Patient is here for follow-up of her left lower extremity pain.  She continues to work with physical therapy.  She also has some difficulties recently due to illness with her husband as well.  She feels like her pain is largely unchanged.  Possibly with some increased ability to push to the pain per therapy notes.  She is scheduled for some more pool therapy.  She is only taking Lyrica at night and this continues to cause her sedation.  While she does not feel like  the leg is insensate to touch, she feels that she cannot sense deep pressure as much on her left leg as she can on her right leg.  She is frustrated with her limitations.  Interval History 01/22/23 Patient is here for follow-up of her left lower extremity pain.  She reports she had 1 sympathetic block completed, provided benefit for about a day and pain was back to baseline within a week.  She has an additional injection scheduled next week.  She has not been using tramadol because she is cautious with this medication and does not want to develop an addiction.  She reports she used half a tablet of tramadol on one occasion.  She has not noticed  significant pain relief however she also did not have any significant side effects.  Her PCP has started her on Cymbalta 30 mg, she is not sure if this is providing significant benefit.  She reports having occasional muscle cramps in her right leg and foot.  She has noticed "was much slower on her left than her right foot.   Pain Inventory Average Pain 5 Pain Right Now 8 My pain is sharp, burning, dull, stabbing, tingling, aching, and tearing  In the last 24 hours, has pain interfered with the following? General activity 9 Relation with others 9 Enjoyment of life 9 What TIME of day is your pain at its worst? morning , daytime, evening, and night Sleep (in general) Poor  Pain is worse with: walking, bending, sitting, inactivity, standing, and some activites Pain improves with:  . Relief from Meds: 0  Family History  Problem Relation Age of Onset   Hypertension Mother    Diabetes Mother    Osteoarthritis Mother    Fibromyalgia Mother    Heart attack Father        Defibrillator   Social History   Socioeconomic History   Marital status: Married    Spouse name: Not on file   Number of children: Not on file   Years of education: Not on file   Highest education level: Not on file  Occupational History   Not on file  Tobacco Use   Smoking status: Never   Smokeless tobacco: Never  Vaping Use   Vaping Use: Never used  Substance and Sexual Activity   Alcohol use: Yes    Comment: 1 a month or less   Drug use: No   Sexual activity: Yes    Partners: Male  Other Topics Concern   Not on file  Social History Narrative   Are you right handed or left handed? Right   Are you currently employed ? no      Do you live at home alone?husband and 2 daughters   Caffeine none   What type of home do you live in: 1 story or 2 story? One with stairs inside       Social Determinants of Health   Financial Resource Strain: Not on file  Food Insecurity: Not on file  Transportation Needs:  Not on file  Physical Activity: Not on file  Stress: Not on file  Social Connections: Not on file   Past Surgical History:  Procedure Laterality Date   ANKLE ARTHROSCOPY WITH RECONSTRUCTION Left 08/19/2021   Procedure: LEFT ANKLE ARTHROSCOPY, ARTHROSCOPIC TREATMENT OF TALUS OSTEOCHONDRAL LESION, LATERAL LIGAMENT RECONSTRUCTION, PERONEAL TENDON DEBRIDEMENT, REPAIR OF DISLOCATING PERONEAL TENDONS;  Surgeon: Terance Hart, MD;  Location: [facility];  Service: Orthopedics;  Laterality: Left;  LENGTH OF SURGERY: 120  MINUTES   CESAREAN SECTION  2004 and 2008   x 2   DIAGNOSTIC LAPAROSCOPY     TONSILLECTOMY     Past Surgical History:  Procedure Laterality Date   ANKLE ARTHROSCOPY WITH RECONSTRUCTION Left 08/19/2021   Procedure: LEFT ANKLE ARTHROSCOPY, ARTHROSCOPIC TREATMENT OF TALUS OSTEOCHONDRAL LESION, LATERAL LIGAMENT RECONSTRUCTION, PERONEAL TENDON DEBRIDEMENT, REPAIR OF DISLOCATING PERONEAL TENDONS;  Surgeon: Terance Hart, MD;  Location: [facility];  Service: Orthopedics;  Laterality: Left;  LENGTH OF SURGERY: 120 MINUTES   CESAREAN SECTION  2004 and 2008   x 2   DIAGNOSTIC LAPAROSCOPY     TONSILLECTOMY     Past Medical History:  Diagnosis Date   Anxiety    Headache    MRSA (methicillin resistant staph aureus) culture positive    5 or more years ago, right thumb   BP 131/84   Pulse 84   Ht 5\' 3"  (1.6 m)   Wt 91.7 kg   SpO2 98%   BMI 35.82 kg/m   Opioid Risk Score:   Fall Risk Score:  `1  Depression screen [facility] 2/9     01/22/2023    2:36 PM 12/16/2022    9:39 AM 10/01/2022    3:52 PM 09/17/2022    9:17 AM 08/31/2022    3:09 PM 06/17/2022   10:17 AM 05/06/2022   10:37 AM  Depression screen [facility] 2/9  Decreased Interest 3 1 0 1 3 1 1   Down, Depressed, Hopeless 3 1 0 1 2 0 1  PHQ - 2 Score 6 2 0 2 5 1 2   Altered sleeping  2  2 3  0 0  Tired, decreased energy  1  1 2 1 1   Change in appetite  2  2 2 1 1   Feeling bad or failure about  yourself   2  0 0 0 1  Trouble concentrating  2  1 0 1 1  Moving slowly or fidgety/restless  1  0 0 0 1  Suicidal thoughts  0  0 0 0 0  PHQ-9 Score  12  8 12 4 7   Difficult doing work/chores  Very difficult   Very difficult       Review of Systems  Musculoskeletal:        Left leg pain  All other systems reviewed and are negative.     Objective:   Physical Exam   Gen: no distress, normal appearing HEENT: oral mucosa pink and moist, NCAT Chest: normal effort, normal rate of breathing Abd: soft, non-distended Psych: pleasant, normal affect Skin: intact Neuro: CN 2-12 grossly intact, follows commands,  Strength RLE 5/5 throughout Strength LLE at least 4/5 limited by pain Sensation intact light touch in all 4 extremities however she reports his altered throughout L leg Antalgic gait Musculoskeletal: Allodynia LLE, greatest in her lower leg below the knee Pain with movement of L ankle in all directions, pain with movement L knee  Slight swelling noted in LUE compared to RUE-unchanged Healed surgical incision on her left ankle      Assessment & Plan:   LLE pain after L ankle tendon surgery 2022 -Normal EMG LLE 08/10/22 -Agree with neurology and orthopedics, 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  She was ordered only 14 tabs about 2 months ago.  She plans to try this medication for vomiting or how it helps.  Muscle  cramps -Discussed trying magnesium supplement  Addendum- noted L spine MRI ordered by  Dr. Lorrine Kin neurosurgery and spine

## 2023-01-22 NOTE — Progress Notes (Unsigned)
Subjective:    Patient ID: Caitlin Bryant, female    DOB: Jan 10, 1981, 42 y.o.   MRN: [id number]  HPI  Pain Inventory Average Pain 8 Pain Right Now 6 My pain is sharp, burning, dull, stabbing, tingling, and aching  In the last 24 hours, has pain interfered with the following? General activity 6 Relation with others 6 Enjoyment of life 8 What TIME of day is your pain at its worst? morning , daytime, evening, and night Sleep (in general) Poor  Pain is worse with: walking, bending, sitting, inactivity, standing, and some activites Pain improves with:  na Relief from Meds: 1  Family History  Problem Relation Age of Onset  . Hypertension Mother   . Diabetes Mother   . Osteoarthritis Mother   . Fibromyalgia Mother   . Heart attack Father        Defibrillator   Social History   Socioeconomic History  . Marital status: Married    Spouse name: Not on file  . Number of children: Not on file  . Years of education: Not on file  . Highest education level: Not on file  Occupational History  . Not on file  Tobacco Use  . Smoking status: Never  . Smokeless tobacco: Never  Vaping Use  . Vaping Use: Never used  Substance and Sexual Activity  . Alcohol use: Yes    Comment: 1 a month or less  . Drug use: No  . Sexual activity: Yes    Partners: Male  Other Topics Concern  . Not on file  Social History Narrative   Are you right handed or left handed? Right   Are you currently employed ? no      Do you live at home alone?husband and 2 daughters   Caffeine none   What type of home do you live in: 1 story or 2 story? One with stairs inside       Social Determinants of Health   Financial Resource Strain: Not on file  Food Insecurity: Not on file  Transportation Needs: Not on file  Physical Activity: Not on file  Stress: Not on file  Social Connections: Not on file   Past Surgical History:  Procedure Laterality Date  . ANKLE ARTHROSCOPY WITH RECONSTRUCTION Left  08/19/2021   Procedure: LEFT ANKLE ARTHROSCOPY, ARTHROSCOPIC TREATMENT OF TALUS OSTEOCHONDRAL LESION, LATERAL LIGAMENT RECONSTRUCTION, PERONEAL TENDON DEBRIDEMENT, REPAIR OF DISLOCATING PERONEAL TENDONS;  Surgeon: Terance Hart, MD;  Location: [facility];  Service: Orthopedics;  Laterality: Left;  LENGTH OF SURGERY: 120 MINUTES  . CESAREAN SECTION  2004 and 2008   x 2  . DIAGNOSTIC LAPAROSCOPY    . TONSILLECTOMY     Past Surgical History:  Procedure Laterality Date  . ANKLE ARTHROSCOPY WITH RECONSTRUCTION Left 08/19/2021   Procedure: LEFT ANKLE ARTHROSCOPY, ARTHROSCOPIC TREATMENT OF TALUS OSTEOCHONDRAL LESION, LATERAL LIGAMENT RECONSTRUCTION, PERONEAL TENDON DEBRIDEMENT, REPAIR OF DISLOCATING PERONEAL TENDONS;  Surgeon: Terance Hart, MD;  Location: [facility];  Service: Orthopedics;  Laterality: Left;  LENGTH OF SURGERY: 120 MINUTES  . CESAREAN SECTION  2004 and 2008   x 2  . DIAGNOSTIC LAPAROSCOPY    . TONSILLECTOMY     Past Medical History:  Diagnosis Date  . Anxiety   . Headache   . MRSA (methicillin resistant staph aureus) culture positive    5 or more years ago, right thumb   BP 131/84   Pulse 84   Ht 5\' 3"  (1.6 m)  Wt 202 lb 3.2 oz (91.7 kg)   SpO2 98%   BMI 35.82 kg/m   Opioid Risk Score:   Fall Risk Score:  `1  Depression screen [facility] 2/9     01/22/2023    2:36 PM 12/16/2022    9:39 AM 10/01/2022    3:52 PM 09/17/2022    9:17 AM 08/31/2022    3:09 PM 06/17/2022   10:17 AM 05/06/2022   10:37 AM  Depression screen [facility] 2/9  Decreased Interest 3 1 0 1 3 1 1   Down, Depressed, Hopeless 3 1 0 1 2 0 1  PHQ - 2 Score 6 2 0 2 5 1 2   Altered sleeping  2  2 3  0 0  Tired, decreased energy  1  1 2 1 1   Change in appetite  2  2 2 1 1   Feeling bad or failure about yourself   2  0 0 0 1  Trouble concentrating  2  1 0 1 1  Moving slowly or fidgety/restless  1  0 0 0 1  Suicidal thoughts  0  0 0 0 0  PHQ-9 Score  12  8 12 4 7    Difficult doing work/chores  Very difficult   Very difficult       Review of Systems  Constitutional: Negative.   HENT: Negative.    Eyes: Negative.   Respiratory: Negative.    Cardiovascular: Negative.   Gastrointestinal: Negative.   Endocrine: Negative.   Genitourinary: Negative.   Musculoskeletal:  Positive for back pain and gait problem.  Skin: Negative.   Allergic/Immunologic: Negative.   Hematological: Negative.   Psychiatric/Behavioral: Negative.    All other systems reviewed and are negative.      Objective:   Physical Exam        Assessment & Plan:

## 2023-01-23 MED ORDER — DULOXETINE HCL 60 MG PO CPEP: 60.0000 mg | ORAL_CAPSULE | Freq: Every day | ORAL | 3 refills | Status: DC

## 2023-01-26 DIAGNOSIS — G90522 Complex regional pain syndrome I of left lower limb: Secondary | ICD-10-CM | POA: Diagnosis not present

## 2023-01-27 ENCOUNTER — Ambulatory Visit: Payer: 59 | Admitting: Nurse Practitioner

## 2023-01-28 ENCOUNTER — Encounter: Payer: Self-pay | Admitting: Physical Medicine & Rehabilitation

## 2023-02-03 ENCOUNTER — Ambulatory Visit (INDEPENDENT_AMBULATORY_CARE_PROVIDER_SITE_OTHER): Payer: 59 | Admitting: Nurse Practitioner

## 2023-02-03 ENCOUNTER — Encounter: Payer: Self-pay | Admitting: Nurse Practitioner

## 2023-02-03 VITALS — BP 125/86 | HR 92 | Ht 63.0 in | Wt 202.4 lb

## 2023-02-03 DIAGNOSIS — G43909 Migraine, unspecified, not intractable, without status migrainosus: Secondary | ICD-10-CM

## 2023-02-03 DIAGNOSIS — B372 Candidiasis of skin and nail: Secondary | ICD-10-CM | POA: Diagnosis not present

## 2023-02-03 DIAGNOSIS — R110 Nausea: Secondary | ICD-10-CM | POA: Insufficient documentation

## 2023-02-03 DIAGNOSIS — Z23 Encounter for immunization: Secondary | ICD-10-CM | POA: Diagnosis not present

## 2023-02-03 DIAGNOSIS — J3089 Other allergic rhinitis: Secondary | ICD-10-CM | POA: Diagnosis not present

## 2023-02-03 DIAGNOSIS — F411 Generalized anxiety disorder: Secondary | ICD-10-CM

## 2023-02-03 MED ORDER — RIZATRIPTAN BENZOATE 10 MG PO TABS
10.0000 mg | ORAL_TABLET | ORAL | 3 refills | Status: DC | PRN
Start: 2023-02-03 — End: 2024-01-18

## 2023-02-03 MED ORDER — ONDANSETRON 4 MG PO TBDP
4.0000 mg | ORAL_TABLET | Freq: Three times a day (TID) | ORAL | 2 refills | Status: AC | PRN
Start: 2023-02-03 — End: ?

## 2023-02-03 MED ORDER — CETIRIZINE HCL 10 MG PO TABS
10.0000 mg | ORAL_TABLET | Freq: Every day | ORAL | 1 refills | Status: DC
Start: 2023-02-03 — End: 2023-07-19

## 2023-02-03 MED ORDER — CLOTRIMAZOLE-BETAMETHASONE 1-0.05 % EX CREA
1.0000 | TOPICAL_CREAM | Freq: Two times a day (BID) | CUTANEOUS | 1 refills | Status: DC
Start: 2023-02-03 — End: 2023-05-05

## 2023-02-03 NOTE — Assessment & Plan Note (Signed)
Cymbalta increased to 60 mg per pain management provider  -tolerated well.  -reassess in 4 months

## 2023-02-03 NOTE — Assessment & Plan Note (Signed)
Improved with addition of singulair daily  Conitue zyrtec daily Use nasal sprays as prescribed

## 2023-02-03 NOTE — Assessment & Plan Note (Signed)
Generally well managed.  -continue to take maxalt as needed for acute migraine. -add ODT zofran as needed for nausea associated with migraine headaches

## 2023-02-03 NOTE — Assessment & Plan Note (Signed)
Associated with migraine headaches  -continue to take maxalt as needed for acute migraine. -add ODT zofran as needed for nausea associated with migraine headaches

## 2023-02-03 NOTE — Assessment & Plan Note (Signed)
Trial lotrisone cream  -apply to effected areas twice daily for next 10 to 14 days.  -may continue to use powder to areas to keep dry

## 2023-02-03 NOTE — Progress Notes (Signed)
Established patient visit   Patient: Caitlin Bryant   DOB: 12-Jul-1981   42 y.o. Female  MRN: [id number] Visit Date: 02/03/2023   Chief Complaint  Patient presents with   Medical Management of Chronic Issues   Subjective    HPI  Follow up  -GAD/depression  -chronic pain in left ankle.  --started duloxetine 30 mg daily at most recent visit.  --gradually weaned off sertraline.  --has seen pain management provider increased the dose to 60 mg.  --she states that she is not sure yet, about the effectiveness.  -continues to see orthopedics and neurosurgery for pain and nerve disorder in left ankle.  -chronic allergic rhinitis. --started singulair daily --added astelin nasal spray  Rash  Under the left breast and spreading down the left flank.  -has been using OTC clotrimazole and applying arm and hammer powder to the area to keep it dry -feels like she is keeping it controlled, but it is still bothering her.   Medications: Outpatient Medications Prior to Visit  Medication Sig   azelastine (ASTELIN) 0.1 % nasal spray Place 2 sprays into both nostrils 2 (two) times daily. Use in each nostril as directed   DULoxetine (CYMBALTA) 60 MG capsule Take 1 capsule (60 mg total) by mouth daily.   fluticasone (FLONASE) 50 MCG/ACT nasal spray Place into both nostrils daily.   [name] 1/20 1-20 MG-MCG tablet Take 1 tablet by mouth daily.   montelukast (SINGULAIR) 10 MG tablet Take 1 tablet (10 mg total) by mouth at bedtime.   traMADol (ULTRAM) 50 MG tablet Take 1 tablet (50 mg total) by mouth every 12 (twelve) hours as needed.   [DISCONTINUED] cetirizine (ZYRTEC) 10 MG tablet Take 10 mg by mouth daily.   [DISCONTINUED] ondansetron (ZOFRAN ODT) 4 MG disintegrating tablet Take 1 tablet (4 mg total) by mouth every 8 (eight) hours as needed for nausea or vomiting.   [DISCONTINUED] rizatriptan (MAXALT) 10 MG tablet Take 1 tablet (10 mg total) by mouth as needed for migraine. May repeat  in 2 hours if needed   [DISCONTINUED] naproxen (NAPROSYN) 250 MG tablet Take 1 tablet (250 mg total) by mouth 2 (two) times daily with a meal.   No facility-administered medications prior to visit.    Review of Systems See HPI    Last CBC Lab Results  Component Value Date   WBC 6.5 03/23/2022   HGB 13.6 03/23/2022   HCT 41.2 03/23/2022   MCV 86 03/23/2022   MCH 28.4 03/23/2022   RDW 12.7 03/23/2022   PLT 224 03/23/2022   Last metabolic panel Lab Results  Component Value Date   GLUCOSE 96 03/23/2022   NA 140 03/23/2022   K 4.1 03/23/2022   CL 105 03/23/2022   CO2 21 03/23/2022   BUN 10 03/23/2022   CREATININE 0.70 03/23/2022   EGFR 111 03/23/2022   CALCIUM 9.1 03/23/2022   PROT 7.3 03/23/2022   ALBUMIN 4.4 03/23/2022   LABGLOB 2.9 03/23/2022   AGRATIO 1.5 03/23/2022   BILITOT 0.4 03/23/2022   ALKPHOS 79 03/23/2022   AST 13 03/23/2022   ALT 6 03/23/2022   Last lipids Lab Results  Component Value Date   CHOL 161 03/23/2022   HDL 49 03/23/2022   LDLCALC 91 03/23/2022   TRIG 117 03/23/2022   CHOLHDL 3.3 03/23/2022   Last hemoglobin A1c Lab Results  Component Value Date   HGBA1C 5.3 03/23/2022   Last thyroid functions Lab Results  Component Value Date   TSH 1.120  03/23/2022   Last vitamin D Lab Results  Component Value Date   VD25OH 42.9 03/13/2021       Objective     Today's Vitals   02/03/23 0914  BP: 125/86  Pulse: 92  SpO2: 97%  Weight: 202 lb 6.4 oz (91.8 kg)  Height: 5\' 3"  (1.6 m)   Body mass index is 35.85 kg/m.  BP Readings from Last 3 Encounters:  02/03/23 125/86  01/22/23 131/84  12/16/22 129/83    Wt Readings from Last 3 Encounters:  02/03/23 202 lb 6.4 oz (91.8 kg)  01/22/23 202 lb 3.2 oz (91.7 kg)  12/16/22 201 lb 6.4 oz (91.4 kg)    Physical Exam Vitals and nursing note reviewed.  Constitutional:      Appearance: Normal appearance. She is well-developed.  HENT:     Head: Normocephalic and atraumatic.     Nose:  Nose normal.     Mouth/Throat:     Mouth: Mucous membranes are moist.     Pharynx: Oropharynx is clear.  Eyes:     Extraocular Movements: Extraocular movements intact.     Conjunctiva/sclera: Conjunctivae normal.     Pupils: Pupils are equal, round, and reactive to light.  Neck:     Vascular: No carotid bruit.  Cardiovascular:     Rate and Rhythm: Normal rate and regular rhythm.     Pulses: Normal pulses.     Heart sounds: Normal heart sounds.  Pulmonary:     Effort: Pulmonary effort is normal.     Breath sounds: Normal breath sounds.  Abdominal:     Palpations: Abdomen is soft.  Musculoskeletal:        General: Normal range of motion.     Cervical back: Normal range of motion and neck supple.  Lymphadenopathy:     Cervical: No cervical adenopathy.  Skin:    General: Skin is warm and dry.     Capillary Refill: Capillary refill takes less than 2 seconds.     Findings: Rash present.     Comments: Red, inflamed rash under left breast. Are of concern stretches down the left side of the thorax. Skin intact and warm to touch. Similar rash under right breast, though much less severe.   Neurological:     General: No focal deficit present.     Mental Status: She is alert and oriented to person, place, and time.  Psychiatric:        Mood and Affect: Mood normal.        Behavior: Behavior normal.        Thought Content: Thought content normal.        Judgment: Judgment normal.      Assessment & Plan    Acute confusional migraine Assessment & Plan: Generally well managed.  -continue to take maxalt as needed for acute migraine. -add ODT zofran as needed for nausea associated with migraine headaches    Orders: -     Rizatriptan Benzoate; Take 1 tablet (10 mg total) by mouth as needed for migraine. May repeat in 2 hours if needed  Dispense: 10 tablet; Refill: 3  Nausea Assessment & Plan: Associated with migraine headaches  -continue to take maxalt as needed for acute  migraine. -add ODT zofran as needed for nausea associated with migraine headaches    Orders: -     Ondansetron; Take 1-2 tablets (4-8 mg total) by mouth every 8 (eight) hours as needed for nausea or vomiting.  Dispense: 30 tablet; Refill: 2  Non-seasonal allergic rhinitis due to other allergic trigger Assessment & Plan: Improved with addition of singulair daily  Conitue zyrtec daily Use nasal sprays as prescribed   Orders: -     Cetirizine HCl; Take 1 tablet (10 mg total) by mouth daily.  Dispense: 90 tablet; Refill: 1  Cutaneous candidiasis Assessment & Plan: Trial lotrisone cream  -apply to effected areas twice daily for next 10 to 14 days.  -may continue to use powder to areas to keep dry   Orders: -     Clotrimazole-Betamethasone; Apply 1 Application topically 2 (two) times daily.  Dispense: 45 g; Refill: 1  Generalized anxiety disorder Assessment & Plan: Cymbalta increased to 60 mg per pain management provider  -tolerated well.  -reassess in 4 months    Need for Tdap vaccination -     Tdap vaccine greater than or equal to 7yo IM     Return in about 3 months (around 05/06/2023) for health maintenance exam, FBW a week prior to visit.         Carlean Jews, NP  [facility] at [facility] [phone] (phone) [phone] (fax)  [facility]

## 2023-02-08 ENCOUNTER — Other Ambulatory Visit: Payer: Self-pay | Admitting: Nurse Practitioner

## 2023-02-08 DIAGNOSIS — J3089 Other allergic rhinitis: Secondary | ICD-10-CM

## 2023-02-18 DIAGNOSIS — G90522 Complex regional pain syndrome I of left lower limb: Secondary | ICD-10-CM | POA: Diagnosis not present

## 2023-02-18 DIAGNOSIS — Z6835 Body mass index (BMI) 35.0-35.9, adult: Secondary | ICD-10-CM | POA: Diagnosis not present

## 2023-03-10 DIAGNOSIS — M5416 Radiculopathy, lumbar region: Secondary | ICD-10-CM | POA: Diagnosis not present

## 2023-03-10 DIAGNOSIS — Z6835 Body mass index (BMI) 35.0-35.9, adult: Secondary | ICD-10-CM | POA: Diagnosis not present

## 2023-03-10 DIAGNOSIS — G90522 Complex regional pain syndrome I of left lower limb: Secondary | ICD-10-CM | POA: Diagnosis not present

## 2023-03-12 ENCOUNTER — Encounter: Payer: Self-pay | Admitting: Emergency Medicine

## 2023-03-12 ENCOUNTER — Ambulatory Visit
Admission: EM | Admit: 2023-03-12 | Discharge: 2023-03-12 | Disposition: A | Discharge status: Home / Self Care | Payer: 59 | Attending: Family Medicine | Admitting: Family Medicine

## 2023-03-12 ENCOUNTER — Ambulatory Visit (INDEPENDENT_AMBULATORY_CARE_PROVIDER_SITE_OTHER): Payer: 59

## 2023-03-12 DIAGNOSIS — R079 Chest pain, unspecified: Secondary | ICD-10-CM

## 2023-03-12 DIAGNOSIS — S2242XA Multiple fractures of ribs, left side, initial encounter for closed fracture: Secondary | ICD-10-CM | POA: Diagnosis not present

## 2023-03-12 MED ORDER — KETOROLAC TROMETHAMINE 10 MG PO TABS: 10.0000 mg | ORAL_TABLET | Freq: Four times a day (QID) | ORAL | 0 refills | Status: AC | PRN

## 2023-03-12 MED ORDER — TIZANIDINE HCL 4 MG PO TABS: 4.0000 mg | ORAL_TABLET | Freq: Three times a day (TID) | ORAL | 0 refills | Status: AC | PRN

## 2023-03-12 MED ORDER — KETOROLAC TROMETHAMINE 30 MG/ML IJ SOLN
30.0000 mg | Freq: Once | INTRAMUSCULAR | Status: AC
  Administered 2023-03-12: 30 mg via INTRAMUSCULAR

## 2023-03-12 NOTE — ED Triage Notes (Signed)
Patient c/o left sided back pain x 1 week after lifting a refrigerator.  Since then when patient tries to have a BM, she is having intense pain.

## 2023-03-12 NOTE — Discharge Instructions (Signed)
Your xrays were read as normal.  You have been given a shot of Toradol 30 mg today.  Ketorolac 10 mg tablets--take 1 tablet every 6 hours as needed for pain.  This is the same medicine that is in the shot we just gave you   Take tizanidine 4 mg--1 every 8 hours as needed for muscle spasms; this medication can cause dizziness and sleepiness  Please proceed to the ER if worsening.  Followup with primary care and your pain management providers.

## 2023-03-12 NOTE — ED Provider Notes (Signed)
[facility]    CSN: [id number] Arrival date & time: 03/12/23  4098      History   Chief Complaint Chief Complaint  Patient presents with   Back Pain    HPI Caitlin Bryant is a 42 y.o. female.    Back Pain Here for pain in left side ribs. It began 1 week ago when she lifted a refridgerator. For the next day, she had some cough, and maybe was hard to breathe (she states like panic?). No more coughing, and no f/c. No more trouble breathing. Lying on that side makes it worse, and it is excruciating when she has a BM and valsalvas. Not constipated.   She also had some vomiting with some vertigo about 4-5 days ago. Now better.  No hematuria and no dysuria. LMP about 7 days ago.  She is undergoing chronic pain management for chronic left leg pain. Sometimes the current left side/rib pain has radiated into left leg, mainly with the valsalva.  She is allergic to sulfa and sumatriptan.   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   Cutaneous candidiasis 05/24/2022   BMI 34.0-34.9,adult 09/28/2021   Left foot pain 09/28/2021   Overweight with body mass index (BMI) 25.0-29.9 06/25/2021   Acute right-sided low back pain with right-sided sciatica 06/15/2021   Hip pain 06/15/2021   Disorder of lumbosacral intervertebral disc 06/15/2021   Chronic pain of left ankle 06/08/2021   Routine general medical examination at a health care facility 03/17/2021   Encounter to establish care 02/23/2021   Other fatigue 02/23/2021   Viral upper respiratory infection 12/04/2020   Acute bilateral low back pain without sciatica 06/09/2019   Sore throat 06/09/2018   Left knee pain  04/20/2018   Impacted cerumen of right ear 04/15/2017   Acute non-recurrent pansinusitis 10/09/2016   Neck swelling 08/07/2016   Mouth sores 07/24/2015   Depression 07/24/2015   TMJ dysfunction 07/03/2015   Acute confusional migraine 05/10/2015   Ankle instability, left 02/22/2015   Generalized anxiety disorder 01/31/2015   Allergic rhinitis 01/31/2015    Past Surgical History:  Procedure Laterality Date   ANKLE ARTHROSCOPY WITH RECONSTRUCTION Left 08/19/2021   Procedure: LEFT ANKLE ARTHROSCOPY, ARTHROSCOPIC TREATMENT OF TALUS OSTEOCHONDRAL LESION, LATERAL LIGAMENT RECONSTRUCTION, PERONEAL TENDON DEBRIDEMENT, REPAIR OF DISLOCATING PERONEAL TENDONS;  Surgeon: Terance Hart, MD;  Location: [facility];  Service: Orthopedics;  Laterality: Left;  LENGTH OF SURGERY: 120 MINUTES   CESAREAN SECTION  2004 and 2008   x 2   DIAGNOSTIC LAPAROSCOPY     TONSILLECTOMY      OB History   No obstetric history on file.      Home Medications    Prior to Admission medications   Medication Sig Start Date End Date Taking? Authorizing Provider  azelastine (ASTELIN) 0.1 % nasal spray USE 2 SPRAYS IN EACH NOSTRIL TWICE DAILY 02/08/23  Yes Boscia, Heather E, NP  cetirizine (ZYRTEC) 10 MG tablet Take 1 tablet (10 mg total) by mouth daily. 02/03/23  Yes Boscia, Kathlynn Grate, NP  clotrimazole-betamethasone (LOTRISONE) cream Apply 1 Application topically 2 (two) times daily. 02/03/23  Yes  Carlean Jews, NP  DULoxetine (CYMBALTA) 60 MG capsule Take 1 capsule (60 mg total) by mouth daily. 01/23/23  Yes Fanny Dance, MD  fluticasone (FLONASE) 50 MCG/ACT nasal spray Place into both nostrils daily.   Yes [provider]  [name] 1/20 1-20 MG-MCG tablet Take 1 tablet by mouth daily. 09/08/22  Yes [provider]  ketorolac (TORADOL) 10 MG tablet Take 1 tablet (10 mg total) by mouth every 6 (six) hours as needed (pain). 03/12/23  Yes Zenia Resides, MD  montelukast  (SINGULAIR) 10 MG tablet Take 1 tablet (10 mg total) by mouth at bedtime. 12/16/22  Yes Boscia, Kathlynn Grate, NP  ondansetron (ZOFRAN ODT) 4 MG disintegrating tablet Take 1-2 tablets (4-8 mg total) by mouth every 8 (eight) hours as needed for nausea or vomiting. 02/03/23  Yes Boscia, Heather E, NP  rizatriptan (MAXALT) 10 MG tablet Take 1 tablet (10 mg total) by mouth as needed for migraine. May repeat in 2 hours if needed 02/03/23  Yes Boscia, Kathlynn Grate, NP  tiZANidine (ZANAFLEX) 4 MG tablet Take 1 tablet (4 mg total) by mouth every 8 (eight) hours as needed for muscle spasms. 03/12/23  Yes Joquan Lotz, Janace Aris, MD  traMADol (ULTRAM) 50 MG tablet Take 1 tablet (50 mg total) by mouth every 12 (twelve) hours as needed. 11/24/22  Yes Fanny Dance, MD    Family History Family History  Problem Relation Age of Onset   Hypertension Mother    Diabetes Mother    Osteoarthritis Mother    Fibromyalgia Mother    Heart attack Father        Defibrillator    Social History Social History   Tobacco Use   Smoking status: Never   Smokeless tobacco: Never  Vaping Use   Vaping status: Never Used  Substance Use Topics   Alcohol use: Yes    Comment: 1 a month or less   Drug use: No     Allergies   Sumatriptan and Sulfa antibiotics   Review of Systems Review of Systems  Musculoskeletal:  Positive for back pain.     Physical Exam Triage Vital Signs ED Triage Vitals  Encounter Vitals Group     BP 03/12/23 0901 (!) 137/92     Systolic BP Percentile --      Diastolic BP Percentile --      Pulse Rate 03/12/23 0901 (!) 105     Resp 03/12/23 0901 16     Temp 03/12/23 0901 98 F (36.7 C)     Temp Source 03/12/23 0901 Oral     SpO2 03/12/23 0901 96 %     Weight --      Height --      Head Circumference --      Peak Flow --      Pain Score 03/12/23 0902 3     Pain Loc --      Pain Education --      Exclude from Growth Chart --    No data found.  Updated Vital Signs BP (!) 137/92 (BP  Location: Left Arm)   Pulse (!) 105   Temp 98 F (36.7 C) (Oral)   Resp 16   SpO2 96%   Visual Acuity Right Eye Distance:   Left Eye Distance:   Bilateral Distance:    Right Eye Near:   Left Eye Near:    Bilateral Near:     Physical Exam Vitals reviewed.  Constitutional:  General: She is not in acute distress.    Appearance: She is not ill-appearing, toxic-appearing or diaphoretic.  HENT:     Mouth/Throat:     Mouth: Mucous membranes are moist.  Eyes:     Extraocular Movements: Extraocular movements intact.     Conjunctiva/sclera: Conjunctivae normal.     Pupils: Pupils are equal, round, and reactive to light.  Cardiovascular:     Rate and Rhythm: Normal rate and regular rhythm.     Heart sounds: No murmur heard. Pulmonary:     Effort: Pulmonary effort is normal. No respiratory distress.     Breath sounds: Normal breath sounds. No stridor. No wheezing, rhonchi or rales.  Chest:     Chest wall: Tenderness (mid axillary linw on lwdr) present.  Musculoskeletal:     Cervical back: Neck supple.  Lymphadenopathy:     Cervical: No cervical adenopathy.  Skin:    Coloration: Skin is not jaundiced or pale.  Neurological:     General: No focal deficit present.     Mental Status: She is alert and oriented to person, place, and time.  Psychiatric:        Behavior: Behavior normal.      UC Treatments / Results  Labs (all labs ordered are listed, but only abnormal results are displayed) Labs Reviewed - No data to display  EKG   Radiology DG Ribs Unilateral W/Chest Left  Result Date: 03/12/2023 CLINICAL DATA:  left rib pain after heavy lifting one week ago. pain continues to be severe. EXAM: LEFT RIBS AND CHEST - 3+ VIEW COMPARISON:  None Available. FINDINGS: The heart and mediastinal contours are within normal limits. No focal consolidation. No pulmonary edema. No pleural effusion. No pneumothorax. BB marker overlies left lower chest. No acute displaced fracture or  other bone lesions are seen involving the left ribs. IMPRESSION: No acute displaced left rib fracture. Please note, nondisplaced rib fractures may be occult on radiograph. Electronically Signed   By: Tish Frederickson M.D.   On: 03/12/2023 09:45    Procedures Procedures (including critical care time)  Medications Ordered in UC Medications  ketorolac (TORADOL) 30 MG/ML injection 30 mg (has no administration in time range)    Initial Impression / Assessment and Plan / UC Course  I have reviewed the triage vital signs and the nursing notes.  Pertinent labs & imaging results that were available during my care of the patient were reviewed by me and considered in my medical decision making (see chart for details).        Xrays read negative; ?able area on rib 7. Needs pain relief at any rate: Toradol injection given here, and toradol tabs sent to pharmacy. Tizanidine sent in in case muscle relaxer will help. I have asked her to make a f/u appt with primary care about this, and she has physical medicine appt coming up soon. To the ER if worsening. Final Clinical Impressions(s) / UC Diagnoses   Final diagnoses:  Left-sided chest pain     Discharge Instructions      Your xrays were read as normal.  You have been given a shot of Toradol 30 mg today.  Ketorolac 10 mg tablets--take 1 tablet every 6 hours as needed for pain.  This is the same medicine that is in the shot we just gave you   Take tizanidine 4 mg--1 every 8 hours as needed for muscle spasms; this medication can cause dizziness and sleepiness  Please proceed to the ER if worsening.  Followup with primary care and your pain management providers.     ED Prescriptions     Medication Sig Dispense Auth. Provider   ketorolac (TORADOL) 10 MG tablet Take 1 tablet (10 mg total) by mouth every 6 (six) hours as needed (pain). 20 tablet Amanee Iacovelli, Janace Aris, MD   tiZANidine (ZANAFLEX) 4 MG tablet Take 1 tablet (4 mg total) by  mouth every 8 (eight) hours as needed for muscle spasms. 15 tablet Sharleen Szczesny, Janace Aris, MD      I have reviewed the PDMP during this encounter.   Zenia Resides, MD 03/12/23 [phone]

## 2023-03-16 ENCOUNTER — Other Ambulatory Visit: Payer: Self-pay | Admitting: Pain Medicine

## 2023-03-16 DIAGNOSIS — M5416 Radiculopathy, lumbar region: Secondary | ICD-10-CM

## 2023-03-24 ENCOUNTER — Other Ambulatory Visit: Payer: Self-pay | Admitting: Family Medicine

## 2023-03-24 ENCOUNTER — Other Ambulatory Visit: Payer: Self-pay

## 2023-03-24 ENCOUNTER — Telehealth: Payer: Self-pay | Admitting: *Deleted

## 2023-03-24 MED ORDER — NIRMATRELVIR/RITONAVIR (PAXLOVID)TABLET: 3.0000 | ORAL_TABLET | Freq: Two times a day (BID) | ORAL | 0 refills | Status: DC

## 2023-03-24 MED ORDER — NIRMATRELVIR/RITONAVIR (PAXLOVID)TABLET: 3.0000 | ORAL_TABLET | Freq: Two times a day (BID) | ORAL | 0 refills | Status: AC

## 2023-03-24 NOTE — Telephone Encounter (Signed)
Symptoms: chills, body aches, runny nose, dizzy pt describes it like she has a bad case of the flu she states that she thinks she was Paxlovid. She just wants to feel better.

## 2023-03-24 NOTE — Telephone Encounter (Signed)
Pt calling stating that she tested positive today with Covid and that her symptoms started yesterday.  She is wanting to see if some medication can be sent in to the [facility]. Please advise.

## 2023-03-24 NOTE — Telephone Encounter (Signed)
Can you asked the patient if she is wanting something like Paxlovid where she is talking about medications to treat her symptoms.  I can send in either.

## 2023-03-24 NOTE — Telephone Encounter (Signed)
Prescription for paxlovid was sent in.  Pt should take it for five days.  She can also take tylenol and ibuprofen as needed for fever/pain.

## 2023-03-24 NOTE — Telephone Encounter (Signed)
Rx was resent to [facility] on [address]

## 2023-03-24 NOTE — Telephone Encounter (Signed)
Please change Rx to [facility] on [address]

## 2023-03-25 ENCOUNTER — Encounter: Payer: 59 | Admitting: Physical Medicine & Rehabilitation

## 2023-04-07 ENCOUNTER — Other Ambulatory Visit: Payer: Self-pay | Admitting: Nurse Practitioner

## 2023-04-07 DIAGNOSIS — J3089 Other allergic rhinitis: Secondary | ICD-10-CM

## 2023-04-12 ENCOUNTER — Encounter: Payer: Self-pay | Admitting: *Deleted

## 2023-04-12 ENCOUNTER — Ambulatory Visit
Admission: EM | Admit: 2023-04-12 | Discharge: 2023-04-12 | Disposition: A | Discharge status: Home / Self Care | Payer: 59 | Attending: Internal Medicine | Admitting: Internal Medicine

## 2023-04-12 ENCOUNTER — Other Ambulatory Visit: Payer: Self-pay

## 2023-04-12 DIAGNOSIS — R197 Diarrhea, unspecified: Secondary | ICD-10-CM | POA: Diagnosis not present

## 2023-04-12 DIAGNOSIS — Z3202 Encounter for pregnancy test, result negative: Secondary | ICD-10-CM | POA: Diagnosis not present

## 2023-04-12 DIAGNOSIS — R109 Unspecified abdominal pain: Secondary | ICD-10-CM | POA: Diagnosis not present

## 2023-04-12 DIAGNOSIS — R112 Nausea with vomiting, unspecified: Secondary | ICD-10-CM | POA: Insufficient documentation

## 2023-04-12 LAB — POCT URINALYSIS DIP (MANUAL ENTRY)
Glucose, UA: NEGATIVE mg/dL
Leukocytes, UA: NEGATIVE
Nitrite, UA: NEGATIVE
Protein Ur, POC: 100 mg/dL — AB
Spec Grav, UA: >=1.03 — AB (ref 1.010–1.025)
Urobilinogen, UA: 0.2 E.U./dL
pH, UA: 5.5 (ref 5.0–8.0)

## 2023-04-12 LAB — POCT URINE PREGNANCY: Preg Test, Ur: NEGATIVE

## 2023-04-12 NOTE — ED Triage Notes (Addendum)
Pt reports not feeling good. Loose BM, Nausea- vomited x 2 Saturday. C/o pain left flank pain. States she had bleeding today when she wiped

## 2023-04-12 NOTE — ED Provider Notes (Signed)
[facility]    CSN: [id number] Arrival date & time: 04/12/23  1635      History   Chief Complaint No chief complaint on file.   HPI Caitlin Bryant is a 42 y.o. female.   Patient presents today with nausea, vomiting, diarrhea.  Reports that symptoms started at the end of last week so a few days prior.  Diarrhea has seemed to now resolve but patient is not sure if she has had a normal bowel movement yet.  Reports that vomiting has resolved but she is left with nausea.  Denies blood in stool or emesis.  Denies any obvious abdominal pain, fever, body aches, chills.  Denies any obvious known sick contacts, recent travel outside the [place], recent unfavorable foods.  Patient also reporting some left-sided flank/rib pain that started around the same time but was also present about a month or so ago and she was evaluated for it.  Reports that she was told that it is muscular in nature at that time.  Reports that that pain does feel similar to pain from previous evaluation.  Denies any upper respiratory symptoms or coughing.  Denies any recent injury to the area.  Denies urinary frequency, urinary burning, vaginal discharge.  She does report that she has noticed some blood when wiping but thinks it is coming from her urethra and not her vaginal area.  Denies any vaginal discharge.  Denies exposure to STD.  Last menstrual cycle completed about 2 weeks ago.  Patient has been able to tolerate food and fluids.     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   Cutaneous candidiasis 05/24/2022   BMI 34.0-34.9,adult 09/28/2021   Left foot pain 09/28/2021   Overweight with body  mass index (BMI) 25.0-29.9 06/25/2021   Acute right-sided low back pain with right-sided sciatica 06/15/2021   Hip pain 06/15/2021   Disorder of lumbosacral intervertebral disc 06/15/2021   Chronic pain of left ankle 06/08/2021   Routine general medical examination at a health care facility 03/17/2021   Encounter to establish care 02/23/2021   Other fatigue 02/23/2021   Viral upper respiratory infection 12/04/2020   Acute bilateral low back pain without sciatica 06/09/2019   Sore throat 06/09/2018   Left knee pain 04/20/2018   Impacted cerumen of right ear 04/15/2017   Acute non-recurrent pansinusitis 10/09/2016   Neck swelling 08/07/2016   Mouth sores 07/24/2015   Depression 07/24/2015   TMJ dysfunction 07/03/2015   Acute confusional migraine 05/10/2015   Ankle instability, left 02/22/2015   Generalized anxiety disorder 01/31/2015   Allergic rhinitis 01/31/2015    Past Surgical History:  Procedure Laterality Date   ANKLE ARTHROSCOPY WITH RECONSTRUCTION Left 08/19/2021   Procedure: LEFT ANKLE ARTHROSCOPY, ARTHROSCOPIC TREATMENT OF TALUS OSTEOCHONDRAL LESION, LATERAL LIGAMENT RECONSTRUCTION, PERONEAL TENDON DEBRIDEMENT, REPAIR OF DISLOCATING PERONEAL TENDONS;  Surgeon: Terance Hart, MD;  Location: [facility];  Service: Orthopedics;  Laterality: Left;  LENGTH OF SURGERY: 120 MINUTES   CESAREAN SECTION  2004 and 2008   x 2   DIAGNOSTIC LAPAROSCOPY     TONSILLECTOMY      OB History  No obstetric history on file.      Home Medications    Prior to Admission medications   Medication Sig Start Date End Date Taking? Authorizing Provider  azelastine (ASTELIN) 0.1 % nasal spray USE 2 SPRAYS IN EACH NOSTRIL TWICE DAILY 04/07/23  Yes Sandre Kitty, MD  cetirizine (ZYRTEC) 10 MG tablet Take 1 tablet (10 mg total) by mouth daily. 02/03/23  Yes Boscia, Kathlynn Grate, NP  DULoxetine (CYMBALTA) 60 MG capsule Take 1 capsule (60 mg total) by mouth daily. 01/23/23  Yes  Fanny Dance, MD  fluticasone (FLONASE) 50 MCG/ACT nasal spray Place into both nostrils daily.   Yes [provider]  [name] 1/20 1-20 MG-MCG tablet Take 1 tablet by mouth daily. 09/08/22  Yes [provider]  ondansetron (ZOFRAN ODT) 4 MG disintegrating tablet Take 1-2 tablets (4-8 mg total) by mouth every 8 (eight) hours as needed for nausea or vomiting. 02/03/23  Yes Boscia, Heather E, NP  rizatriptan (MAXALT) 10 MG tablet Take 1 tablet (10 mg total) by mouth as needed for migraine. May repeat in 2 hours if needed 02/03/23  Yes Boscia, Kathlynn Grate, NP  tiZANidine (ZANAFLEX) 4 MG tablet Take 1 tablet (4 mg total) by mouth every 8 (eight) hours as needed for muscle spasms. 03/12/23  Yes Banister, Janace Aris, MD  traMADol (ULTRAM) 50 MG tablet Take 1 tablet (50 mg total) by mouth every 12 (twelve) hours as needed. 11/24/22  Yes Fanny Dance, MD  clotrimazole-betamethasone (LOTRISONE) cream Apply 1 Application topically 2 (two) times daily. 02/03/23   Carlean Jews, NP  ketorolac (TORADOL) 10 MG tablet Take 1 tablet (10 mg total) by mouth every 6 (six) hours as needed (pain). 03/12/23   Zenia Resides, MD  montelukast (SINGULAIR) 10 MG tablet TAKE 1 TABLET BY MOUTH AT BEDTIME 04/07/23   Sandre Kitty, MD    Family History Family History  Problem Relation Age of Onset   Hypertension Mother    Diabetes Mother    Osteoarthritis Mother    Fibromyalgia Mother    Heart attack Father        Defibrillator    Social History Social History   Tobacco Use   Smoking status: Never   Smokeless tobacco: Never  Vaping Use   Vaping status: Never Used  Substance Use Topics   Alcohol use: Yes    Comment: 1 a month or less   Drug use: No     Allergies   Sumatriptan and Sulfa antibiotics   Review of Systems Review of Systems Per HPI  Physical Exam Triage Vital Signs ED Triage Vitals  Encounter Vitals Group     BP 04/12/23 1655 132/84     Systolic BP Percentile  --      Diastolic BP Percentile --      Pulse Rate 04/12/23 1655 99     Resp 04/12/23 1655 18     Temp 04/12/23 1655 97.8 F (36.6 C)     Temp Source 04/12/23 1655 Oral     SpO2 --      Weight --      Height --      Head Circumference --      Peak Flow --      Pain Score 04/12/23 1651 2     Pain Loc --      Pain Education --      Exclude from Growth Chart --    No data found.  Updated Vital Signs BP  132/84 (BP Location: Right Arm)   Pulse 99   Temp 97.8 F (36.6 C) (Oral)   Resp 18   LMP 03/29/2023   Visual Acuity Right Eye Distance:   Left Eye Distance:   Bilateral Distance:    Right Eye Near:   Left Eye Near:    Bilateral Near:     Physical Exam Constitutional:      General: She is not in acute distress.    Appearance: Normal appearance. She is not toxic-appearing or diaphoretic.  HENT:     Head: Normocephalic and atraumatic.     Mouth/Throat:     Mouth: Mucous membranes are moist.     Pharynx: No posterior oropharyngeal erythema.  Eyes:     Extraocular Movements: Extraocular movements intact.     Conjunctiva/sclera: Conjunctivae normal.  Cardiovascular:     Rate and Rhythm: Normal rate and regular rhythm.     Pulses: Normal pulses.     Heart sounds: Normal heart sounds.  Pulmonary:     Effort: Pulmonary effort is normal. No respiratory distress.     Breath sounds: Normal breath sounds. No stridor. No wheezing, rhonchi or rales.  Abdominal:     General: Bowel sounds are normal. There is no distension.     Palpations: Abdomen is soft.     Tenderness: There is no abdominal tenderness.  Musculoskeletal:     Comments: Patient has some tenderness to palpation to left flank area that seems to circle around to left lateral rib area.  Neurological:     General: No focal deficit present.     Mental Status: She is alert and oriented to person, place, and time. Mental status is at baseline.  Psychiatric:        Mood and Affect: Mood normal.        Behavior:  Behavior normal.        Thought Content: Thought content normal.        Judgment: Judgment normal.      UC Treatments / Results  Labs (all labs ordered are listed, but only abnormal results are displayed) Labs Reviewed  POCT URINALYSIS DIP (MANUAL ENTRY) - Abnormal; Notable for the following components:      Result Value   Clarity, UA hazy (*)    Bilirubin, UA small (*)    Ketones, POC UA trace (5) (*)    Spec Grav, UA >=1.030 (*)    Blood, UA moderate (*)    Protein Ur, POC =100 (*)    All other components within normal limits  CBC  COMPREHENSIVE METABOLIC PANEL  POCT URINE PREGNANCY  CERVICOVAGINAL ANCILLARY ONLY    EKG   Radiology No results found.  Procedures Procedures (including critical care time)  Medications Ordered in UC Medications - No data to display  Initial Impression / Assessment and Plan / UC Course  I have reviewed the triage vital signs and the nursing notes.  Pertinent labs & imaging results that were available during my care of the patient were reviewed by me and considered in my medical decision making (see chart for details).     I suspect the patient's flank pain and gastrointestinal symptoms are not related.  Patient's flank pain was previously evaluated and seems to be musculoskeletal in nature on my physical exam as well.  Do not think any additional imaging of this area is necessary.  Differential diagnoses for gastrointestinal symptoms include viral illness versus food related illness.  There are no obvious signs of acute dehydration  or acute abdomen on exam so do not think that emergent evaluation or imaging of the abdomen is necessary.  Advised bland diet, adequate fluid hydration, ondansetron to take as needed.  She reports that she already has ondansetron at home.  Obtained CMP and CBC to rule out any worrisome etiology and patient was given strict ER precautions if symptoms persist or worsen.  UA unremarkable for any signs of infection.   Urine pregnancy test negative.  Discussed with patient that kidney stone is possible but also have a low suspicion for this.  Patient is not sure if blood is coming from urethra or vaginal canal so will send vaginal swab to rule out BV and yeast as patient has no concern for STD.  Advised strict follow-up if blood continues. Advised follow-up with PCP as well.  Patient verbalized understanding and was agreeable with plan. Final Clinical Impressions(s) / UC Diagnoses   Final diagnoses:  Nausea vomiting and diarrhea  Left flank pain  Urine pregnancy test negative     Discharge Instructions      Blood work is pending.  Will call if it is abnormal.  Please follow-up with PCP and go to the emergency department if symptoms persist or worsen.  Vaginal swab is also pending.  Will call if it is abnormal.    ED Prescriptions   None    PDMP not reviewed this encounter.   Gustavus Bryant, [place] 04/12/23 1758

## 2023-04-12 NOTE — Discharge Instructions (Signed)
Blood work is pending.  Will call if it is abnormal.  Please follow-up with PCP and go to the emergency department if symptoms persist or worsen.  Vaginal swab is also pending.  Will call if it is abnormal.

## 2023-04-13 LAB — CERVICOVAGINAL ANCILLARY ONLY
Bacterial Vaginitis (gardnerella): POSITIVE — AB
Candida Glabrata: NEGATIVE
Candida Vaginitis: NEGATIVE
Comment: NEGATIVE
Comment: NEGATIVE
Comment: NEGATIVE

## 2023-04-14 LAB — CBC
Hematocrit: 43 % (ref 34.0–46.6)
Hemoglobin: 14 g/dL (ref 11.1–15.9)
MCH: 30.8 pg (ref 26.6–33.0)
MCHC: 32.6 g/dL (ref 31.5–35.7)
MCV: 95 fL (ref 79–97)
Platelets: 239 10*3/uL (ref 150–450)
RBC: 4.55 x10E6/uL (ref 3.77–5.28)
RDW: 12.2 % (ref 11.7–15.4)
WBC: 7.8 10*3/uL (ref 3.4–10.8)

## 2023-04-14 LAB — COMPREHENSIVE METABOLIC PANEL
ALT: 9 IU/L (ref 0–32)
AST: 21 IU/L (ref 0–40)
Albumin: 4.4 g/dL (ref 3.9–4.9)
Alkaline Phosphatase: 61 IU/L (ref 44–121)
BUN/Creatinine Ratio: 17 (ref 9–23)
BUN: 13 mg/dL (ref 6–24)
Bilirubin Total: <0.2 mg/dL (ref 0.0–1.2)
CO2: 22 mmol/L (ref 20–29)
Calcium: 8.9 mg/dL (ref 8.7–10.2)
Chloride: 101 mmol/L (ref 96–106)
Creatinine, Ser: 0.77 mg/dL (ref 0.57–1.00)
Globulin, Total: 2.8 g/dL (ref 1.5–4.5)
Glucose: 139 mg/dL — ABNORMAL HIGH (ref 70–99)
Potassium: 3.7 mmol/L (ref 3.5–5.2)
Sodium: 140 mmol/L (ref 134–144)
Total Protein: 7.2 g/dL (ref 6.0–8.5)
eGFR: 99 mL/min/{1.73_m2} (ref >59)

## 2023-04-15 ENCOUNTER — Telehealth: Payer: Self-pay

## 2023-04-15 MED ORDER — METRONIDAZOLE 500 MG PO TABS: 500.0000 mg | ORAL_TABLET | Freq: Two times a day (BID) | ORAL | 0 refills | Status: AC

## 2023-04-15 NOTE — Telephone Encounter (Signed)
 Per protocol, pt requires tx with metronidazole. Attempted to reach patient x1. LVM. Rx sent to pharmacy on file.

## 2023-04-21 ENCOUNTER — Other Ambulatory Visit: Payer: Self-pay

## 2023-04-21 DIAGNOSIS — Z6830 Body mass index (BMI) 30.0-30.9, adult: Secondary | ICD-10-CM

## 2023-04-21 DIAGNOSIS — R5383 Other fatigue: Secondary | ICD-10-CM

## 2023-04-28 ENCOUNTER — Other Ambulatory Visit: Payer: 59

## 2023-04-28 DIAGNOSIS — R5383 Other fatigue: Secondary | ICD-10-CM

## 2023-04-28 DIAGNOSIS — Z6830 Body mass index (BMI) 30.0-30.9, adult: Secondary | ICD-10-CM | POA: Diagnosis not present

## 2023-04-29 LAB — CBC WITH DIFFERENTIAL/PLATELET
Basophils Absolute: 0 10*3/uL (ref 0.0–0.2)
Basos: 1 %
EOS (ABSOLUTE): 0.2 10*3/uL (ref 0.0–0.4)
Eos: 3 %
Hematocrit: 43.5 % (ref 34.0–46.6)
Hemoglobin: 14.4 g/dL (ref 11.1–15.9)
Immature Grans (Abs): 0 10*3/uL (ref 0.0–0.1)
Immature Granulocytes: 0 %
Lymphocytes Absolute: 1.7 10*3/uL (ref 0.7–3.1)
Lymphs: 32 %
MCH: 29.9 pg (ref 26.6–33.0)
MCHC: 33.1 g/dL (ref 31.5–35.7)
MCV: 90 fL (ref 79–97)
Monocytes Absolute: 0.4 10*3/uL (ref 0.1–0.9)
Monocytes: 8 %
Neutrophils Absolute: 3 10*3/uL (ref 1.4–7.0)
Neutrophils: 56 %
Platelets: 233 10*3/uL (ref 150–450)
RBC: 4.82 x10E6/uL (ref 3.77–5.28)
RDW: 12.2 % (ref 11.7–15.4)
WBC: 5.3 10*3/uL (ref 3.4–10.8)

## 2023-04-29 LAB — LIPID PANEL
Chol/HDL Ratio: 3.2 ratio (ref 0.0–4.4)
Cholesterol, Total: 152 mg/dL (ref 100–199)
HDL: 47 mg/dL (ref >39)
LDL Chol Calc (NIH): 76 mg/dL (ref 0–99)
Triglycerides: 169 mg/dL — ABNORMAL HIGH (ref 0–149)
VLDL Cholesterol Cal: 29 mg/dL (ref 5–40)

## 2023-04-29 LAB — COMPREHENSIVE METABOLIC PANEL
ALT: 13 IU/L (ref 0–32)
AST: 31 IU/L (ref 0–40)
Albumin: 4.3 g/dL (ref 3.9–4.9)
Alkaline Phosphatase: 55 IU/L (ref 44–121)
BUN/Creatinine Ratio: 16 (ref 9–23)
BUN: 11 mg/dL (ref 6–24)
Bilirubin Total: 0.4 mg/dL (ref 0.0–1.2)
CO2: 22 mmol/L (ref 20–29)
Calcium: 9.3 mg/dL (ref 8.7–10.2)
Chloride: 102 mmol/L (ref 96–106)
Creatinine, Ser: 0.67 mg/dL (ref 0.57–1.00)
Globulin, Total: 3 g/dL (ref 1.5–4.5)
Glucose: 94 mg/dL (ref 70–99)
Potassium: 4.6 mmol/L (ref 3.5–5.2)
Sodium: 140 mmol/L (ref 134–144)
Total Protein: 7.3 g/dL (ref 6.0–8.5)
eGFR: 112 mL/min/{1.73_m2} (ref >59)

## 2023-04-29 LAB — HEMOGLOBIN A1C
Est. average glucose Bld gHb Est-mCnc: 120 mg/dL
Hgb A1c MFr Bld: 5.8 % — ABNORMAL HIGH (ref 4.8–5.6)

## 2023-05-02 ENCOUNTER — Ambulatory Visit
Admission: RE | Admit: 2023-05-02 | Discharge: 2023-05-02 | Disposition: A | Discharge status: Home / Self Care | Payer: 59 | Source: Ambulatory Visit | Attending: Pain Medicine | Admitting: Pain Medicine

## 2023-05-02 DIAGNOSIS — M47816 Spondylosis without myelopathy or radiculopathy, lumbar region: Secondary | ICD-10-CM | POA: Diagnosis not present

## 2023-05-02 DIAGNOSIS — M5416 Radiculopathy, lumbar region: Secondary | ICD-10-CM

## 2023-05-02 DIAGNOSIS — M5126 Other intervertebral disc displacement, lumbar region: Secondary | ICD-10-CM | POA: Diagnosis not present

## 2023-05-05 ENCOUNTER — Ambulatory Visit (INDEPENDENT_AMBULATORY_CARE_PROVIDER_SITE_OTHER): Payer: 59 | Admitting: Family Medicine

## 2023-05-05 ENCOUNTER — Encounter: Payer: Self-pay | Admitting: Family Medicine

## 2023-05-05 VITALS — BP 125/88 | HR 95 | Ht 63.0 in | Wt 202.1 lb

## 2023-05-05 DIAGNOSIS — E669 Obesity, unspecified: Secondary | ICD-10-CM

## 2023-05-05 DIAGNOSIS — Z23 Encounter for immunization: Secondary | ICD-10-CM | POA: Diagnosis not present

## 2023-05-05 DIAGNOSIS — G43819 Other migraine, intractable, without status migrainosus: Secondary | ICD-10-CM

## 2023-05-05 DIAGNOSIS — G90522 Complex regional pain syndrome I of left lower limb: Secondary | ICD-10-CM | POA: Diagnosis not present

## 2023-05-05 DIAGNOSIS — F411 Generalized anxiety disorder: Secondary | ICD-10-CM | POA: Diagnosis not present

## 2023-05-05 DIAGNOSIS — M722 Plantar fascial fibromatosis: Secondary | ICD-10-CM | POA: Insufficient documentation

## 2023-05-05 NOTE — Assessment & Plan Note (Signed)
Patient complains of right sided pain on the plantar side just distal to the calcaneus.  Symptoms most likely represent plantar fasciitis.  Discussed home exercises patient can do and stretches.  Advised her that her physical medicine rehab doctor may be able to help her with additional treatment such as orthotics and to discuss it with them at the next visit.

## 2023-05-05 NOTE — Assessment & Plan Note (Addendum)
Discussed in depth with patient weight loss exercises that do not involve significant amount of exercise.  Recommended calorie counting between 14 and 1800 cal/day.  Recommended using phone apps to help with this and also recommended to continue walking as able even if small amounts at a time during the day.  Discussed healthy weight and wellness referral if desired.  Patient to read more about it first before deciding.

## 2023-05-05 NOTE — Assessment & Plan Note (Signed)
Patient takes Maxalt as needed usually 2-3 times a month.  Has not noticed a change in frequency since starting duloxetine.  Continue

## 2023-05-05 NOTE — Progress Notes (Unsigned)
Established Patient Office Visit  Subjective   Patient ID: Caitlin Bryant, female    DOB: Dec 31, 1980  Age: 42 y.o. MRN: [id number]  Chief Complaint  Patient presents with   Annual Exam    HPI  Migraines-patient is taking Maxalt as needed for her migraines.  She has migraines about 2-3 times a month on most months, more recently it was more frequent.  She believes that was because her menstrual cycle is off.  Left leg paresthesia-patient had ankle surgery on the left ankle and since that time has had progressive paresthesia and numbness in the left leg that now goes up to the hip.  She sees the physical medicine specialist for this and has an appointment on October.  She is on duloxetine for this.  Not currently taking any other medications for this.  Has an MRI scheduled.  Patient gets her mammogram and Pap smear through her OB/GYN at [facility].  Last Pap smear and mammogram was last year.  She goes again in October.  Weight management-patient has done dieting and exercise in the past and lost a significant amount weight and was down to 130 pounds before she injured her ankle and had surgery.  Subsequently she cannot exercise as often and has more difficulty losing weight.  We discussed dietary modifications she can make including calorie counting and eating low carbohydrate diet.  Discussed goals and ranges for calorie counting.  Discussed healthy weight and wellness clinic as an option for further help.  Patient walks daily but cannot do much more than walk as far as exercise.     The 10-year ASCVD risk score (Arnett DK, et al., 2019) is: 0.5%  Health Maintenance Due  Topic Date Due   HIV Screening  Never done   Hepatitis C Screening  Never done   PAP SMEAR-Modifier  07/16/2022   COVID-19 Vaccine (3 - 2023-24 season) 04/25/2023      Objective:     BP 125/88   Pulse 95   Ht 5\' 3"  (1.6 m)   Wt 202 lb 1.9 oz (91.7 kg)   LMP 03/29/2023   SpO2 97%   BMI 35.80 kg/m   {Vitals History (Optional):23777}  Physical Exam General: Alert and oriented CV: Regular rate and rhythm no murmurs Pulmonary: Lungs are bilaterally GI: Soft, normal bowel sounds MSK: Strength equal bilaterally Skin: Warm and dry no rashes Extremities: No pedal edema Psych: Pleasant affect   No results found for any visits on 05/05/23.      Assessment & Plan:   Need for influenza vaccination -     Flu vaccine trivalent PF, 6mos and older(Flulaval,Afluria,Fluarix,Fluzone)  Generalized anxiety disorder Assessment & Plan: On 60 mg duloxetine.  Continue current management.   Other migraine without status migrainosus, intractable Assessment & Plan: Patient takes Maxalt as needed usually 2-3 times a month.  Has not noticed a change in frequency since starting duloxetine.  Continue   Complex regional pain syndrome i of left lower limb Assessment & Plan: Left leg pain symptoms began after surgery for ankle injury.  Currently sees physical medicine rehabilitation specialist.  Sees them again next month.  Is on duloxetine.  Has tried tramadol, gabapentin in the past did not tolerate them.  Has had nerve blocks and local injections as well. Continue management per PM&R.   Plantar fasciitis, right Assessment & Plan: Patient complains of right sided pain on the plantar side just distal to the calcaneus.  Symptoms most likely represent plantar fasciitis.  Discussed home exercises patient can do and stretches.  Advised her that her physical medicine rehab doctor may be able to help her with additional treatment such as orthotics and to discuss it with them at the next visit.   Obesity (BMI 35.0-39.9 without comorbidity) Assessment & Plan: Discussed in depth with patient weight loss exercises that do not involve significant amount of exercise.  Recommended calorie counting between 14 and 1800 cal/day.  Recommended using phone apps to help with this and also recommended to continue  walking as able even if small amounts at a time during the day.  Discussed healthy weight and wellness referral if desired.  Patient to read more about it first before deciding.      Return in about 4 months (around 09/04/2023) for Weight.    Sandre Kitty, MD

## 2023-05-05 NOTE — Assessment & Plan Note (Signed)
On 60 mg duloxetine.  Continue current management.

## 2023-05-05 NOTE — Patient Instructions (Signed)
It was nice to see you today,  We addressed the following topics today: -Your A1c was in the prediabetic range.  Try to limit your carbohydrate intake is much as you can to avoid progressing to diabetes - Weight loss can be achieved through diet alone although exercise does help.  If you limit yourself to 1400 to 1800 cal a day you should gradually lose some weight over time as long as you are strict about counting everything you eat and being accurate with it. - Foods I would avoid to prevent worsening of your prediabetes would be: Any breads, pasta, starchy vegetables like potatoes, corn, sugary drinks - I would like to see back in approximately 4 months so we can follow-up on weight loss and other issues - We have a healthy weight and wellness clinic through [facility] that you might be interested in.  If you type [place] healthy weight and wellness into [employer] it would lead you to information about the program.  Have a great day,  Frederic Jericho, MD

## 2023-05-05 NOTE — Assessment & Plan Note (Signed)
Left leg pain symptoms began after surgery for ankle injury.  Currently sees physical medicine rehabilitation specialist.  Sees them again next month.  Is on duloxetine.  Has tried tramadol, gabapentin in the past did not tolerate them.  Has had nerve blocks and local injections as well. Continue management per PM&R.

## 2023-05-10 ENCOUNTER — Ambulatory Visit: Payer: 59 | Admitting: Physical Medicine & Rehabilitation

## 2023-05-20 ENCOUNTER — Encounter: Payer: 59 | Attending: Physical Medicine & Rehabilitation | Admitting: Physical Medicine & Rehabilitation

## 2023-05-20 ENCOUNTER — Encounter: Payer: Self-pay | Admitting: Physical Medicine & Rehabilitation

## 2023-05-20 VITALS — BP 145/79 | HR 97 | Ht 63.0 in | Wt 200.0 lb

## 2023-05-20 DIAGNOSIS — G90522 Complex regional pain syndrome I of left lower limb: Secondary | ICD-10-CM | POA: Insufficient documentation

## 2023-05-20 DIAGNOSIS — S8412XA Injury of peroneal nerve at lower leg level, left leg, initial encounter: Secondary | ICD-10-CM | POA: Insufficient documentation

## 2023-05-20 DIAGNOSIS — M722 Plantar fascial fibromatosis: Secondary | ICD-10-CM | POA: Insufficient documentation

## 2023-05-20 DIAGNOSIS — I10 Essential (primary) hypertension: Secondary | ICD-10-CM | POA: Diagnosis not present

## 2023-05-20 DIAGNOSIS — G894 Chronic pain syndrome: Secondary | ICD-10-CM | POA: Diagnosis not present

## 2023-05-20 MED ORDER — DULOXETINE HCL 30 MG PO CPEP
30.0000 mg | ORAL_CAPSULE | Freq: Every day | ORAL | 0 refills | Status: DC
Start: 2023-05-20 — End: 2023-07-07

## 2023-05-20 MED ORDER — PRAZOSIN HCL 1 MG PO CAPS
1.0000 mg | ORAL_CAPSULE | Freq: Two times a day (BID) | ORAL | Status: DC
Start: 2023-05-20 — End: 2023-07-29

## 2023-05-20 NOTE — Progress Notes (Addendum)
Subjective:    Patient ID: Caitlin Bryant, female    DOB: September 24, 1980, 42 y.o.   MRN: [id number]  HPI HPI  Caitlin Bryant is a 42 y.o. year old female  who  has a past medical history of Anxiety, Headache, and MRSA (methicillin resistant staph aureus) culture positive.   They are presenting to PM&R clinic as a new patient for pain management evaluation. They were referred for treatment of chronic pain. She reports her pain started after her ankle tendon surgery Aug 19, 2021.  She reports she had the surgery due to frequent ankle sprains.  She had a nerve block at the time behind her knee.  She completed physical therapy after her surgery.  Since this time has has had severe pain in her left lower extremity.  Patient reports her pain began August 19, 2021 when she had left ankle tendon surgery.  Since this time.  She has had worsening pain in her left lower extremity.  Her leg is very painful from her lower left thigh down to her foot.  She previously worked as a Runner, broadcasting/film/video.  She has been much less active and has had weight gain.  She is only able to walk very short distances.  Pain is progressively getting worse.  She has numbness and tingling in her distal toes.  Light touch is a very painful around her foot and ankle.  The sensation of showering is very painful.  Her pain is worse at night.  She has had a lot of swelling in her lower extremities since this time from her knee down to her foot.  She has been followed by neurology.  She was seen by Dr. August Saucer who was suspecting CRPS.  She initially had an EMG that was reported to have altered sensory function however EMG was repeated by Dr. Loleta Chance neurology and no significant abnormalities were noted.   Red flag symptoms: Patient denies saddle anesthesia, loss of bowel or bladder continence, new weakness, new numbness/tingling, or pain waking up at nighttime.   Medications tried: Gabapentin -unsure dose, only able to take HS due to sedation, liminal  benefit for pain Ibuprofen 200-400mg  2-3 times a day     Interval History 10/01/22 She reports continued pain in her left lower extremity that continues to worsen.  She has not noted significant benefit with Lyrica however she has been able to tolerate this better than gabapentin thus far.  She has started physical therapy.  She reports that desensitization techniques are tolerable initially however hours later she will have severe pain in this area.  Her pain continues to cover her entire left leg.  She sometimes feels a burning sensation in her toes.  She has a lot of paresthesias in her leg as well.  She will also be trying in aquatic therapy in addition to a land-based therapy.  Occasionally her leg will become red.  Her left leg has increased swelling compared to her right leg.   Interval History 11/24/22 Patient is here for follow-up of her left lower extremity pain.  She continues to work with physical therapy.  She also has some difficulties recently due to illness with her husband as well.  She feels like her pain is largely unchanged.  Possibly with some increased ability to push to the pain per therapy notes.  She is scheduled for some more pool therapy.  She is only taking Lyrica at night and this continues to cause her sedation.  While she does not  feel like the leg is insensate to touch, she feels that she cannot sense deep pressure as much on her left leg as she can on her right leg.  She is frustrated with her limitations.   Interval History 01/22/23 Patient is here for follow-up of her left lower extremity pain.  She reports she had 1 sympathetic block completed, provided benefit for about a day and pain was back to baseline within a week.  She has an additional injection scheduled next week.  She has not been using tramadol because she is cautious with this medication and does not want to develop an addiction.  She reports she used half a tablet of tramadol on one occasion.  She has not  noticed significant pain relief however she also did not have any significant side effects.  Her PCP has started her on Cymbalta 30 mg, she is not sure if this is providing significant benefit.  She reports having occasional muscle cramps in her right leg and foot.  She has noticed "was much slower on her left than her right foot.   Interval History 05/20/2023 Patient is here for follow-up regarding her lower extremity pain suspected to be due to the CPRS.  She reports her pain continues to be severe and is worse from prior.  She continues with duloxetine but does not feel like it is helping her mood or pain.  She brought her tramadol into be destroyed because she did not feel like this was helping.  She reports she is not very active although tries to do which she can.  After activities she has a lot of swelling in her left leg.  She reports she has been diagnosed with plantar fasciitis right lower extremity.  She has been offered a spinal cord stimulator but does not want this.  She reports occasional numbness of her left great toe.  She requests letter to have her dog as a emotional support animal, she says caring for him helps with her and forces her to be more active.   Pain Inventory Average Pain 8 Pain Right Now 8 My pain is sharp, burning, dull, stabbing, tingling, and aching  In the last 24 hours, has pain interfered with the following? General activity 1 Relation with others 2 Enjoyment of life 1 What TIME of day is your pain at its worst? morning , daytime, evening, and night Sleep (in general) Poor  Pain is worse with: walking, bending, sitting, inactivity, standing, and some activites Pain improves with:  . Relief from Meds: 0  Family History  Problem Relation Age of Onset   Hypertension Mother    Diabetes Mother    Osteoarthritis Mother    Fibromyalgia Mother    Heart attack Father        Defibrillator   Social History   Socioeconomic History   Marital status:  Married    Spouse name: Not on file   Number of children: Not on file   Years of education: Not on file   Highest education level: Not on file  Occupational History   Not on file  Tobacco Use   Smoking status: Never   Smokeless tobacco: Never  Vaping Use   Vaping status: Never Used  Substance and Sexual Activity   Alcohol use: Yes    Comment: 1 a month or less   Drug use: No   Sexual activity: Yes    Partners: Male  Other Topics Concern   Not on file  Social History Narrative  Are you right handed or left handed? Right   Are you currently employed ? no      Do you live at home alone?husband and 2 daughters   Caffeine none   What type of home do you live in: 1 story or 2 story? One with stairs inside       Social Determinants of Health   Financial Resource Strain: Not on file  Food Insecurity: Not on file  Transportation Needs: Not on file  Physical Activity: Not on file  Stress: Not on file  Social Connections: Not on file   Past Surgical History:  Procedure Laterality Date   ANKLE ARTHROSCOPY WITH RECONSTRUCTION Left 08/19/2021   Procedure: LEFT ANKLE ARTHROSCOPY, ARTHROSCOPIC TREATMENT OF TALUS OSTEOCHONDRAL LESION, LATERAL LIGAMENT RECONSTRUCTION, PERONEAL TENDON DEBRIDEMENT, REPAIR OF DISLOCATING PERONEAL TENDONS;  Surgeon: Terance Hart, MD;  Location: [facility];  Service: Orthopedics;  Laterality: Left;  LENGTH OF SURGERY: 120 MINUTES   CESAREAN SECTION  2004 and 2008   x 2   DIAGNOSTIC LAPAROSCOPY     TONSILLECTOMY     Past Surgical History:  Procedure Laterality Date   ANKLE ARTHROSCOPY WITH RECONSTRUCTION Left 08/19/2021   Procedure: LEFT ANKLE ARTHROSCOPY, ARTHROSCOPIC TREATMENT OF TALUS OSTEOCHONDRAL LESION, LATERAL LIGAMENT RECONSTRUCTION, PERONEAL TENDON DEBRIDEMENT, REPAIR OF DISLOCATING PERONEAL TENDONS;  Surgeon: Terance Hart, MD;  Location: [facility];  Service: Orthopedics;  Laterality: Left;  LENGTH OF  SURGERY: 120 MINUTES   CESAREAN SECTION  2004 and 2008   x 2   DIAGNOSTIC LAPAROSCOPY     TONSILLECTOMY     Past Medical History:  Diagnosis Date   Anxiety    Headache    MRSA (methicillin resistant staph aureus) culture positive    5 or more years ago, right thumb   BP (!) 145/79   Pulse 97   Ht 5\' 3"  (1.6 m)   Wt 200 lb (90.7 kg)   SpO2 97%   BMI 35.43 kg/m   Opioid Risk Score:   Fall Risk Score:  `1  Depression screen [facility] 2/9     05/20/2023    2:36 PM 05/05/2023   11:01 AM 02/03/2023    9:16 AM 01/22/2023    2:36 PM 12/16/2022    9:39 AM 10/01/2022    3:52 PM 09/17/2022    9:17 AM  Depression screen PHQ 2/9  Decreased Interest 0 2 2 3 1  0 1  Down, Depressed, Hopeless 0 1 2 3 1  0 1  PHQ - 2 Score 0 3 4 6 2  0 2  Altered sleeping  2 3  2  2   Tired, decreased energy  1 3  1  1   Change in appetite  1 3  2  2   Feeling bad or failure about yourself   1 2  2   0  Trouble concentrating  2 2  2  1   Moving slowly or fidgety/restless  1 2  1   0  Suicidal thoughts  0 0  0  0  PHQ-9 Score  11 19  12  8   Difficult doing work/chores  Somewhat difficult Very difficult  Very difficult        Review of Systems  Musculoskeletal:  Positive for gait problem.  All other systems reviewed and are negative.     Objective:   Physical Exam    05/20/2023    2:39 PM 05/20/2023    2:34 PM 05/05/2023   11:31 AM  Vitals with  BMI  Height  5\' 3"    Weight  200 lbs   BMI  35.44   Systolic 145 159   Diastolic 79 105   Pulse  97 95     Gen: no distress, normal appearing HEENT: oral mucosa pink and moist, NCAT Chest: normal effort, normal rate of breathing Abd: soft, non-distended Psych: pleasant, normal affect Skin: intact Neuro: CN 2-12 grossly intact, follows commands,  Strength bilateral upper extremities 5/5 throughout Strength bilateral lower extremities at least 4/5 limited by pain Sensation intact light touch in all 4 extremities however she reports his altered throughout L  leg Antalgic gait Musculoskeletal she reports pain with light palpation in her LLE below the thigh but this is greatest below the knee, she also reports pain on her right lower extremity to light palpation to her right lesser degree Pain with movement of L ankle in all directions, pain with movement L knee  Slight swelling noted in LUE compared to RUE-unchanged Healed surgical incision on her left ankle     Assessment & Plan:  CPRS LLE pain after L ankle tendon surgery 2022 -Normal EMG LLE 08/10/22 -Agree with neurology and orthopedics, suspect CRPS type 1 -She reports poor benefit with Lyrica, duloxetine, tramadol, ibuprofen -She was discharged from physical therapy -Completed form for handicap parking renewal  -She reports poor results with sympathetic blocks x 2 -ORT mod -Pain agreement completed prior visit -Will decrease Cymbalta to 30 mg for 1 month then discontinue -Consider amitriptyline or nortriptyline  -Will trial prazosin for CPRS, she reports her blood pressure has been running borderline high -Spinal cord stimulator-she declines at this time -Consider low-dose opioid medication however avoid escalation, discussed with patient and she would like to hold off at this time -MRI ordered by Dr. Lorrine Kin neurosurgery and spine -I think this was a good idea to check but does not appear to show cause of her pain  Muscle cramps -Discussed trying magnesium supplement  Hypertension -Prazosin may help this as well although it started primarily to see if it will help her pain  Plantar fasciitis -She reports being diagnosed with this recently, difficult to assess due to diffuse pain  with CPRS -Advised heel stretch, arch supporting shoe insoles  Addendum  11/13- called pt regarding duloxetine, she was having withdrawal symptoms stopping 30mg  dose, will order 20mg  dose to help wean

## 2023-05-26 ENCOUNTER — Other Ambulatory Visit: Payer: Self-pay | Admitting: Physical Medicine & Rehabilitation

## 2023-05-26 DIAGNOSIS — S8412XA Injury of peroneal nerve at lower leg level, left leg, initial encounter: Secondary | ICD-10-CM

## 2023-05-27 ENCOUNTER — Other Ambulatory Visit: Payer: Self-pay | Admitting: Physical Medicine & Rehabilitation

## 2023-05-27 ENCOUNTER — Other Ambulatory Visit: Payer: Self-pay | Admitting: Family Medicine

## 2023-05-27 DIAGNOSIS — S8412XA Injury of peroneal nerve at lower leg level, left leg, initial encounter: Secondary | ICD-10-CM

## 2023-05-27 DIAGNOSIS — J3089 Other allergic rhinitis: Secondary | ICD-10-CM

## 2023-06-01 DIAGNOSIS — G90522 Complex regional pain syndrome I of left lower limb: Secondary | ICD-10-CM | POA: Diagnosis not present

## 2023-06-02 ENCOUNTER — Other Ambulatory Visit: Payer: Self-pay | Admitting: Family Medicine

## 2023-06-02 DIAGNOSIS — J3089 Other allergic rhinitis: Secondary | ICD-10-CM

## 2023-07-06 ENCOUNTER — Telehealth: Payer: Self-pay

## 2023-07-06 NOTE — Telephone Encounter (Signed)
Patient called stating she weaned off Duloxetine and was not sure if she was suppose to start another medication if so nothing was called in and she is finished with Duloxetine. Please advise.

## 2023-07-07 MED ORDER — DULOXETINE HCL 20 MG PO CPEP: 20.0000 mg | ORAL_CAPSULE | Freq: Every day | ORAL | 0 refills | Status: DC

## 2023-07-07 NOTE — Telephone Encounter (Signed)
Patient states she has been off Duloxetine since Saturday and she believe she is having withdrawals. Please advise.

## 2023-07-13 ENCOUNTER — Encounter: Payer: Self-pay | Admitting: Family Medicine

## 2023-07-13 ENCOUNTER — Ambulatory Visit (INDEPENDENT_AMBULATORY_CARE_PROVIDER_SITE_OTHER): Payer: 59 | Admitting: Family Medicine

## 2023-07-13 ENCOUNTER — Other Ambulatory Visit: Payer: Self-pay | Admitting: Family Medicine

## 2023-07-13 VITALS — BP 129/84 | HR 87 | Ht 63.0 in | Wt 208.1 lb

## 2023-07-13 DIAGNOSIS — B070 Plantar wart: Secondary | ICD-10-CM | POA: Insufficient documentation

## 2023-07-13 DIAGNOSIS — D2330 Other benign neoplasm of skin of unspecified part of face: Secondary | ICD-10-CM | POA: Insufficient documentation

## 2023-07-13 DIAGNOSIS — L821 Other seborrheic keratosis: Secondary | ICD-10-CM | POA: Insufficient documentation

## 2023-07-13 DIAGNOSIS — J3089 Other allergic rhinitis: Secondary | ICD-10-CM

## 2023-07-13 NOTE — Assessment & Plan Note (Signed)
Recommended patient try over-the-counter treatment such as Compound W.

## 2023-07-13 NOTE — Patient Instructions (Addendum)
It was nice to see you today,  We addressed the following topics today: The areas on your back and chest seem most likely to be a benign mole or a seborrheic keratosis. - You can schedule appoint with me for me to do a shave biopsy of the spot on your back - There spots on your face and most consistent with seborrheic hyperplasia which is a benign condition. - I will refer you to be dermatologist, but it may take a few months for you to get in to see them. - For the wart on your foot you can try over-the-counter treatments such as Compound W plantar wart pads.    Have a great day,  Frederic Jericho, MD

## 2023-07-13 NOTE — Progress Notes (Unsigned)
   Acute Office Visit  Subjective:     Patient ID: Caitlin Bryant, female    DOB: 23-Feb-1981, 42 y.o.   MRN: [id number]  Chief Complaint  Patient presents with   spot on back    HPI Patient is in today for multiple skin concerns.  Patient noticed a small bump on her back approximately a week ago.  It is not painful or pruritic.  Patient feels like there may be something similar on her chest.  Also had not noticed this prior to a week ago.  Patient has or small papules on her left cheek that have been present for over a year that she has questions about.  Her mother has a history of skin cancer and she states her mother was more concerned about it than she was.  These had not changed substantially in size or shape over the past year.  No bleeding noted on any of these.  Patient also complains of a small plantar wart on her right foot.  Wants to know if there is anything we can do to treat it.  Is painful but patient also endorses that she has been "picking at it"    ROS      Objective:    BP 129/84   Pulse 87   Ht 5\' 3"  (1.6 m)   Wt 208 lb 1.9 oz (94.4 kg)   SpO2 96%   BMI 36.87 kg/m  {Vitals History (Optional):23777}  Physical Exam General: Alert, oriented Skin: Small light brown circular papular soft lesion on the lower thoracic back midline.  Smaller 5 mm circular flesh-colored papular lesion on the left breast.  4 distinct 1 to 2 mm in diameter flesh-colored papules on the left anterior face.      No results found for any visits on 07/13/23.      Assessment & Plan:   Fibrous papule of face Assessment & Plan: The for papules on the patient's face seem most consistent with a fibroma but may also be seborrheic hyperplasia.  Low suspicion for SCC/BCC.  Patient would like dermatology referral to discuss removal or biopsy.  Orders: -     Ambulatory referral to Dermatology  Seborrheic keratosis Assessment & Plan: The lesion on the patient's back seems  most consistent with a SK.  Could be benign nevus.  Patient would like shave biopsy.  Will schedule an appointment to conduct shave biopsy to send for pathology.   Plantar wart Assessment & Plan: Recommended patient try over-the-counter treatment such as Compound W.        Return for Shave biopsy.  Sandre Kitty, MD

## 2023-07-13 NOTE — Assessment & Plan Note (Signed)
The lesion on the patient's back seems most consistent with a SK.  Could be benign nevus.  Patient would like shave biopsy.  Will schedule an appointment to conduct shave biopsy to send for pathology.

## 2023-07-13 NOTE — Assessment & Plan Note (Signed)
The for papules on the patient's face seem most consistent with a fibroma but may also be seborrheic hyperplasia.  Low suspicion for SCC/BCC.  Patient would like dermatology referral to discuss removal or biopsy.

## 2023-07-14 DIAGNOSIS — Z01419 Encounter for gynecological examination (general) (routine) without abnormal findings: Secondary | ICD-10-CM | POA: Diagnosis not present

## 2023-07-14 DIAGNOSIS — Z1231 Encounter for screening mammogram for malignant neoplasm of breast: Secondary | ICD-10-CM | POA: Diagnosis not present

## 2023-07-16 ENCOUNTER — Encounter: Payer: 59 | Admitting: Physical Medicine & Rehabilitation

## 2023-07-19 ENCOUNTER — Other Ambulatory Visit: Payer: Self-pay | Admitting: Nurse Practitioner

## 2023-07-19 DIAGNOSIS — J3089 Other allergic rhinitis: Secondary | ICD-10-CM

## 2023-07-19 NOTE — Telephone Encounter (Signed)
Hi Dan. Can you  fill this for the patient? She has you listed as PCP.  Thanks. Herbert Seta

## 2023-07-29 ENCOUNTER — Encounter: Payer: Self-pay | Admitting: Physical Medicine & Rehabilitation

## 2023-07-29 ENCOUNTER — Encounter: Payer: 59 | Attending: Physical Medicine & Rehabilitation | Admitting: Physical Medicine & Rehabilitation

## 2023-07-29 VITALS — Ht 63.0 in | Wt 206.0 lb

## 2023-07-29 DIAGNOSIS — I10 Essential (primary) hypertension: Secondary | ICD-10-CM

## 2023-07-29 DIAGNOSIS — G90522 Complex regional pain syndrome I of left lower limb: Secondary | ICD-10-CM | POA: Diagnosis not present

## 2023-07-29 MED ORDER — PRAZOSIN HCL 1 MG PO CAPS: 1.0000 mg | ORAL_CAPSULE | Freq: Two times a day (BID) | ORAL | 3 refills | Status: DC

## 2023-07-29 MED ORDER — GABAPENTIN 100 MG PO CAPS: 100.0000 mg | ORAL_CAPSULE | Freq: Every evening | ORAL | 4 refills | Status: DC | PRN

## 2023-07-29 NOTE — Progress Notes (Signed)
Subjective:    Patient ID: Caitlin Bryant, female    DOB: [birth year], 42 y.o.   MRN: [id number]  HPI HPI  Caitlin Bryant is a 42 y.o. year old female  who  has a past medical history of Anxiety, Headache, and MRSA (methicillin resistant staph aureus) culture positive.   They are presenting to PM&R clinic as a new patient for pain management evaluation. They were referred for treatment of chronic pain. She reports her pain started after her ankle tendon surgery Aug 19, 2021.  She reports she had the surgery due to frequent ankle sprains.  She had a nerve block at the time behind her knee.  She completed physical therapy after her surgery.  Since this time has has had severe pain in her left lower extremity.  Patient reports her pain began August 19, 2021 when she had left ankle tendon surgery.  Since this time.  She has had worsening pain in her left lower extremity.  Her leg is very painful from her lower left thigh down to her foot.  She previously worked as a Runner, broadcasting/film/video.  She has been much less active and has had weight gain.  She is only able to walk very short distances.  Pain is progressively getting worse.  She has numbness and tingling in her distal toes.  Light touch is a very painful around her foot and ankle.  The sensation of showering is very painful.  Her pain is worse at night.  She has had a lot of swelling in her lower extremities since this time from her knee down to her foot.  She has been followed by neurology.  She was seen by Dr. August Saucer who was suspecting CRPS.  She initially had an EMG that was reported to have altered sensory function however EMG was repeated by Dr. Loleta Chance neurology and no significant abnormalities were noted.   Red flag symptoms: Patient denies saddle anesthesia, loss of bowel or bladder continence, new weakness, new numbness/tingling, or pain waking up at nighttime.   Medications tried: Gabapentin -unsure dose, only able to take HS due to sedation, liminal  benefit for pain Ibuprofen 200-400mg  2-3 times a day     Interval History 10/01/22 She reports continued pain in her left lower extremity that continues to worsen.  She has not noted significant benefit with Lyrica however she has been able to tolerate this better than gabapentin thus far.  She has started physical therapy.  She reports that desensitization techniques are tolerable initially however hours later she will have severe pain in this area.  Her pain continues to cover her entire left leg.  She sometimes feels a burning sensation in her toes.  She has a lot of paresthesias in her leg as well.  She will also be trying in aquatic therapy in addition to a land-based therapy.  Occasionally her leg will become red.  Her left leg has increased swelling compared to her right leg.   Interval History 11/24/22 Patient is here for follow-up of her left lower extremity pain.  She continues to work with physical therapy.  She also has some difficulties recently due to illness with her husband as well.  She feels like her pain is largely unchanged.  Possibly with some increased ability to push to the pain per therapy notes.  She is scheduled for some more pool therapy.  She is only taking Lyrica at night and this continues to cause her sedation.  While she does not  feel like the leg is insensate to touch, she feels that she cannot sense deep pressure as much on her left leg as she can on her right leg.  She is frustrated with her limitations.   Interval History 01/22/23 Patient is here for follow-up of her left lower extremity pain.  She reports she had 1 sympathetic block completed, provided benefit for about a day and pain was back to baseline within a week.  She has an additional injection scheduled next week.  She has not been using tramadol because she is cautious with this medication and does not want to develop an addiction.  She reports she used half a tablet of tramadol on one occasion.  She has not  noticed significant pain relief however she also did not have any significant side effects.  Her PCP has started her on Cymbalta 30 mg, she is not sure if this is providing significant benefit.  She reports having occasional muscle cramps in her right leg and foot.  She has noticed "was much slower on her left than her right foot.   Interval History 05/20/2023 Patient is here for follow-up regarding her lower extremity pain suspected to be due to the CPRS.  She reports her pain continues to be severe and is worse from prior.  She continues with duloxetine but does not feel like it is helping her mood or pain.  She brought her tramadol into be destroyed because she did not feel like this was helping.  She reports she is not very active although tries to do which she can.  After activities she has a lot of swelling in her left leg.  She reports she has been diagnosed with plantar fasciitis right lower extremity.  She has been offered a spinal cord stimulator but does not want this.  She reports occasional numbness of her left great toe.  She requests letter to have her dog as a emotional support animal, she says caring for him helps with her and forces her to be more active.  Interval History 07/29/23 Patient is here for follow-up regarding chronic CPRS pain.  Her primary concern today is that she had significant withdrawal symptoms when trying to discontinue duloxetine 30 mg.  She has restarted this medication and has been afraid to go down to the 20 mg daily dose.  She has not yet started prazosin, was not available at the pharmacy.  Pain continues to be severe in her left lower extremity and largely unchanged from prior visit.  She is still interested in spinal cord stimulator.  She would like to continue using a handicap placard due to her limited ambulation distance.  Pain Inventory Average Pain 8 Pain Right Now 4 My pain is constant, sharp, burning, dull, stabbing, tingling, and aching  In the last  24 hours, has pain interfered with the following? General activity 2 Relation with others 2 Enjoyment of life 2 What TIME of day is your pain at its worst? morning , daytime, evening, and night Sleep (in general) Poor  Pain is worse with: walking, bending, sitting, inactivity, standing, and some activites Pain improves with:  nothing Relief from Meds: 3  Family History  Problem Relation Age of Onset   Hypertension Mother    Diabetes Mother    Osteoarthritis Mother    Fibromyalgia Mother    Heart attack Father        Defibrillator   Social History   Socioeconomic History   Marital status: Married  Spouse name: Not on file   Number of children: Not on file   Years of education: Not on file   Highest education level: Not on file  Occupational History   Not on file  Tobacco Use   Smoking status: Never   Smokeless tobacco: Never  Vaping Use   Vaping status: Never Used  Substance and Sexual Activity   Alcohol use: Yes    Comment: 1 a month or less   Drug use: No   Sexual activity: Yes    Partners: Male  Other Topics Concern   Not on file  Social History Narrative   Are you right handed or left handed? Right   Are you currently employed ? no      Do you live at home alone?husband and 2 daughters   Caffeine none   What type of home do you live in: 1 story or 2 story? One with stairs inside       Social Determinants of Health   Financial Resource Strain: Not on file  Food Insecurity: Not on file  Transportation Needs: Not on file  Physical Activity: Not on file  Stress: Not on file  Social Connections: Not on file   Past Surgical History:  Procedure Laterality Date   ANKLE ARTHROSCOPY WITH RECONSTRUCTION Left 08/19/2021   Procedure: LEFT ANKLE ARTHROSCOPY, ARTHROSCOPIC TREATMENT OF TALUS OSTEOCHONDRAL LESION, LATERAL LIGAMENT RECONSTRUCTION, PERONEAL TENDON DEBRIDEMENT, REPAIR OF DISLOCATING PERONEAL TENDONS;  Surgeon: Terance Hart, MD;  Location:  [facility];  Service: Orthopedics;  Laterality: Left;  LENGTH OF SURGERY: 120 MINUTES   CESAREAN SECTION  2004 and 2008   x 2   DIAGNOSTIC LAPAROSCOPY     TONSILLECTOMY     Past Surgical History:  Procedure Laterality Date   ANKLE ARTHROSCOPY WITH RECONSTRUCTION Left 08/19/2021   Procedure: LEFT ANKLE ARTHROSCOPY, ARTHROSCOPIC TREATMENT OF TALUS OSTEOCHONDRAL LESION, LATERAL LIGAMENT RECONSTRUCTION, PERONEAL TENDON DEBRIDEMENT, REPAIR OF DISLOCATING PERONEAL TENDONS;  Surgeon: Terance Hart, MD;  Location: [facility];  Service: Orthopedics;  Laterality: Left;  LENGTH OF SURGERY: 120 MINUTES   CESAREAN SECTION  2004 and 2008   x 2   DIAGNOSTIC LAPAROSCOPY     TONSILLECTOMY     Past Medical History:  Diagnosis Date   Anxiety    Headache    MRSA (methicillin resistant staph aureus) culture positive    5 or more years ago, right thumb   Ht 5\' 3"  (1.6 m)   Wt 206 lb (93.4 kg)   BMI 36.49 kg/m   Opioid Risk Score:   Fall Risk Score:  `1  Depression screen PHQ 2/9     07/29/2023    2:12 PM 07/13/2023    4:02 PM 05/20/2023    2:36 PM 05/05/2023   11:01 AM 02/03/2023    9:16 AM 01/22/2023    2:36 PM 12/16/2022    9:39 AM  Depression screen PHQ 2/9  Decreased Interest 1 2 0 2 2 3 1   Down, Depressed, Hopeless 1 2 0 1 2 3 1   PHQ - 2 Score 2 4 0 3 4 6 2   Altered sleeping  2  2 3  2   Tired, decreased energy  2  1 3  1   Change in appetite  2  1 3  2   Feeling bad or failure about yourself   1  1 2  2   Trouble concentrating  2  2 2  2   Moving slowly or fidgety/restless  1  1 2  1   Suicidal thoughts  0  0 0  0  PHQ-9 Score  14  11 19  12   Difficult doing work/chores  Somewhat difficult  Somewhat difficult Very difficult  Very difficult      Review of Systems  Musculoskeletal:  Positive for gait problem.       Pain in both feet, left hip pain down to left foot  All other systems reviewed and are negative.      Objective:   Physical Exam     07/29/2023    2:08 PM 07/13/2023    4:01 PM 05/20/2023    2:39 PM  Vitals with BMI  Height 5\' 3"  5\' 3"    Weight 206 lbs 208 lbs 2 oz   BMI 36.5 36.88   Systolic  129 145  Diastolic  84 79  Pulse  87      Gen: no distress, normal appearing HEENT: oral mucosa pink and moist, NCAT Chest: normal effort, normal rate of breathing Abd: soft, non-distended Psych: pleasant, normal affect Skin: intact Neuro: CN 2-12 grossly intact, follows commands,  Strength bilateral upper extremities 5/5 throughout Strength bilateral lower extremities at least 4/5 limited by pain Sensation intact light touch in all 4 extremities however she reports his altered throughout L leg Antalgic gait Musculoskeletal she reports pain with light palpation in her LLE below the thigh but this is greatest below the knee, she also reports pain on her right lower extremity to light palpation to her right lesser degree-unchanged from prior visit Slight swelling noted in LUE compared to RUE-unchanged Healed surgical incision on her left ankle     Assessment & Plan:  CPRS LLE pain after L ankle tendon surgery 2022 -Normal EMG LLE 08/10/22 -Agree with neurology and orthopedics, suspect CRPS type 1 -She reports poor benefit with Lyrica, duloxetine, tramadol, ibuprofen -She was discharged from physical therapy -Completed form for handicap parking renewal  -She reports poor results with sympathetic blocks x 2 -ORT mod -Pain agreement completed prior visit -Will decrease Cymbalta to alternate between 20 mg and 30 mg for 2 weeks then decrease to 20 mg daily.   -[facility], [facility] can do compounded duloxetine at doses lower than 20 mg-that we could use to wean down.  She would like to think about this.  Patient will give me a call after she is successfully on the 20 mg dose and we can discuss it further. -Consider amitriptyline or nortriptyline  -Reorder prazosin for CPRS, 1 mg twice daily -Spinal cord  stimulator-she declines at this time -Consider low-dose opioid medication however avoid escalation, discussed with patient and she would like to hold off at this time -MRI ordered by Dr. Lorrine Kin neurosurgery and spine -I think this was a good idea to check - does not appear to show cause of her pain -Could consider retrying therapy as aquatic therapy-patient will consider -Handicap parking form provided  Muscle cramps -Discussed trying magnesium supplement  Hypertension -Prazosin started for pain, monitor blood pressure  Plantar fasciitis -She reports being diagnosed with this recently, difficult to assess due to diffuse pain  with CPRS -Advised heel stretch, arch supporting shoe insoles

## 2023-08-02 ENCOUNTER — Other Ambulatory Visit: Payer: Self-pay | Admitting: Family Medicine

## 2023-08-02 DIAGNOSIS — J3089 Other allergic rhinitis: Secondary | ICD-10-CM

## 2023-08-03 ENCOUNTER — Telehealth: Payer: Self-pay

## 2023-08-03 MED ORDER — DULOXETINE HCL 20 MG PO CPEP: 20.0000 mg | ORAL_CAPSULE | Freq: Every day | ORAL | 0 refills | Status: DC

## 2023-08-03 MED ORDER — DULOXETINE HCL 30 MG PO CPEP: 30.0000 mg | ORAL_CAPSULE | Freq: Every day | ORAL | 0 refills | Status: DC

## 2023-08-03 NOTE — Telephone Encounter (Signed)
Patient called stating she needs a refill on Duloxetine 20 mg and Duloxetine 30 mg. According to the last note patient is to alternate 20 mg and 30 mg for 2 weeks then 20 mg daily. Patient states she needs #7 of 30 mg to finish the 2 weeks. Both strengths called into [facility]

## 2023-08-05 ENCOUNTER — Ambulatory Visit: Payer: 59 | Admitting: Family Medicine

## 2023-08-05 ENCOUNTER — Telehealth: Payer: Self-pay

## 2023-08-05 NOTE — Telephone Encounter (Signed)
LVM to reschedule 09/08/23 appt for the afternoon or another day as Dr. George Goltz will be out of the office that morning.

## 2023-08-10 ENCOUNTER — Ambulatory Visit: Payer: 59 | Admitting: Family Medicine

## 2023-08-16 ENCOUNTER — Ambulatory Visit: Payer: Self-pay

## 2023-08-16 ENCOUNTER — Telehealth: Payer: Self-pay

## 2023-08-16 NOTE — Telephone Encounter (Signed)
Patient called in wanting the number to the dermatologist  pt was adivised

## 2023-08-24 ENCOUNTER — Ambulatory Visit: Payer: 59 | Admitting: Family Medicine

## 2023-08-24 ENCOUNTER — Other Ambulatory Visit: Payer: Self-pay | Admitting: Family Medicine

## 2023-08-24 DIAGNOSIS — J3089 Other allergic rhinitis: Secondary | ICD-10-CM

## 2023-09-08 ENCOUNTER — Ambulatory Visit: Payer: 59 | Admitting: Family Medicine

## 2023-09-30 ENCOUNTER — Other Ambulatory Visit: Payer: Self-pay | Admitting: Family Medicine

## 2023-09-30 DIAGNOSIS — J3089 Other allergic rhinitis: Secondary | ICD-10-CM

## 2023-10-06 ENCOUNTER — Encounter: Payer: Self-pay | Admitting: Family Medicine

## 2023-10-06 ENCOUNTER — Ambulatory Visit (INDEPENDENT_AMBULATORY_CARE_PROVIDER_SITE_OTHER): Payer: 59 | Admitting: Family Medicine

## 2023-10-06 VITALS — BP 121/78 | HR 100 | Ht 63.0 in | Wt 206.4 lb

## 2023-10-06 DIAGNOSIS — G90522 Complex regional pain syndrome I of left lower limb: Secondary | ICD-10-CM

## 2023-10-06 DIAGNOSIS — D2330 Other benign neoplasm of skin of unspecified part of face: Secondary | ICD-10-CM | POA: Diagnosis not present

## 2023-10-06 NOTE — Progress Notes (Signed)
   Established Patient Office Visit  Subjective   Patient ID: Caitlin Bryant, female    DOB: 1981/06/24  Age: 43 y.o. MRN: [id number]  Chief Complaint  Patient presents with   Medical Management of Chronic Issues    HPI  Patient has been trying to decrease her Cymbalta.  She has titrated down to 20 mg and can go 3 days without taking it before her symptoms become too much.  She starts to have headaches, fatigue, nausea as well has feeling more irritable and manic.  Patient was prescribed prazosin to take while she transitions off of the duloxetine.  She took it 1 time before bed and states that her heart race for the next few days.  Has not taken it since then.  Patient has been reading online what other people with her condition have been doing and states that some people have had significant improvements with something called scrambler therapy.  We talked about this and does not appear there is a provider who offers that in [place].  Patient is trying to lose weight still but the duloxetine tapering is making it difficult for her.  Does not want to talk to a nutritionist at this time.   The 10-year ASCVD risk score (Arnett DK, et al., 2019) is: 0.6%  Health Maintenance Due  Topic Date Due   HIV Screening  Never done   Hepatitis C Screening  Never done   COVID-19 Vaccine (3 - Pfizer risk series) 12/23/2019   Cervical Cancer Screening (HPV/Pap Cotest)  01/12/2020      Objective:     BP 121/78   Pulse 100   Ht 5\' 3"  (1.6 m)   Wt 206 lb 6.4 oz (93.6 kg)   SpO2 98%   BMI 36.56 kg/m    Physical Exam General: Alert, oriented Pulmonary: No regular stress Psych: Pleasant affect.   No results found for any visits on 10/06/23.      Assessment & Plan:   Complex regional pain syndrome i of left lower limb Assessment & Plan: Patient is trying to taper off duloxetine but is stuck at 20 mg.  Advised her to reach out to her PM&R specialist regarding compounding a  smaller dose of the duloxetine.  Patient did not tolerate prazosin and has not been taking it.  Patient mentioned to scrambler therapy which I was unfamiliar with.  Appears to be noninvasive electrical stimulation similar to TENS.  Advised her it was worth looking into if she can find somewhere local that provides her services but I was not able to find 1.   Fibrous papule of face Assessment & Plan: Patient has not heard from the dermatologist.  I provided the phone number location of the clinic we referred her to.      Return in about 4 months (around 02/03/2024) for pain, weight.    Sandre Kitty, MD

## 2023-10-06 NOTE — Patient Instructions (Addendum)
It was nice to see you today,  We addressed the following topics today: -The dermatology referral was sent to [facility].  Their number is [phone].  You can call them to schedule an appointment.  The referral is sent to them. - You can talk to your physical medicine doctor about using the compounding pharmacy to get duloxetine at a lower dose than 20 mg.  Until that time you can take your duloxetine every other day if you can tolerate that or daily if you need to - You do not need to keep taking the praises and if it is causing your heart to race and other unwanted side effects. -We can also do cryotherapy on the spot on your back here if you would like.  Have a great day,  Frederic Jericho, MD

## 2023-10-06 NOTE — Assessment & Plan Note (Signed)
Patient has not heard from the dermatologist.  I provided the phone number location of the clinic we referred her to.

## 2023-10-06 NOTE — Assessment & Plan Note (Signed)
Patient is trying to taper off duloxetine but is stuck at 20 mg.  Advised her to reach out to her PM&R specialist regarding compounding a smaller dose of the duloxetine.  Patient did not tolerate prazosin and has not been taking it.  Patient mentioned to scrambler therapy which I was unfamiliar with.  Appears to be noninvasive electrical stimulation similar to TENS.  Advised her it was worth looking into if she can find somewhere local that provides her services but I was not able to find 1.

## 2023-10-08 ENCOUNTER — Telehealth: Payer: Self-pay | Admitting: Physical Medicine & Rehabilitation

## 2023-10-08 NOTE — Telephone Encounter (Signed)
Patient called in and LVM at 9:43am requesting medication refill on DULoxetine (CYMBALTA) 30 MG capsule , pharmacy [facility] [place]

## 2023-10-09 MED ORDER — DULOXETINE HCL 30 MG PO CPEP: 30.0000 mg | ORAL_CAPSULE | Freq: Every day | ORAL | 1 refills | Status: DC

## 2023-10-25 ENCOUNTER — Other Ambulatory Visit: Payer: Self-pay | Admitting: Physical Medicine & Rehabilitation

## 2023-10-25 ENCOUNTER — Telehealth: Payer: Self-pay | Admitting: Physical Medicine & Rehabilitation

## 2023-10-25 NOTE — Telephone Encounter (Signed)
 Patient needing refill on Duloxetine 20mg .

## 2023-10-26 MED ORDER — DULOXETINE HCL 20 MG PO CPEP: 20.0000 mg | ORAL_CAPSULE | Freq: Every day | ORAL | 0 refills | Status: DC

## 2023-10-28 ENCOUNTER — Ambulatory Visit: Payer: 59 | Admitting: Physical Medicine & Rehabilitation

## 2023-11-05 ENCOUNTER — Ambulatory Visit: Payer: 59 | Admitting: Physical Medicine & Rehabilitation

## 2023-11-08 ENCOUNTER — Encounter: Payer: Self-pay | Admitting: Physical Medicine & Rehabilitation

## 2023-11-08 ENCOUNTER — Encounter: Payer: 59 | Attending: Physical Medicine & Rehabilitation | Admitting: Physical Medicine & Rehabilitation

## 2023-11-08 VITALS — BP 134/84 | HR 98 | Ht 63.0 in | Wt 203.6 lb

## 2023-11-08 DIAGNOSIS — I10 Essential (primary) hypertension: Secondary | ICD-10-CM | POA: Diagnosis not present

## 2023-11-08 DIAGNOSIS — G894 Chronic pain syndrome: Secondary | ICD-10-CM | POA: Insufficient documentation

## 2023-11-08 DIAGNOSIS — G90522 Complex regional pain syndrome I of left lower limb: Secondary | ICD-10-CM | POA: Diagnosis not present

## 2023-11-08 MED ORDER — DULOXETINE HCL 20 MG PO CPEP: 20.0000 mg | ORAL_CAPSULE | Freq: Every day | ORAL | 4 refills | Status: DC

## 2023-11-08 NOTE — Progress Notes (Signed)
 Subjective:    Patient ID: Caitlin Bryant, female    DOB: 09-May-1981, 43 y.o.   MRN: [id number]  HPI HPI  Caitlin Bryant is a 43 y.o. year old female  who  has a past medical history of Anxiety, Headache, and MRSA (methicillin resistant staph aureus) culture positive.   They are presenting to PM&R clinic as a new patient for pain management evaluation. They were referred for treatment of chronic pain. She reports her pain started after her ankle tendon surgery Aug 19, 2021.  She reports she had the surgery due to frequent ankle sprains.  She had a nerve block at the time behind her knee.  She completed physical therapy after her surgery.  Since this time has has had severe pain in her left lower extremity.  Patient reports her pain began August 19, 2021 when she had left ankle tendon surgery.  Since this time.  She has had worsening pain in her left lower extremity.  Her leg is very painful from her lower left thigh down to her foot.  She previously worked as a Runner, broadcasting/film/video.  She has been much less active and has had weight gain.  She is only able to walk very short distances.  Pain is progressively getting worse.  She has numbness and tingling in her distal toes.  Light touch is a very painful around her foot and ankle.  The sensation of showering is very painful.  Her pain is worse at night.  She has had a lot of swelling in her lower extremities since this time from her knee down to her foot.  She has been followed by neurology.  She was seen by Dr. August Saucer who was suspecting CRPS.  She initially had an EMG that was reported to have altered sensory function however EMG was repeated by Dr. Loleta Chance neurology and no significant abnormalities were noted.   Red flag symptoms: Patient denies saddle anesthesia, loss of bowel or bladder continence, new weakness, new numbness/tingling, or pain waking up at nighttime.   Medications tried: Gabapentin -unsure dose, only able to take HS due to sedation, liminal  benefit for pain Ibuprofen 200-400mg  2-3 times a day     Interval History 10/01/22 She reports continued pain in her left lower extremity that continues to worsen.  She has not noted significant benefit with Lyrica however she has been able to tolerate this better than gabapentin thus far.  She has started physical therapy.  She reports that desensitization techniques are tolerable initially however hours later she will have severe pain in this area.  Her pain continues to cover her entire left leg.  She sometimes feels a burning sensation in her toes.  She has a lot of paresthesias in her leg as well.  She will also be trying in aquatic therapy in addition to a land-based therapy.  Occasionally her leg will become red.  Her left leg has increased swelling compared to her right leg.   Interval History 11/24/22 Patient is here for follow-up of her left lower extremity pain.  She continues to work with physical therapy.  She also has some difficulties recently due to illness with her husband as well.  She feels like her pain is largely unchanged.  Possibly with some increased ability to push to the pain per therapy notes.  She is scheduled for some more pool therapy.  She is only taking Lyrica at night and this continues to cause her sedation.  While she does not  feel like the leg is insensate to touch, she feels that she cannot sense deep pressure as much on her left leg as she can on her right leg.  She is frustrated with her limitations.   Interval History 01/22/23 Patient is here for follow-up of her left lower extremity pain.  She reports she had 1 sympathetic block completed, provided benefit for about a day and pain was back to baseline within a week.  She has an additional injection scheduled next week.  She has not been using tramadol because she is cautious with this medication and does not want to develop an addiction.  She reports she used half a tablet of tramadol on one occasion.  She has not  noticed significant pain relief however she also did not have any significant side effects.  Her PCP has started her on Cymbalta 30 mg, she is not sure if this is providing significant benefit.  She reports having occasional muscle cramps in her right leg and foot.  She has noticed "was much slower on her left than her right foot.   Interval History 05/20/2023 Patient is here for follow-up regarding her lower extremity pain suspected to be due to the CPRS.  She reports her pain continues to be severe and is worse from prior.  She continues with duloxetine but does not feel like it is helping her mood or pain.  She brought her tramadol into be destroyed because she did not feel like this was helping.  She reports she is not very active although tries to do which she can.  After activities she has a lot of swelling in her left leg.  She reports she has been diagnosed with plantar fasciitis right lower extremity.  She has been offered a spinal cord stimulator but does not want this.  She reports occasional numbness of her left great toe.  She requests letter to have her dog as a emotional support animal, she says caring for him helps with her and forces her to be more active.  Interval History 07/29/23 Patient is here for follow-up regarding chronic CPRS pain.  Her primary concern today is that she had significant withdrawal symptoms when trying to discontinue duloxetine 30 mg.  She has restarted this medication and has been afraid to go down to the 20 mg daily dose.  She has not yet started prazosin, was not available at the pharmacy.  Pain continues to be severe in her left lower extremity and largely unchanged from prior visit.  She is still interested in spinal cord stimulator.  She would like to continue using a handicap placard due to her limited ambulation distance.  Interval History 11/08/22 Patient is here for follow-up regarding her CRPS pain.   Patient has been continue to monitor wean down on  duloxetine because she would not feel like it is helping her pain.  She recently ran out of the 20 mg dose so has been using 30 mg duloxetine.  Patient did not tolerate prazosin, and also did not help her pain.  She continues to have severe pain in her left leg.  She tries to stay active but pain limits her activities.  She also has been developing some pain and swelling in her left arm over the past few weeks.  She has been taking gabapentin 100 mg at night intermittently, higher doses caused sedation.  Pain Inventory Average Pain 4 Pain Right Now 5 My pain is constant, sharp, burning, dull, stabbing, tingling, and aching  In the last 24 hours, has pain interfered with the following? General activity 2 Relation with others 2 Enjoyment of life 2 What TIME of day is your pain at its worst? morning , daytime, evening, and night Sleep (in general) Poor  Pain is worse with: walking, bending, sitting, inactivity, standing, and some activites Pain improves with: rest and nothing Relief from Meds: 3  Family History  Problem Relation Age of Onset   Hypertension Mother    Diabetes Mother    Osteoarthritis Mother    Fibromyalgia Mother    Heart attack Father        Defibrillator   Social History   Socioeconomic History   Marital status: Married    Spouse name: Not on file   Number of children: Not on file   Years of education: Not on file   Highest education level: Not on file  Occupational History   Not on file  Tobacco Use   Smoking status: Never   Smokeless tobacco: Never  Vaping Use   Vaping status: Never Used  Substance and Sexual Activity   Alcohol use: Yes    Comment: 1 a month or less   Drug use: No   Sexual activity: Yes    Partners: Male  Other Topics Concern   Not on file  Social History Narrative   Are you right handed or left handed? Right   Are you currently employed ? no      Do you live at home alone?husband and 2 daughters   Caffeine none   What  type of home do you live in: 1 story or 2 story? One with stairs inside       Social Drivers of Health   Financial Resource Strain: Not on file  Food Insecurity: Not on file  Transportation Needs: Not on file  Physical Activity: Not on file  Stress: Not on file  Social Connections: Not on file   Past Surgical History:  Procedure Laterality Date   ANKLE ARTHROSCOPY WITH RECONSTRUCTION Left 08/19/2021   Procedure: LEFT ANKLE ARTHROSCOPY, ARTHROSCOPIC TREATMENT OF TALUS OSTEOCHONDRAL LESION, LATERAL LIGAMENT RECONSTRUCTION, PERONEAL TENDON DEBRIDEMENT, REPAIR OF DISLOCATING PERONEAL TENDONS;  Surgeon: Terance Hart, MD;  Location: [facility];  Service: Orthopedics;  Laterality: Left;  LENGTH OF SURGERY: 120 MINUTES   CESAREAN SECTION  2004 and 2008   x 2   DIAGNOSTIC LAPAROSCOPY     TONSILLECTOMY     Past Surgical History:  Procedure Laterality Date   ANKLE ARTHROSCOPY WITH RECONSTRUCTION Left 08/19/2021   Procedure: LEFT ANKLE ARTHROSCOPY, ARTHROSCOPIC TREATMENT OF TALUS OSTEOCHONDRAL LESION, LATERAL LIGAMENT RECONSTRUCTION, PERONEAL TENDON DEBRIDEMENT, REPAIR OF DISLOCATING PERONEAL TENDONS;  Surgeon: Terance Hart, MD;  Location: [facility];  Service: Orthopedics;  Laterality: Left;  LENGTH OF SURGERY: 120 MINUTES   CESAREAN SECTION  2004 and 2008   x 2   DIAGNOSTIC LAPAROSCOPY     TONSILLECTOMY     Past Medical History:  Diagnosis Date   Anxiety    Headache    MRSA (methicillin resistant staph aureus) culture positive    5 or more years ago, right thumb   BP 134/84   Pulse 98   Ht 5\' 3"  (1.6 m)   Wt 203 lb 9.6 oz (92.4 kg)   SpO2 98%   BMI 36.07 kg/m   Opioid Risk Score:   Fall Risk Score:  `1  Depression screen [facility] 2/9     11/08/2023    3:17  PM 10/06/2023    2:33 PM 07/29/2023    2:12 PM 07/13/2023    4:02 PM 05/20/2023    2:36 PM 05/05/2023   11:01 AM 02/03/2023    9:16 AM  Depression screen PHQ 2/9  Decreased  Interest 1 2 1 2  0 2 2  Down, Depressed, Hopeless 1 2 1 2  0 1 2  PHQ - 2 Score 2 4 2 4  0 3 4  Altered sleeping  2  2  2 3   Tired, decreased energy  2  2  1 3   Change in appetite  2  2  1 3   Feeling bad or failure about yourself   2  1  1 2   Trouble concentrating  2  2  2 2   Moving slowly or fidgety/restless  2  1  1 2   Suicidal thoughts  0  0  0 0  PHQ-9 Score  16  14  11 19   Difficult doing work/chores  Very difficult  Somewhat difficult  Somewhat difficult Very difficult      Review of Systems  Musculoskeletal:  Positive for gait problem.       Pain in both feet, left hip pain down to left foot  All other systems reviewed and are negative.      Objective:   Physical Exam    11/08/2023    3:17 PM 10/06/2023    2:31 PM 07/29/2023    2:08 PM  Vitals with BMI  Height 5\' 3"  5\' 3"  5\' 3"   Weight 203 lbs 10 oz 206 lbs 6 oz 206 lbs  BMI 36.08 36.57 36.5  Systolic 134 121   Diastolic 84 78   Pulse 98 100      Gen: no distress, normal appearing HEENT: oral mucosa pink and moist, NCAT Chest: normal effort, normal rate of breathing Abd: soft, non-distended Psych: pleasant, normal affect Skin: intact Neuro: CN 2-12 grossly intact, follows commands,  Strength bilateral upper extremities 5/5 throughout Strength bilateral lower extremities at least 4/5 limited by pain Sensation intact light touch in all 4 extremities however she reports his altered throughout L leg Antalgic gait Musculoskeletal she reports pain with light palpation in her LLE below the thigh but this is greatest below the knee, she also reports pain on her right lower extremity to light palpation to her right lesser degree-unchanged from prior visit Slight swelling noted in LUE compared to RUE-unchanged Healed surgical incision on her left ankle Mild swelling and mild erythema noted distal left upper extremity.  Mild TTP to palpation throughout her low distal left upper extremity also.     Assessment & Plan:   CPRS LLE pain after L ankle tendon surgery 2022-she also appears to be developing symptoms over her left upper extremity also -Normal EMG LLE 08/10/22 -Agree with neurology and orthopedics, suspect CRPS type 1 -She reports poor benefit with Lyrica, duloxetine, tramadol, ibuprofen -She was discharged from physical therapy -Completed form for handicap parking renewal  -She reports poor results with sympathetic blocks x 2 -ORT mod -Pain agreement compl eted prior visit -Will decrease Cymbalta back to 20 mg-ordered -After 1 month patient can start Cymbalta 15 mg.  This would be a compounded medication.  [facility] duloxetine 15 mg #30 with 1 refill.  Will plan to wean medication further over time likely with 10 mg and 5 mg doses. --Consider amitriptyline or nortriptyline  -Discontinue prazosin for CPRS, 1 mg twice daily-added to allergy list -  Spinal cord stimulator-patient would like to think about this -Consider low-dose opioid medication however avoid escalation, discussed with patient and she would like to hold off at this time -MRI ordered by Dr. Lorrine Kin neurosurgery and spine -does not appear to show cause of her pain -Could consider retrying PT-I think aquatic therapy at [facility] [place] location would be a good option  Muscle cramps -Discussed trying magnesium supplement prior visit  Hypertension-controlled today -Prazosin discontinued-she did not tolerate this medication well  Plantar fasciitis -She reports being diagnosed with this recently, difficult to assess due to diffuse pain  with CPRS -Advised heel stretch, arch supporting shoe insoles

## 2023-11-23 ENCOUNTER — Other Ambulatory Visit: Payer: Self-pay | Admitting: Family Medicine

## 2023-11-23 ENCOUNTER — Other Ambulatory Visit: Payer: Self-pay | Admitting: Physical Medicine & Rehabilitation

## 2023-11-23 DIAGNOSIS — J3089 Other allergic rhinitis: Secondary | ICD-10-CM

## 2023-12-27 ENCOUNTER — Other Ambulatory Visit: Payer: Self-pay | Admitting: Physical Medicine & Rehabilitation

## 2024-01-05 ENCOUNTER — Other Ambulatory Visit: Payer: Self-pay | Admitting: Family Medicine

## 2024-01-05 DIAGNOSIS — J3089 Other allergic rhinitis: Secondary | ICD-10-CM

## 2024-01-07 NOTE — Progress Notes (Signed)
 [facility] [address] [place] [address] Phone: [phone] Subjective:   Delwyn Filippo, am serving as a scribe for Dr. Ronnell Coins.  I'm seeing this patient by the request  of:  Laneta Pintos, MD  CC: complex regional pain syndrome  DVV:[id number]  BERTRICE LEDER is a 43 y.o. female coming in with complaint of CRPS for the L side of her body for the past 2.5 years. Pain from the toes up the entire leg to the L side of her face. Also has arm pain that radiates into the fingers. Patient said that everything bothers her.   Also c/o R heel and R elbow pain. Pain at the back of heel. Pain will be sharp at times. Also having pain over R lateral epicondyle. Hard to pick up milk.    Patient has been seen by multiple providers and seems to be more complex regional pain syndrome type I.  Here for further evaluation.  Has taken multiple different medications including Lyrica , Cymbalta , tramadol , ibuprofen , physical therapy.  Poor results with 2 sympathetic blocks.  Patient was considering a spinal stimulator as well as potentially aquatic therapy.  Past Medical History:  Diagnosis Date   Anxiety    Headache    MRSA (methicillin resistant staph aureus) culture positive    5 or more years ago, right thumb   Past Surgical History:  Procedure Laterality Date   ANKLE ARTHROSCOPY WITH RECONSTRUCTION Left 08/19/2021   Procedure: LEFT ANKLE ARTHROSCOPY, ARTHROSCOPIC TREATMENT OF TALUS OSTEOCHONDRAL LESION, LATERAL LIGAMENT RECONSTRUCTION, PERONEAL TENDON DEBRIDEMENT, REPAIR OF DISLOCATING PERONEAL TENDONS;  Surgeon: Donnamarie Gables, MD;  Location: [facility];  Service: Orthopedics;  Laterality: Left;  LENGTH OF SURGERY: 120 MINUTES   CESAREAN SECTION  2004 and 2008   x 2   DIAGNOSTIC LAPAROSCOPY     TONSILLECTOMY     Social History   Socioeconomic History   Marital status: Married    Spouse name: Not on file   Number of  children: Not on file   Years of education: Not on file   Highest education level: Not on file  Occupational History   Not on file  Tobacco Use   Smoking status: Never   Smokeless tobacco: Never  Vaping Use   Vaping status: Never Used  Substance and Sexual Activity   Alcohol use: Yes    Comment: 1 a month or less   Drug use: No   Sexual activity: Yes    Partners: Male  Other Topics Concern   Not on file  Social History Narrative   Are you right handed or left handed? Right   Are you currently employed ? no      Do you live at home alone?husband and 2 daughters   Caffeine  none   What type of home do you live in: 1 story or 2 story? One with stairs inside       Social Drivers of Health   Financial Resource Strain: Not on file  Food Insecurity: Not on file  Transportation Needs: Not on file  Physical Activity: Not on file  Stress: Not on file  Social Connections: Not on file   Allergies  Allergen Reactions   Prazosin  Other (See Comments)   Sumatriptan  Nausea And Vomiting    Severe nausea and vomiting  Other Reaction(s): GI Intolerance   Sulfa Antibiotics Rash   Family History  Problem Relation Age of Onset   Hypertension Mother  Diabetes Mother    Osteoarthritis Mother    Fibromyalgia Mother    Heart attack Father        Defibrillator    Current Outpatient Medications (Endocrine & Metabolic):    [name] 1/20 1-20 MG-MCG tablet, Take 1 tablet by mouth daily.   Current Outpatient Medications (Respiratory):    azelastine  (ASTELIN ) 0.1 % nasal spray, USE 2 SPRAYS IN EACH NOSTRIL TWICE DAILY   cetirizine  (ZYRTEC ) 10 MG tablet, TAKE 1 TABLET BY MOUTH DAILY   fluticasone  (FLONASE ) 50 MCG/ACT nasal spray, Place into both nostrils daily.   montelukast  (SINGULAIR ) 10 MG tablet, TAKE 1 TABLET BY MOUTH AT BEDTIME  Current Outpatient Medications (Analgesics):    ketorolac  (TORADOL ) 10 MG tablet, Take 1 tablet (10 mg total) by mouth every 6 (six) hours as needed  (pain).   rizatriptan  (MAXALT ) 10 MG tablet, Take 1 tablet (10 mg total) by mouth as needed for migraine. May repeat in 2 hours if needed   Current Outpatient Medications (Other):    DULoxetine  (CYMBALTA ) 20 MG capsule, Take 1 capsule (20 mg total) by mouth daily.   gabapentin  (NEURONTIN ) 100 MG capsule, TAKE 1 CAPSULE BY MOUTH DAILY AT BEDTIME AS NEEDED   ondansetron  (ZOFRAN  ODT) 4 MG disintegrating tablet, Take 1-2 tablets (4-8 mg total) by mouth every 8 (eight) hours as needed for nausea or vomiting.   tiZANidine  (ZANAFLEX ) 4 MG tablet, Take 1 tablet (4 mg total) by mouth every 8 (eight) hours as needed for muscle spasms.   Reviewed prior external information including notes and imaging from  primary care provider As well as notes that were available from care everywhere and other healthcare systems.  Past medical history, social, surgical and family history all reviewed in electronic medical record.  No pertanent information unless stated regarding to the chief complaint.   Review of Systems:  No headache, visual changes, nausea, vomiting, diarrhea, constipation, dizziness, abdominal pain, skin rash, fevers, chills, night sweats, weight loss, swollen lymph nodes, , joint swelling, chest pain, shortness of breath, mood changes. POSITIVE muscle aches, muscle aches, skin changes  Objective  Blood pressure 122/86, pulse 86, height 5\' 3"  (1.6 m), weight 205 lb (93 kg), SpO2 98%.   General: No apparent distress alert and oriented x3 mood and affect normal, dressed appropriately.  HEENT: Pupils equal, extraocular movements intact  Respiratory: Patient's speak in full sentences and does not appear short of breath  Cardiovascular: No lower extremity edema, non tender, no erythema  Patient is highly tender to palpation in multiple different areas.  Patient continues to same that is very difficult.  Tightness noted in the area patient does have flushing noted of the left side of the face.     Impression and Recommendations:     The above documentation has been reviewed and is accurate and complete Tricia Pledger M Ibrohim Simmers, DO

## 2024-01-10 ENCOUNTER — Ambulatory Visit: Admitting: Family Medicine

## 2024-01-10 ENCOUNTER — Ambulatory Visit (INDEPENDENT_AMBULATORY_CARE_PROVIDER_SITE_OTHER)

## 2024-01-10 VITALS — BP 122/86 | HR 86 | Ht 63.0 in | Wt 205.0 lb

## 2024-01-10 DIAGNOSIS — G90522 Complex regional pain syndrome I of left lower limb: Secondary | ICD-10-CM | POA: Diagnosis not present

## 2024-01-10 DIAGNOSIS — R079 Chest pain, unspecified: Secondary | ICD-10-CM

## 2024-01-10 DIAGNOSIS — R200 Anesthesia of skin: Secondary | ICD-10-CM | POA: Diagnosis not present

## 2024-01-10 DIAGNOSIS — M2550 Pain in unspecified joint: Secondary | ICD-10-CM | POA: Diagnosis not present

## 2024-01-10 DIAGNOSIS — R768 Other specified abnormal immunological findings in serum: Secondary | ICD-10-CM

## 2024-01-10 LAB — COMPREHENSIVE METABOLIC PANEL WITH GFR
ALT: 10 U/L (ref 0–35)
AST: 17 U/L (ref 0–37)
Albumin: 4.1 g/dL (ref 3.5–5.2)
Alkaline Phosphatase: 45 U/L (ref 39–117)
BUN: 14 mg/dL (ref 6–23)
CO2: 25 meq/L (ref 19–32)
Calcium: 9.1 mg/dL (ref 8.4–10.5)
Chloride: 104 meq/L (ref 96–112)
Creatinine, Ser: 0.55 mg/dL (ref 0.40–1.20)
GFR: 112.74 mL/min (ref >60.00)
Glucose, Bld: 102 mg/dL — ABNORMAL HIGH (ref 70–99)
Potassium: 4 meq/L (ref 3.5–5.1)
Sodium: 138 meq/L (ref 135–145)
Total Bilirubin: 0.3 mg/dL (ref 0.2–1.2)
Total Protein: 7.2 g/dL (ref 6.0–8.3)

## 2024-01-10 LAB — IBC PANEL
Iron: 69 ug/dL (ref 42–145)
Saturation Ratios: 14.1 % — ABNORMAL LOW (ref 20.0–50.0)
TIBC: 490 ug/dL — ABNORMAL HIGH (ref 250.0–450.0)
Transferrin: 350 mg/dL (ref 212.0–360.0)

## 2024-01-10 LAB — CBC WITH DIFFERENTIAL/PLATELET
Basophils Absolute: 0 10*3/uL (ref 0.0–0.1)
Basophils Relative: 0.5 % (ref 0.0–3.0)
Eosinophils Absolute: 0.2 10*3/uL (ref 0.0–0.7)
Eosinophils Relative: 3.1 % (ref 0.0–5.0)
HCT: 41.4 % (ref 36.0–46.0)
Hemoglobin: 14.2 g/dL (ref 12.0–15.0)
Lymphocytes Relative: 40 % (ref 12.0–46.0)
Lymphs Abs: 2.5 10*3/uL (ref 0.7–4.0)
MCHC: 34.2 g/dL (ref 30.0–36.0)
MCV: 89.1 fl (ref 78.0–100.0)
Monocytes Absolute: 0.4 10*3/uL (ref 0.1–1.0)
Monocytes Relative: 6.7 % (ref 3.0–12.0)
Neutro Abs: 3.1 10*3/uL (ref 1.4–7.7)
Neutrophils Relative %: 49.7 % (ref 43.0–77.0)
Platelets: 250 10*3/uL (ref 150.0–400.0)
RBC: 4.65 Mil/uL (ref 3.87–5.11)
RDW: 13.2 % (ref 11.5–15.5)
WBC: 6.2 10*3/uL (ref 4.0–10.5)

## 2024-01-10 LAB — URIC ACID: Uric Acid, Serum: 4.1 mg/dL (ref 2.4–7.0)

## 2024-01-10 LAB — C-REACTIVE PROTEIN: CRP: 1.1 mg/dL (ref 0.5–20.0)

## 2024-01-10 LAB — SEDIMENTATION RATE: Sed Rate: 5 mm/h (ref 0–20)

## 2024-01-10 NOTE — Patient Instructions (Addendum)
 Chest xray MRI brain [phone] Labs today See me in 2 months

## 2024-01-11 LAB — TSH: TSH: 1.53 u[IU]/mL (ref 0.35–5.50)

## 2024-01-11 LAB — FERRITIN: Ferritin: 33.7 ng/mL (ref 10.0–291.0)

## 2024-01-11 LAB — VITAMIN D 25 HYDROXY (VIT D DEFICIENCY, FRACTURES): VITD: 21.6 ng/mL — ABNORMAL LOW (ref 30.00–100.00)

## 2024-01-11 LAB — VITAMIN B12: Vitamin B-12: 207 pg/mL — ABNORMAL LOW (ref 211–911)

## 2024-01-11 NOTE — Assessment & Plan Note (Signed)
 Patient has been diagnosed with a complex regional pain syndrome.  Seems to be the left lower extremity fracture is having more systemic findings as well as some skin changes recently.  Patient has had more flexion recently.  Recent illness that may have caused a cough.  Exacerbated tenderness.  Chest x-ray ordered today to further evaluate.  In addition seems to be mostly left-sided and do feel that we should consider the possibility of brain imaging with all of the different systemic findings noted.  Home exercises and icing regimen.  Discussed which activities to do with joints to avoid.  Increase activity slowly.  Follow-up again in 6 to 8 weeks.  Discussed some laboratory workup could be helpful as well.  Has failed a significant number of conservative therapies already.  Could consider other medications but once again has had difficulty with those as well.  Attempting to titrate off of Cymbalta  and so do not want to make any drastic changes at the moment.

## 2024-01-12 ENCOUNTER — Other Ambulatory Visit: Payer: Self-pay

## 2024-01-12 ENCOUNTER — Ambulatory Visit: Payer: Self-pay | Admitting: Family Medicine

## 2024-01-12 DIAGNOSIS — R21 Rash and other nonspecific skin eruption: Secondary | ICD-10-CM

## 2024-01-12 DIAGNOSIS — M2550 Pain in unspecified joint: Secondary | ICD-10-CM

## 2024-01-12 LAB — PTH, INTACT AND CALCIUM
Calcium: 9.2 mg/dL (ref 8.6–10.2)
PTH: 33 pg/mL (ref 16–77)

## 2024-01-12 MED ORDER — VITAMIN D (ERGOCALCIFEROL) 1.25 MG (50000 UNIT) PO CAPS: 50000.0000 [IU] | ORAL_CAPSULE | ORAL | 0 refills | Status: DC

## 2024-01-13 LAB — ANA: Anti Nuclear Antibody (ANA): POSITIVE — AB

## 2024-01-13 LAB — ANTI-NUCLEAR AB-TITER (ANA TITER): ANA Titer 1: 1:40 {titer} — ABNORMAL HIGH

## 2024-01-13 LAB — RHEUMATOID FACTOR: Rheumatoid fact SerPl-aCnc: <10 [IU]/mL (ref <14)

## 2024-01-13 LAB — ANGIOTENSIN CONVERTING ENZYME: Angiotensin-Converting Enzyme: 19 U/L (ref 9–67)

## 2024-01-13 LAB — CALCIUM, IONIZED: Calcium, Ion: 5.1 mg/dL (ref 4.7–5.5)

## 2024-01-13 LAB — CYCLIC CITRUL PEPTIDE ANTIBODY, IGG: Cyclic Citrullin Peptide Ab: <16 U

## 2024-01-18 ENCOUNTER — Ambulatory Visit: Payer: Self-pay

## 2024-01-18 ENCOUNTER — Ambulatory Visit
Admission: EM | Admit: 2024-01-18 | Discharge: 2024-01-18 | Disposition: A | Discharge status: Home / Self Care | Attending: Physician Assistant | Admitting: Physician Assistant

## 2024-01-18 ENCOUNTER — Other Ambulatory Visit: Payer: Self-pay | Admitting: Nurse Practitioner

## 2024-01-18 DIAGNOSIS — R42 Dizziness and giddiness: Secondary | ICD-10-CM | POA: Diagnosis not present

## 2024-01-18 DIAGNOSIS — G43909 Migraine, unspecified, not intractable, without status migrainosus: Secondary | ICD-10-CM

## 2024-01-18 DIAGNOSIS — B354 Tinea corporis: Secondary | ICD-10-CM | POA: Diagnosis not present

## 2024-01-18 LAB — POCT URINALYSIS DIP (MANUAL ENTRY)
Bilirubin, UA: NEGATIVE
Glucose, UA: NEGATIVE mg/dL
Ketones, POC UA: NEGATIVE mg/dL
Leukocytes, UA: NEGATIVE
Nitrite, UA: NEGATIVE
Spec Grav, UA: 1.025 (ref 1.010–1.025)
Urobilinogen, UA: 0.2 U/dL
pH, UA: 7 (ref 5.0–8.0)

## 2024-01-18 MED ORDER — CICLOPIROX OLAMINE 0.77 % EX CREA: TOPICAL_CREAM | Freq: Two times a day (BID) | CUTANEOUS | 0 refills | Status: AC

## 2024-01-18 MED ORDER — MECLIZINE HCL 25 MG PO TABS
25.0000 mg | ORAL_TABLET | Freq: Three times a day (TID) | ORAL | 0 refills | Status: AC | PRN
Start: 2024-01-18 — End: ?

## 2024-01-18 MED ORDER — RIZATRIPTAN BENZOATE 10 MG PO TABS: 10.0000 mg | ORAL_TABLET | ORAL | 3 refills | Status: DC | PRN

## 2024-01-18 NOTE — ED Triage Notes (Addendum)
 Pt present with c/o possible ring worm and a cough thas been going on for months. Pt states she has had X-rays done, the cough is persistent and is not going away.   Pt states she has just come off of a medication that has caused her withdrawals. Has been experiencing body aches, nausea and change in vision. Has started taking new vitamins and had to stop taking them because it was causing her urinary frequency. Reports a rash on the rt arm.

## 2024-01-18 NOTE — ED Provider Notes (Signed)
 [facility]    CSN: [id number] Arrival date & time: 01/18/24  7829      History   Chief Complaint Chief Complaint  Patient presents with   Rash   Generalized Body Aches   Nausea    HPI Caitlin Bryant is a 43 y.o. female.   Patient presents today with several concerns.  Her primary concern today is waking up feeling lightheaded and dizzy.  She does have a history of intermittent dizziness which she previously attributed to combination of seasonal allergies and withdrawing from her SNRI.  She has a history of complex regional pain syndrome and was previously on duloxetine  but is switching to gabapentin .  She has been without the duloxetine  for a week and has noticed some nightmares as well as dizziness when she woke up this morning she was feeling much worse.  She denies any fever, congestion, sore throat, urinary symptoms.  She is eating and drinking normally.  She did notice a annular rash on her left upper thigh as well as several areas over on her right lower abdomen.  These are not pruritic.  Denies any changes to personal hygiene products including soaps or detergents.  She also reports a history of persistent cough but has been evaluated for this several times in the past.  Her chest x-ray was normal and she has been started on allergy medicine which she reports compliance with.  She reports the only other change to her routine is that she started on some new vitamins (B6, B12, vitamin D ) based on blood work with her orthopedic provider and she did take these for the first time yesterday.  She denies any significant blood loss including melena or hematochezia.  She denies any chest pain, shortness of breath, palpitations.  Reports she is just feeling poorly.    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 05/24/2022   Complex regional pain syndrome i of left lower limb 05/24/2022   Obesity (BMI 35.0-39.9 without comorbidity) 06/25/2021   Disorder of lumbosacral intervertebral disc 06/15/2021   Chronic pain of left ankle 06/08/2021   Routine general medical examination at a health care facility 03/17/2021   Other fatigue 02/23/2021   Acute bilateral low back pain without sciatica 06/09/2019   Left knee pain 04/20/2018   Impacted cerumen of right ear 04/15/2017   Neck swelling 08/07/2016   Depression 07/24/2015   TMJ dysfunction 07/03/2015   Migraine 05/10/2015   Ankle instability, left 02/22/2015   Generalized anxiety disorder 01/31/2015   Allergic rhinitis 01/31/2015    Past Surgical History:  Procedure Laterality Date   ANKLE ARTHROSCOPY WITH RECONSTRUCTION Left 08/19/2021   Procedure: LEFT ANKLE ARTHROSCOPY, ARTHROSCOPIC TREATMENT OF TALUS OSTEOCHONDRAL LESION, LATERAL LIGAMENT RECONSTRUCTION, PERONEAL TENDON DEBRIDEMENT, REPAIR OF DISLOCATING PERONEAL TENDONS;  Surgeon: Donnamarie Gables, MD;  Location: [facility];  Service: Orthopedics;  Laterality: Left;  LENGTH OF SURGERY: 120 MINUTES   CESAREAN SECTION  2004 and 2008   x 2   DIAGNOSTIC LAPAROSCOPY     TONSILLECTOMY      OB History  No obstetric history on file.      Home Medications    Prior to Admission medications   Medication Sig Start Date End Date Taking? Authorizing Provider  ciclopirox (LOPROX) 0.77 % cream Apply topically 2 (two) times daily. 01/18/24  Yes Yadir Zentner K, PA-C  meclizine (ANTIVERT) 25 MG tablet Take 1 tablet (25 mg total) by mouth 3 (three) times daily as needed for dizziness. 01/18/24  Yes Taralynn Quiett K, PA-C  azelastine  (ASTELIN ) 0.1 % nasal spray USE 2 SPRAYS IN EACH  NOSTRIL TWICE DAILY 08/24/23   Laneta Pintos, MD  cetirizine  (ZYRTEC ) 10 MG tablet TAKE 1 TABLET BY MOUTH DAILY 01/05/24   Laneta Pintos, MD  DULoxetine  (CYMBALTA ) 20 MG capsule Take 1 capsule (20 mg total) by mouth daily. 11/08/23 11/07/24  Lylia Sand, MD  fluticasone  (FLONASE ) 50 MCG/ACT nasal spray Place into both nostrils daily.    [provider]  gabapentin  (NEURONTIN ) 100 MG capsule TAKE 1 CAPSULE BY MOUTH DAILY AT BEDTIME AS NEEDED 12/28/23   Lylia Sand, MD  [name] 1/20 1-20 MG-MCG tablet Take 1 tablet by mouth daily. 09/08/22   [provider]  ketorolac  (TORADOL ) 10 MG tablet Take 1 tablet (10 mg total) by mouth every 6 (six) hours as needed (pain). 03/12/23   Ann Keto, MD  montelukast  (SINGULAIR ) 10 MG tablet TAKE 1 TABLET BY MOUTH AT BEDTIME 11/23/23   Laneta Pintos, MD  ondansetron  (ZOFRAN  ODT) 4 MG disintegrating tablet Take 1-2 tablets (4-8 mg total) by mouth every 8 (eight) hours as needed for nausea or vomiting. 02/03/23   Sharyon Deis, NP  rizatriptan  (MAXALT ) 10 MG tablet Take 1 tablet (10 mg total) by mouth as needed for migraine. May repeat in 2 hours if needed 01/18/24   Laneta Pintos, MD  tiZANidine  (ZANAFLEX ) 4 MG tablet Take 1 tablet (4 mg total) by mouth every 8 (eight) hours as needed for muscle spasms. 03/12/23   Banister, Pamela K, MD  Vitamin D, Ergocalciferol, (DRISDOL) 1.25 MG (50000 UNIT) CAPS capsule Take 1 capsule (50,000 Units total) by mouth every 7 (seven) days. 01/12/24   Isidro Margo, DO    Family History Family History  Problem Relation Age of Onset   Hypertension Mother    Diabetes Mother    Osteoarthritis Mother    Fibromyalgia Mother    Heart attack Father        Defibrillator    Social History Social History   Tobacco Use   Smoking status: Never   Smokeless tobacco: Never  Vaping Use   Vaping status: Never Used  Substance Use Topics   Alcohol use: Yes    Comment: 1 a month or less   Drug  use: No     Allergies   Prazosin , Sumatriptan , and Sulfa antibiotics   Review of Systems Review of Systems  Constitutional:  Positive for activity change. Negative for appetite change, fatigue and fever.  HENT:  Positive for rhinorrhea (Chronic related to allergies). Negative for congestion, sinus pressure, sneezing and sore throat.   Eyes:  Negative for visual disturbance.  Respiratory:  Positive for cough (Chronic). Negative for shortness of breath.   Cardiovascular:  Negative for chest pain and palpitations.  Gastrointestinal:  Positive for nausea. Negative for abdominal pain, diarrhea and vomiting.  Neurological:  Positive for dizziness, weakness (Chronically related to CRPS), light-headedness and headaches (Chronic and mild). Negative for syncope and numbness.     Physical Exam Triage Vital Signs  ED Triage Vitals  Encounter Vitals Group     BP 01/18/24 0948 121/86     Systolic BP Percentile --      Diastolic BP Percentile --      Pulse Rate 01/18/24 0948 89     Resp 01/18/24 0948 18     Temp 01/18/24 0948 97.9 F (36.6 C)     Temp Source 01/18/24 0948 Oral     SpO2 01/18/24 0948 97 %     Weight --      Height --      Head Circumference --      Peak Flow --      Pain Score 01/18/24 0945 0     Pain Loc --      Pain Education --      Exclude from Growth Chart --    No data found.  Updated Vital Signs BP 120/82 (BP Location: Left Arm)   Pulse 89   Temp 97.9 F (36.6 C) (Oral)   Resp 18   LMP 12/21/2023 (Exact Date)   SpO2 97%   Visual Acuity Right Eye Distance:   Left Eye Distance:   Bilateral Distance:    Right Eye Near:   Left Eye Near:    Bilateral Near:     Physical Exam Vitals reviewed.  Constitutional:      General: She is awake. She is not in acute distress.    Appearance: Normal appearance. She is well-developed. She is not ill-appearing.     Comments: Very pleasant female appears stated age in no acute distress sitting comfortably in exam  room  HENT:     Head: Normocephalic and atraumatic. No raccoon eyes, Battle's sign or contusion.     Right Ear: Tympanic membrane, ear canal and external ear normal. No hemotympanum.     Left Ear: Tympanic membrane, ear canal and external ear normal. No hemotympanum.     Nose: Nose normal.     Mouth/Throat:     Tongue: Tongue does not deviate from midline.     Pharynx: Uvula midline. No oropharyngeal exudate or posterior oropharyngeal erythema.  Eyes:     Extraocular Movements: Extraocular movements intact.     Pupils: Pupils are equal, round, and reactive to light.  Cardiovascular:     Rate and Rhythm: Normal rate and regular rhythm.     Heart sounds: Normal heart sounds, S1 normal and S2 normal. No murmur heard. Pulmonary:     Effort: Pulmonary effort is normal.     Breath sounds: Normal breath sounds. No wheezing, rhonchi or rales.     Comments: Clear to auscultation bilaterally Musculoskeletal:     Cervical back: No spinous process tenderness or muscular tenderness.  Skin:    Findings: Rash present.          Comments: Multiple maculopapular lesions without excoriation noted superior to right hip and along lower abdomen.  Annular rash with hyperpigmentation of border noted left anterior thigh measuring approximately 3 cm x 2 cm.  Neurological:     General: No focal deficit present.     Mental Status: She is alert and oriented to person, place, and time.     Cranial Nerves: Cranial nerves 2-12 are intact.     Motor: Weakness present.     Coordination: Coordination is intact.     Gait: Gait is intact.     Comments: Cranial nerves II through XII grossly intact.  Strength 4/5 left upper and lower extremity that patient reports is chronic.  Psychiatric:        Behavior: Behavior is cooperative.      UC Treatments / Results  Labs (all labs ordered are listed, but only abnormal results are displayed) Labs Reviewed  POCT URINALYSIS DIP (MANUAL ENTRY) - Abnormal; Notable for the  following components:      Result Value   Blood, UA small (*)    Protein Ur, POC trace (*)    All other components within normal limits    EKG   Radiology No results found.  Procedures Procedures (including critical care time)  Medications Ordered in UC Medications - No data to display  Initial Impression / Assessment and Plan / UC Course  I have reviewed the triage vital signs and the nursing notes.  Pertinent labs & imaging results that were available during my care of the patient were reviewed by me and considered in my medical decision making (see chart for details).     Patient is well-appearing, afebrile, nontoxic, nontachycardic.  Physical exam is reassuring; she did have some weakness on the left side of her body but reports that this is chronic and unchanged from baseline related to her CRPS.  Otherwise her neurological exam is normal.  EKG was obtained that showed normal sinus rhythm with ventricular rate of 86 bpm without ischemic changes; no previous to compare.  Orthostatic vital signs were appropriate.  UA had trace blood and protein but this was chronic dating back at least 4 years.  No evidence of dehydration.  She did describe this primarily as a dizziness/room spinning sensation and so was given meclizine to see if this would help manage her symptoms.  She was also encouraged to increase the amount of salt in her diet and drink plenty of fluids.  We discussed that most likely her symptoms are related to ongoing withdrawal from duloxetine  recommend close follow-up with her primary care provider.  We did not elect to do blood work as she had had normal CBC/CMP about 1 week ago.  Discussed that if she has persistent or worsening symptoms she needs to be seen immediately.  Strict return precautions given.  She declined work excuse note.  Unclear etiology of rash but given annular appearance with hyperpigmentation of border concern for tinea corporis.  Will start Loprox twice  daily for minimum of 2 weeks.  She was encouraged to use hypoallergenic soaps and detergents.  We discussed that if the rash spreads or changes in any way she should return for reevaluation.  Recommended close follow-up with primary care.  Final Clinical Impressions(s) / UC Diagnoses   Final diagnoses:  Dizziness  Tinea corporis     Discharge Instructions      Your EKG was normal.  Your urine had a little bit of blood and protein but this is similar to 9 months ago.  Make sure you are drinking plenty of fluid.  I have called in a medicine called meclizine to help with the dizziness.  I believe your symptoms are related to coming off of the duloxetine .  If anything worsens or changes and you have episodes of passing out, weakness, constant dizziness you need to be seen immediately.  Follow-up with your primary care soon as possible.  We are treating you for ringworm.  Apply Loprox twice daily.  Use hypoallergenic soaps and detergents.  If this spreads or changes please return for reevaluation.   ED Prescriptions     Medication Sig Dispense Auth. Provider   ciclopirox (LOPROX) 0.77 % cream  Apply topically 2 (two) times daily. 30 g Jayne Peckenpaugh K, PA-C   meclizine (ANTIVERT) 25 MG tablet Take 1 tablet (25 mg total) by mouth 3 (three) times daily as needed for dizziness. 30 tablet Jimena Wieczorek K, PA-C      PDMP not reviewed this encounter.   Budd Cargo, PA-C 01/18/24 1129

## 2024-01-18 NOTE — Discharge Instructions (Signed)
 Your EKG was normal.  Your urine had a little bit of blood and protein but this is similar to 9 months ago.  Make sure you are drinking plenty of fluid.  I have called in a medicine called meclizine to help with the dizziness.  I believe your symptoms are related to coming off of the duloxetine .  If anything worsens or changes and you have episodes of passing out, weakness, constant dizziness you need to be seen immediately.  Follow-up with your primary care soon as possible.  We are treating you for ringworm.  Apply Loprox twice daily.  Use hypoallergenic soaps and detergents.  If this spreads or changes please return for reevaluation.

## 2024-01-18 NOTE — Telephone Encounter (Signed)
 Copied from CRM [id number]. Topic: Clinical - Red Word Triage >> Jan 18, 2024  8:04 AM Marissa P wrote: Red Word that prompted transfer to Nurse Triage: Patient called aching hurting all over rash right side of stomach, upper thigh area. Tried treating it herself and hasn't gotten better, as well as a bad cough for a few months as well. Vision feels blurred as well, no headache. Would like to be seen today as well.   Chief Complaint: medication reaction Symptoms: body aches, rash Frequency: constant Pertinent Negatives: Patient denies shortness of breath  Disposition: [] ED /[] Urgent Care (no appt availability in office) / [] Appointment(In office/virtual)/ []  [facility]/ [] Home Care/ [] Refused Recommended Disposition /[] [facility]/ []  Follow-up with PCP Additional Notes: Started taking new vitamins on Sunday; has been weaning off of Duloxetine  since December, has been off for a week. Now endorsing body aches, rash, and blurry vision since yesterday. RN placed call to [facility], no answer. Will route message [facility] to office for f/u. RN advising patient to visit urgent care to address cough. Pt agreeable.   Reason for Disposition  [1] Caller has URGENT medicine question about med that PCP or specialist prescribed AND [2] triager unable to answer question  Answer Assessment - Initial Assessment Questions 1. NAME of MEDICINE: "What medicine(s) are you calling about?"     Duloxetine  (coming off of) Vitamin D once a week, OTC B12 and B6  2. QUESTION: "What is your question?" (e.g., double dose of medicine, side effect)     Concerning side effects  3. PRESCRIBER: "Who prescribed the medicine?" Reason: if prescribed by specialist, call should be referred to that group.     .  4. SYMPTOMS: "Do you have any symptoms?" If Yes, ask: "What symptoms are you having?"  "How bad are the symptoms (e.g., mild, moderate, severe)     Rash, body aches, blurry vision  5. PREGNANCY:  "Is there any  chance that you are pregnant?" "When was your last menstrual period?"     LMP April 10; no chance of pregnancy (per patient)  Protocols used: Medication Question Call-A-AH

## 2024-01-21 ENCOUNTER — Encounter: Attending: Physical Medicine & Rehabilitation | Admitting: Physical Medicine & Rehabilitation

## 2024-01-21 ENCOUNTER — Encounter: Payer: Self-pay | Admitting: Physical Medicine & Rehabilitation

## 2024-01-21 VITALS — BP 125/85 | HR 92 | Ht 63.0 in | Wt 209.0 lb

## 2024-01-21 DIAGNOSIS — G90522 Complex regional pain syndrome I of left lower limb: Secondary | ICD-10-CM | POA: Insufficient documentation

## 2024-01-21 DIAGNOSIS — G894 Chronic pain syndrome: Secondary | ICD-10-CM | POA: Insufficient documentation

## 2024-01-21 MED ORDER — GABAPENTIN 100 MG PO CAPS: 100.0000 mg | ORAL_CAPSULE | Freq: Every day | ORAL | 4 refills | Status: AC

## 2024-01-21 NOTE — Progress Notes (Addendum)
 Subjective:    Patient ID: Caitlin Bryant, female    DOB: 1981-08-07, 43 y.o.   MRN: [id number]  HPI HPI  Caitlin Bryant is a 43 y.o. year old female  who  has a past medical history of Anxiety, Headache, and MRSA (methicillin resistant staph aureus) culture positive.   They are presenting to PM&R clinic as a new patient for pain management evaluation. They were referred for treatment of chronic pain. She reports her pain started after her ankle tendon surgery Aug 19, 2021.  She reports she had the surgery due to frequent ankle sprains.  She had a nerve block at the time behind her knee.  She completed physical therapy after her surgery.  Since this time has has had severe pain in her left lower extremity.  Patient reports her pain began August 19, 2021 when she had left ankle tendon surgery.  Since this time.  She has had worsening pain in her left lower extremity.  Her leg is very painful from her lower left thigh down to her foot.  She previously worked as a Runner, broadcasting/film/video.  She has been much less active and has had weight gain.  She is only able to walk very short distances.  Pain is progressively getting worse.  She has numbness and tingling in her distal toes.  Light touch is a very painful around her foot and ankle.  The sensation of showering is very painful.  Her pain is worse at night.  She has had a lot of swelling in her lower extremities since this time from her knee down to her foot.  She has been followed by neurology.  She was seen by Dr. Addie who was suspecting CRPS.  She initially had an EMG that was reported to have altered sensory function however EMG was repeated by Dr. Leigh neurology and no significant abnormalities were noted.   Red flag symptoms: Patient denies saddle anesthesia, loss of bowel or bladder continence, new weakness, new numbness/tingling, or pain waking up at nighttime.   Medications tried: Gabapentin  -unsure dose, only able to take HS due to sedation, liminal  benefit for pain Ibuprofen  200-400mg  2-3 times a day     Interval History 10/01/22 She reports continued pain in her left lower extremity that continues to worsen.  She has not noted significant benefit with Lyrica  however she has been able to tolerate this better than gabapentin  thus far.  She has started physical therapy.  She reports that desensitization techniques are tolerable initially however hours later she will have severe pain in this area.  Her pain continues to cover her entire left leg.  She sometimes feels a burning sensation in her toes.  She has a lot of paresthesias in her leg as well.  She will also be trying in aquatic therapy in addition to a land-based therapy.  Occasionally her leg will become red.  Her left leg has increased swelling compared to her right leg.   Interval History 11/24/22 Patient is here for follow-up of her left lower extremity pain.  She continues to work with physical therapy.  She also has some difficulties recently due to illness with her husband as well.  She feels like her pain is largely unchanged.  Possibly with some increased ability to push to the pain per therapy notes.  She is scheduled for some more pool therapy.  She is only taking Lyrica  at night and this continues to cause her sedation.  While she does not  feel like the leg is insensate to touch, she feels that she cannot sense deep pressure as much on her left leg as she can on her right leg.  She is frustrated with her limitations.   Interval History 01/22/23 Patient is here for follow-up of her left lower extremity pain.  She reports she had 1 sympathetic block completed, provided benefit for about a day and pain was back to baseline within a week.  She has an additional injection scheduled next week.  She has not been using tramadol  because she is cautious with this medication and does not want to develop an addiction.  She reports she used half a tablet of tramadol  on one occasion.  She has not  noticed significant pain relief however she also did not have any significant side effects.  Her PCP has started her on Cymbalta  30 mg, she is not sure if this is providing significant benefit.  She reports having occasional muscle cramps in her right leg and foot.  She has noticed was much slower on her left than her right foot.   Interval History 05/20/2023 Patient is here for follow-up regarding her lower extremity pain suspected to be due to the CPRS.  She reports her pain continues to be severe and is worse from prior.  She continues with duloxetine  but does not feel like it is helping her mood or pain.  She brought her tramadol  into be destroyed because she did not feel like this was helping.  She reports she is not very active although tries to do which she can.  After activities she has a lot of swelling in her left leg.  She reports she has been diagnosed with plantar fasciitis right lower extremity.  She has been offered a spinal cord stimulator but does not want this.  She reports occasional numbness of her left great toe.  She requests letter to have her dog as a emotional support animal, she says caring for him helps with her and forces her to be more active.  Interval History 07/29/23 Patient is here for follow-up regarding chronic CPRS pain.  Her primary concern today is that she had significant withdrawal symptoms when trying to discontinue duloxetine  30 mg.  She has restarted this medication and has been afraid to go down to the 20 mg daily dose.  She has not yet started prazosin , was not available at the pharmacy.  Pain continues to be severe in her left lower extremity and largely unchanged from prior visit.  She is still interested in spinal cord stimulator.  She would like to continue using a handicap placard due to her limited ambulation distance.  Interval History 11/08/22 Patient is here for follow-up regarding her CRPS pain.   Patient has been continue to monitor wean down on  duloxetine  because she would not feel like it is helping her pain.  She recently ran out of the 20 mg dose so has been using 30 mg duloxetine .  Patient did not tolerate prazosin , and also did not help her pain.  She continues to have severe pain in her left leg.  She tries to stay active but pain limits her activities.  She also has been developing some pain and swelling in her left arm over the past few weeks.  She has been taking gabapentin  100 mg at night intermittently, higher doses caused sedation.  Interval History 01/21/24 Patient continues to have a lot of pain in her left flank, left arm and having pain in  her left face as well.  She has been following with [facility] and she reports that she has an MRI of her brain ordered for tomorrow.  They have also tried a few other interventions to help her pain.  She would like to try taking slightly higher doses of gabapentin , she has been taking 100 mg at night previously due to concerns of sedation but tolerating a little bit better.  She still is hesitant to try spinal cord stimulator and aquatic therapy limited by cost.  She has weaned off compounded low-dose Cymbalta , has been off of this for about a week.  Reports still having some withdrawal symptoms but sounds like she is able to tolerate this.  Pain Inventory Average Pain 7 Pain Right Now 7 My pain is constant, sharp, burning, dull, stabbing, tingling, and aching  In the last 24 hours, has pain interfered with the following? General activity 8 Relation with others 8 Enjoyment of life 10 What TIME of day is your pain at its worst? morning , daytime, evening, and night Sleep (in general) Poor  Pain is worse with: walking, bending, sitting, inactivity, standing, and some activites Pain improves with: rest and nothing Relief from Meds: 3  Family History  Problem Relation Age of Onset   Hypertension Mother    Diabetes Mother    Osteoarthritis Mother     Fibromyalgia Mother    Heart attack Father        Defibrillator   Social History   Socioeconomic History   Marital status: Married    Spouse name: Not on file   Number of children: Not on file   Years of education: Not on file   Highest education level: Not on file  Occupational History   Not on file  Tobacco Use   Smoking status: Never   Smokeless tobacco: Never  Vaping Use   Vaping status: Never Used  Substance and Sexual Activity   Alcohol use: Yes    Comment: 1 a month or less   Drug use: No   Sexual activity: Yes    Partners: Male  Other Topics Concern   Not on file  Social History Narrative   Are you right handed or left handed? Right   Are you currently employed ? no      Do you live at home alone?husband and 2 daughters   Caffeine  none   What type of home do you live in: 1 story or 2 story? One with stairs inside       Social Drivers of Health   Financial Resource Strain: Not on file  Food Insecurity: Not on file  Transportation Needs: Not on file  Physical Activity: Not on file  Stress: Not on file  Social Connections: Not on file   Past Surgical History:  Procedure Laterality Date   ANKLE ARTHROSCOPY WITH RECONSTRUCTION Left 08/19/2021   Procedure: LEFT ANKLE ARTHROSCOPY, ARTHROSCOPIC TREATMENT OF TALUS OSTEOCHONDRAL LESION, LATERAL LIGAMENT RECONSTRUCTION, PERONEAL TENDON DEBRIDEMENT, REPAIR OF DISLOCATING PERONEAL TENDONS;  Surgeon: Elsa Lonni SAUNDERS, MD;  Location: [facility];  Service: Orthopedics;  Laterality: Left;  LENGTH OF SURGERY: 120 MINUTES   CESAREAN SECTION  2004 and 2008   x 2   DIAGNOSTIC LAPAROSCOPY     TONSILLECTOMY     Past Surgical History:  Procedure Laterality Date   ANKLE ARTHROSCOPY WITH RECONSTRUCTION Left 08/19/2021   Procedure: LEFT ANKLE ARTHROSCOPY, ARTHROSCOPIC TREATMENT OF TALUS OSTEOCHONDRAL LESION, LATERAL LIGAMENT RECONSTRUCTION, PERONEAL TENDON DEBRIDEMENT, REPAIR  OF DISLOCATING PERONEAL TENDONS;   Surgeon: Elsa Lonni SAUNDERS, MD;  Location: [facility];  Service: Orthopedics;  Laterality: Left;  LENGTH OF SURGERY: 120 MINUTES   CESAREAN SECTION  2004 and 2008   x 2   DIAGNOSTIC LAPAROSCOPY     TONSILLECTOMY     Past Medical History:  Diagnosis Date   Anxiety    Headache    MRSA (methicillin resistant staph aureus) culture positive    5 or more years ago, right thumb   BP 125/85   Pulse 92   Ht 5' 3 (1.6 m)   Wt 209 lb (94.8 kg)   LMP 12/21/2023 (Exact Date)   SpO2 97%   BMI 37.02 kg/m   Opioid Risk Score:   Fall Risk Score:  `1  Depression screen [facility] 2/9     01/21/2024    3:37 PM 11/08/2023    3:17 PM 10/06/2023    2:33 PM 07/29/2023    2:12 PM 07/13/2023    4:02 PM 05/20/2023    2:36 PM 05/05/2023   11:01 AM  Depression screen PHQ 2/9  Decreased Interest 1 1 2 1 2  0 2  Down, Depressed, Hopeless 1 1 2 1 2  0 1  PHQ - 2 Score 2 2 4 2 4  0 3  Altered sleeping   2  2  2   Tired, decreased energy   2  2  1   Change in appetite   2  2  1   Feeling bad or failure about yourself    2  1  1   Trouble concentrating   2  2  2   Moving slowly or fidgety/restless   2  1  1   Suicidal thoughts   0  0  0  PHQ-9 Score   16  14  11   Difficult doing work/chores   Very difficult  Somewhat difficult  Somewhat difficult      Review of Systems  HENT:         Left side facial pain  Musculoskeletal:  Positive for gait problem.       Pain in both feet, left hip pain down to left foot, pain in left shoulder  Pain on the right-side also in the right arm, right chest down to right foot.  All other systems reviewed and are negative.      Objective:   Physical Exam    01/21/2024    3:27 PM 01/18/2024   10:46 AM 01/18/2024    9:48 AM  Vitals with BMI  Height 5' 3    Weight 209 lbs    BMI 37.03    Systolic 125 120 878  Diastolic 85 82 86  Pulse 92 -- 89     Gen: no distress, normal appearing HEENT: oral mucosa pink and moist, NCAT Chest: normal effort,  normal rate of breathing Abd: soft, non-distended Psych: pleasant, normal affect Skin: intact Neuro: CN 2-12 grossly intact, follows commands,  Strength bilateral upper extremities 5/5 throughout Strength bilateral lower extremities at least 4/5 limited by pain Sensation intact light touch in all 4 extremities however she reports his altered throughout L leg Antalgic gait Musculoskeletal  TTP in her left arm and leg to light touch     Assessment & Plan:  CPRS LLE pain after L ankle tendon surgery 2022-she also appears to be developing symptoms over her left upper extremity also -Normal EMG LLE 08/10/22 -Agree with neurology and orthopedics, suspect CRPS type 1 -She reports poor benefit  with Lyrica , duloxetine , tramadol , ibuprofen , Prazosin  -Completed PT previously without great benefit -She reports poor results with sympathetic blocks x 2 -ORT mod -Pain agreement and UDS completed prior visit - Previously ordered 15 mg Cymbalta  dose to help wean down, patient discontinued this a week ago --Consider amitriptyline or nortriptyline  -Discontinue prazosin  for CPRS, 1 mg twice daily-added to allergy list -Spinal cord stimulator-she is still considering -Consider low-dose opioid medication however avoid escalation, discussed with patient and she would like to hold off at this time -MRI ordered by Dr. Darlis neurosurgery and spine -does not appear to show cause of her pain -Could consider retrying PT-I think aquatic therapy at [place] would be a good option-she would like to hold off for now -She could try doing self exercises in the pool - Referred to Dr. Hayden for pain psychology treatment-discussed patient with him briefly - Okay to try to increase gabapentin  100 mg to 2 tabs at night, if tolerating and not causing too much sedation she can try 3 tabs.  She also can retry spreading out the 2-3 100 mg tabs throughout the day as long as is not causing too much sedation - Patient  following with Dr. Arthea Sharps, she says she has MRI of the head ordered and other exercises and treatments being worked on  Muscle cramps -Discussed trying magnesium supplement prior visit  Plantar fasciitis -Heel stretch, arch supporting shoe insoles  Patient asked about scrambler therapy-I am not too familiar with this but will try to look into this   Ambulatory referral to aquatic therapy 03/22/24

## 2024-01-22 ENCOUNTER — Ambulatory Visit
Admission: RE | Admit: 2024-01-22 | Discharge: 2024-01-22 | Disposition: A | Discharge status: Home / Self Care | Source: Ambulatory Visit | Attending: Family Medicine | Admitting: Family Medicine

## 2024-01-22 DIAGNOSIS — R200 Anesthesia of skin: Secondary | ICD-10-CM

## 2024-01-22 DIAGNOSIS — J3489 Other specified disorders of nose and nasal sinuses: Secondary | ICD-10-CM | POA: Diagnosis not present

## 2024-01-22 DIAGNOSIS — R202 Paresthesia of skin: Secondary | ICD-10-CM | POA: Diagnosis not present

## 2024-01-24 ENCOUNTER — Other Ambulatory Visit: Payer: Self-pay | Admitting: Family Medicine

## 2024-01-24 DIAGNOSIS — J3089 Other allergic rhinitis: Secondary | ICD-10-CM

## 2024-02-02 ENCOUNTER — Other Ambulatory Visit: Payer: Self-pay

## 2024-02-02 NOTE — Addendum Note (Signed)
 Addended by: Almeda Aris on: 02/02/2024 08:18 AM   Modules accepted: Orders

## 2024-02-09 ENCOUNTER — Ambulatory Visit: Payer: 59 | Admitting: Family Medicine

## 2024-02-28 ENCOUNTER — Other Ambulatory Visit: Payer: Self-pay

## 2024-02-28 DIAGNOSIS — R768 Other specified abnormal immunological findings in serum: Secondary | ICD-10-CM

## 2024-03-14 NOTE — Progress Notes (Unsigned)
 [facility] [address] [place] [address] Phone: [phone] Subjective:   Caitlin Bryant, am serving as a scribe for Dr. Arthea Claudene.  I'm seeing this patient by the request  of:  Caitlin Toribio POUR, MD  CC: Left-sided pain  Caitlin Bryant  Caitlin Bryant is a 43 y.o. female coming in with complaint of CPRS of left side. Patient awaiting rheumatology appt. Patient states that she continues to have pain in L sided. Believes that she had a flare up since last visit.   Would like xray of R foot and knee. Feels like she is getting CRPS in this leg as well. Having sharp pain in toes, heel, R knee and R ankle. Also gets tingling in the hands and fingers.   Patient also mentions chronic coughing since last visit.    Reviewing patient's chart since we have seen her was seen in urgent care for dizziness.  Also seeing PMR for her CRPS type I.  Recently discontinued the Cymbalta .  Discontinued the prazosin  she is still considering the spinal cord stimulator.  We did order an MRI of the brain was completely unremarkable.    Past Medical History:  Diagnosis Date   Anxiety    Headache    MRSA (methicillin resistant staph aureus) culture positive    5 or more years ago, right thumb   Past Surgical History:  Procedure Laterality Date   ANKLE ARTHROSCOPY WITH RECONSTRUCTION Left 08/19/2021   Procedure: LEFT ANKLE ARTHROSCOPY, ARTHROSCOPIC TREATMENT OF TALUS OSTEOCHONDRAL LESION, LATERAL LIGAMENT RECONSTRUCTION, PERONEAL TENDON DEBRIDEMENT, REPAIR OF DISLOCATING PERONEAL TENDONS;  Surgeon: Caitlin Lonni SAUNDERS, MD;  Location: [facility];  Service: Orthopedics;  Laterality: Left;  LENGTH OF SURGERY: 120 MINUTES   CESAREAN SECTION  2004 and 2008   x 2   DIAGNOSTIC LAPAROSCOPY     TONSILLECTOMY     Social History   Socioeconomic History   Marital status: Married    Spouse name: Not on file   Number of children: Not on file   Years  of education: Not on file   Highest education level: Not on file  Occupational History   Not on file  Tobacco Use   Smoking status: Never   Smokeless tobacco: Never  Vaping Use   Vaping status: Never Used  Substance and Sexual Activity   Alcohol use: Yes    Comment: 1 a month or less   Drug use: No   Sexual activity: Yes    Partners: Male  Other Topics Concern   Not on file  Social History Narrative   Are you right handed or left handed? Right   Are you currently employed ? no      Do you live at home alone?husband and 2 daughters   Caffeine  none   What type of home do you live in: 1 story or 2 story? One with stairs inside       Social Drivers of Health   Financial Resource Strain: Not on file  Food Insecurity: Not on file  Transportation Needs: Not on file  Physical Activity: Not on file  Stress: Not on file  Social Connections: Not on file   Allergies  Allergen Reactions   Prazosin  Other (See Comments)   Sumatriptan  Nausea And Vomiting    Severe nausea and vomiting  Other Reaction(s): GI Intolerance   Sulfa Antibiotics Rash   Family History  Problem Relation Age of Onset   Hypertension Mother  Diabetes Mother    Osteoarthritis Mother    Fibromyalgia Mother    Heart attack Father        Defibrillator    Current Outpatient Medications (Endocrine & Metabolic):    Caitlin Bryant 1/20 1-20 MG-MCG tablet, Take 1 tablet by mouth daily.   Current Outpatient Medications (Respiratory):    azelastine  (ASTELIN ) 0.1 % nasal spray, USE 2 SPRAYS IN EACH NOSTRIL TWICE DAILY   cetirizine  (ZYRTEC ) 10 MG tablet, TAKE 1 TABLET BY MOUTH DAILY   fluticasone  (FLONASE ) 50 MCG/ACT nasal spray, Place into both nostrils daily.   montelukast  (SINGULAIR ) 10 MG tablet, TAKE 1 TABLET BY MOUTH AT BEDTIME  Current Outpatient Medications (Analgesics):    ketorolac  (TORADOL ) 10 MG tablet, Take 1 tablet (10 mg total) by mouth every 6 (six) hours as needed (pain).   rizatriptan  (MAXALT )  10 MG tablet, Take 1 tablet (10 mg total) by mouth as needed for migraine. May repeat in 2 hours if needed   Current Outpatient Medications (Other):    ciclopirox  (LOPROX ) 0.77 % cream, Apply topically 2 (two) times daily.   DULoxetine  (CYMBALTA ) 20 MG capsule, Take 1 capsule (20 mg total) by mouth daily.   gabapentin  (NEURONTIN ) 100 MG capsule, Take 1-3 capsules (100-300 mg total) by mouth at bedtime.   meclizine  (ANTIVERT ) 25 MG tablet, Take 1 tablet (25 mg total) by mouth 3 (three) times daily as needed for dizziness.   ondansetron  (ZOFRAN  ODT) 4 MG disintegrating tablet, Take 1-2 tablets (4-8 mg total) by mouth every 8 (eight) hours as needed for nausea or vomiting.   tiZANidine  (ZANAFLEX ) 4 MG tablet, Take 1 tablet (4 mg total) by mouth every 8 (eight) hours as needed for muscle spasms.   Vitamin D , Ergocalciferol , (DRISDOL ) 1.25 MG (50000 UNIT) CAPS capsule, Take 1 capsule (50,000 Units total) by mouth every 7 (seven) days.   Reviewed prior external information including notes and imaging from  primary care provider As well as notes that were available from care everywhere and other healthcare systems.  Past medical history, social, surgical and family history all reviewed in electronic medical record.  No pertanent information unless stated regarding to the chief complaint.   Review of Systems:  No headache, visual changes, nausea, vomiting, diarrhea, constipation, dizziness, abdominal pain, skin rash, fevers, chills, night sweats, weight loss, swollen lymph nodes, , chest pain, shortness of breath, mood changes. POSITIVE muscle aches, body aches, joint swelling  Objective  Blood pressure 120/84, pulse 87, height 5' 3 (1.6 m), weight 202 lb (91.6 kg), SpO2 99%.   General: No apparent distress alert and oriented x3 mood and affect normal, dressed appropriately.  HEENT: Pupils equal, extraocular movements intact  Respiratory: Patient's speak in full sentences and does not appear  short of breath  Cardiovascular: No lower extremity edema, non tender, no erythema  Ankle exam shows some tenderness over the peroneal tendons bilaterally.  Mild breakdown of the longitudinal arch of the feet bilaterally.  Ankles do not show any significant instability other than the audible popping with range of motion of the peroneal tendons.  No swelling of the ankle joint itself. Patient's knees trace effusion noted to the patellofemoral joint with mild lateral tracking noted.    Impression and Recommendations:    The above documentation has been reviewed and is accurate and complete Caitlin Bryant M Tierre Netto, DO

## 2024-03-15 ENCOUNTER — Ambulatory Visit (INDEPENDENT_AMBULATORY_CARE_PROVIDER_SITE_OTHER)

## 2024-03-15 ENCOUNTER — Ambulatory Visit: Payer: Self-pay | Admitting: Family Medicine

## 2024-03-15 ENCOUNTER — Other Ambulatory Visit: Payer: Self-pay

## 2024-03-15 ENCOUNTER — Ambulatory Visit: Admitting: Family Medicine

## 2024-03-15 VITALS — BP 120/84 | HR 87 | Ht 63.0 in | Wt 202.0 lb

## 2024-03-15 DIAGNOSIS — M25561 Pain in right knee: Secondary | ICD-10-CM | POA: Diagnosis not present

## 2024-03-15 DIAGNOSIS — E538 Deficiency of other specified B group vitamins: Secondary | ICD-10-CM

## 2024-03-15 DIAGNOSIS — M2550 Pain in unspecified joint: Secondary | ICD-10-CM | POA: Diagnosis not present

## 2024-03-15 DIAGNOSIS — M25571 Pain in right ankle and joints of right foot: Secondary | ICD-10-CM | POA: Diagnosis not present

## 2024-03-15 DIAGNOSIS — N200 Calculus of kidney: Secondary | ICD-10-CM

## 2024-03-15 DIAGNOSIS — R053 Chronic cough: Secondary | ICD-10-CM

## 2024-03-15 DIAGNOSIS — G90522 Complex regional pain syndrome I of left lower limb: Secondary | ICD-10-CM | POA: Diagnosis not present

## 2024-03-15 LAB — URINALYSIS, ROUTINE W REFLEX MICROSCOPIC
Bilirubin Urine: NEGATIVE
Leukocytes,Ua: NEGATIVE
Nitrite: NEGATIVE
Specific Gravity, Urine: >=1.03 — AB (ref 1.000–1.030)
Urine Glucose: NEGATIVE
Urobilinogen, UA: 0.2 (ref 0.0–1.0)
pH: 6 (ref 5.0–8.0)

## 2024-03-15 MED ORDER — CYANOCOBALAMIN 1000 MCG/ML IJ SOLN
1000.0000 ug | Freq: Once | INTRAMUSCULAR | Status: AC
  Administered 2024-03-15: 1000 ug via INTRAMUSCULAR

## 2024-03-15 NOTE — Assessment & Plan Note (Signed)
 Patient is send diagnosis with complex regional pain syndrome type I.  Is seen multiple different providers for this problem.  Seeing pain management where she is getting most couple different medications and treatments for this.  Has had difficulty controlling it completely.  Patient feels that the pain does affect daily activities including her going to places such as the science center, store, or the doing things with the family secondary to difficulty with walking and increasing pain with the activity and thereafter.  Patient even has some difficulty with activities of daily living.  Intermittently has swelling and did show a picture of what appears to be more of a perennial tendon subluxation of the lower extremity or the possibility of even some type of tightness in the area.  Discussed with patient with laboratory workup was found to have B12 deficiency that could give her some of the problem but do not think it would with the longevity of her symptoms but I do feel it is worth giving an injection as well as starting oral supplementation.  Has had chronic cough for some time and will check if mycoplasma and more joint pain is contributing.  Refer patient to pulmonology.  Patient brought up the idea of the disability aspect of her pain and would need to talk to other physicians for this.

## 2024-03-15 NOTE — Patient Instructions (Addendum)
 Xray today B12 injection today Take B12 1000mcg daily Vit C 1000mg  daily Labs today Referral to pulmonology for chronic cough See me again in 2-3 months but do not know if this will make a difference

## 2024-03-15 NOTE — Assessment & Plan Note (Signed)
 Referral to pulmonology.  Possible GI versus pulmonary in nature.  Will refer for further workup

## 2024-03-16 ENCOUNTER — Encounter: Payer: Self-pay | Admitting: Pulmonary Disease

## 2024-03-16 ENCOUNTER — Ambulatory Visit: Admitting: Pulmonary Disease

## 2024-03-16 VITALS — BP 124/80 | HR 76 | Temp 97.1°F | Ht 63.0 in | Wt 205.2 lb

## 2024-03-16 DIAGNOSIS — H6593 Unspecified nonsuppurative otitis media, bilateral: Secondary | ICD-10-CM | POA: Diagnosis not present

## 2024-03-16 DIAGNOSIS — R0602 Shortness of breath: Secondary | ICD-10-CM

## 2024-03-16 DIAGNOSIS — R053 Chronic cough: Secondary | ICD-10-CM

## 2024-03-16 DIAGNOSIS — R0982 Postnasal drip: Secondary | ICD-10-CM

## 2024-03-16 DIAGNOSIS — G90522 Complex regional pain syndrome I of left lower limb: Secondary | ICD-10-CM

## 2024-03-16 DIAGNOSIS — R768 Other specified abnormal immunological findings in serum: Secondary | ICD-10-CM

## 2024-03-16 LAB — NITRIC OXIDE: Nitric Oxide: 17

## 2024-03-16 MED ORDER — QVAR REDIHALER 80 MCG/ACT IN AERB: 2.0000 | INHALATION_SPRAY | Freq: Two times a day (BID) | RESPIRATORY_TRACT | 2 refills | Status: AC

## 2024-03-16 NOTE — Progress Notes (Signed)
 Subjective:    Patient ID: Caitlin Bryant, female    DOB: 05-26-1981, 43 y.o.   MRN: [id number]  Patient Care Team: Chandra Toribio POUR, MD as PCP - [facility])  Chief Complaint  Patient presents with   Consult    Dry cough since April. SOB. No wheezing.     BACKGROUND: Patient is a 43 year old lifelong never smoker who presents for evaluation of a dry nonproductive cough present since April 2025.  She is kindly referred by Dr. Arthea Sharps her primary care physician is Dr. Toribio Chandra.  HPI Discussed the use of AI scribe software for clinical note transcription with the patient, who gave verbal consent to proceed.  History of Present Illness   Caitlin Bryant is a 43 year old female who presents with a chronic cough since April.  She is accompanied by her husband, Ozell.  She has experienced a persistent cough since April, described as a 'slight cough' that occurs both while sitting and moving. She is uncertain if anxiety contributes to it. After having COVID-19 last summer, the cough would occasionally reappear, but since April, it has been persistent. She has tried over-the-counter medications for allergies, suspecting they might be related to her symptoms.  No fever, chills, chest pain, or heartburn. Slight shortness of breath when lying down, accompanied by coughing until she settles into a new position. She has a history of sinus issues, including frequent post-nasal drip. She denies smoking but was exposed to secondhand smoke during childhood as both her parents and sister smoked in the house. She denies any reflux symptoms and does not produce sputum when coughing.  Her past medical history includes ankle surgery a couple of years ago, resulting in complex regional pain syndrome (CRPS) that has spread. She is currently not working and stays at home due to this condition. She mentions a positive ANA test and has an upcoming appointment with a rheumatologist to  further investigate this.  The ANA had a speckled pattern, however low titer.  She experiences facial swelling and a rash that feels like 'pins and needles or being stung,' primarily on one side of her face.     She does not have any significant occupational exposure.  Chest x-ray performed in May was independently reviewed and shows no evidence of active disease.  An MRI of the brain obtained in May showed some mild left ethmoid sinus disease.  It also showed deviated nasal septum to the left.  DATA 01/10/2024 CXR PA and lateral: No active cardiopulmonary disease.  Perhaps some mild coarsened interstitial markings 01/22/2024 MRI brain: No acute abnormalities, there is mild left ethmoid sinus disease and deviated nasal septum to the left.  Review of Systems A 10 point review of systems was performed and it is as noted above otherwise negative.   Past Medical History:  Diagnosis Date   Anxiety    Headache    MRSA (methicillin resistant staph aureus) culture positive    5 or more years ago, right thumb    Past Surgical History:  Procedure Laterality Date   ANKLE ARTHROSCOPY WITH RECONSTRUCTION Left 08/19/2021   Procedure: LEFT ANKLE ARTHROSCOPY, ARTHROSCOPIC TREATMENT OF TALUS OSTEOCHONDRAL LESION, LATERAL LIGAMENT RECONSTRUCTION, PERONEAL TENDON DEBRIDEMENT, REPAIR OF DISLOCATING PERONEAL TENDONS;  Surgeon: Elsa Lonni SAUNDERS, MD;  Location: [facility];  Service: Orthopedics;  Laterality: Left;  LENGTH OF SURGERY: 120 MINUTES   CESAREAN SECTION  2004 and 2008   x 2   DIAGNOSTIC LAPAROSCOPY  TONSILLECTOMY      Patient Active Problem List   Diagnosis Date Noted   Chronic cough 03/15/2024   Seborrheic keratosis 07/13/2023   Fibrous papule of face 07/13/2023   Plantar wart 07/13/2023   Plantar fasciitis, right 05/05/2023   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 05/24/2022   Complex regional pain syndrome i of left lower limb 05/24/2022   Obesity  (BMI 35.0-39.9 without comorbidity) 06/25/2021   Disorder of lumbosacral intervertebral disc 06/15/2021   Chronic pain of left ankle 06/08/2021   Routine general medical examination at a health care facility 03/17/2021   Other fatigue 02/23/2021   Acute bilateral low back pain without sciatica 06/09/2019   Left knee pain 04/20/2018   Impacted cerumen of right ear 04/15/2017   Neck swelling 08/07/2016   Depression 07/24/2015   TMJ dysfunction 07/03/2015   Migraine 05/10/2015   Ankle instability, left 02/22/2015   Generalized anxiety disorder 01/31/2015   Allergic rhinitis 01/31/2015    Family History  Problem Relation Age of Onset   Hypertension Mother    Diabetes Mother    Osteoarthritis Mother    Fibromyalgia Mother    Heart attack Father        Defibrillator    Social History   Tobacco Use   Smoking status: Never   Smokeless tobacco: Never  Substance Use Topics   Alcohol use: Yes    Comment: 1 a month or less    Allergies  Allergen Reactions   Prazosin  Other (See Comments)   Sumatriptan  Nausea And Vomiting    Severe nausea and vomiting  Other Reaction(s): GI Intolerance   Sulfa Antibiotics Rash    Current Meds  Medication Sig   azelastine  (ASTELIN ) 0.1 % nasal spray USE 2 SPRAYS IN EACH NOSTRIL TWICE DAILY   beclomethasone (QVAR  REDIHALER) 80 MCG/ACT inhaler Inhale 2 puffs into the lungs 2 (two) times daily. Rinse mouth well after use.   cetirizine  (ZYRTEC ) 10 MG tablet TAKE 1 TABLET BY MOUTH DAILY   ciclopirox  (LOPROX ) 0.77 % cream Apply topically 2 (two) times daily.   fluticasone  (FLONASE ) 50 MCG/ACT nasal spray Place into both nostrils daily.   gabapentin  (NEURONTIN ) 100 MG capsule Take 1-3 capsules (100-300 mg total) by mouth at bedtime.   [name] 1/20 1-20 MG-MCG tablet Take 1 tablet by mouth daily.   ketorolac  (TORADOL ) 10 MG tablet Take 1 tablet (10 mg total) by mouth every 6 (six) hours as needed (pain).   meclizine  (ANTIVERT ) 25 MG tablet Take 1  tablet (25 mg total) by mouth 3 (three) times daily as needed for dizziness.   montelukast  (SINGULAIR ) 10 MG tablet TAKE 1 TABLET BY MOUTH AT BEDTIME   ondansetron  (ZOFRAN  ODT) 4 MG disintegrating tablet Take 1-2 tablets (4-8 mg total) by mouth every 8 (eight) hours as needed for nausea or vomiting.   rizatriptan  (MAXALT ) 10 MG tablet Take 1 tablet (10 mg total) by mouth as needed for migraine. May repeat in 2 hours if needed   tiZANidine  (ZANAFLEX ) 4 MG tablet Take 1 tablet (4 mg total) by mouth every 8 (eight) hours as needed for muscle spasms.   Vitamin D , Ergocalciferol , (DRISDOL ) 1.25 MG (50000 UNIT) CAPS capsule Take 1 capsule (50,000 Units total) by mouth every 7 (seven) days.    Immunization History  Administered Date(s) Administered   Influenza, Seasonal, Injecte, Preservative Fre 05/05/2023   Influenza,inj,Quad PF,6+ Mos 06/22/2017, 04/20/2018, 06/09/2019, 06/16/2021, 06/10/2022   Influenza-Unspecified 04/24/2021   PFIZER(Purple Top)SARS-COV-2 Vaccination 11/04/2019,  11/25/2019   Tdap 08/24/2012, 02/03/2023        Objective:     BP 124/80 (BP Location: Right Arm, Cuff Size: Normal)   Pulse 76   Temp (!) 97.1 F (36.2 C)   Ht 5' 3 (1.6 m)   Wt 205 lb 3.2 oz (93.1 kg)   LMP 03/01/2024   SpO2 100%   BMI 36.35 kg/m   SpO2: 100 % O2 Device: None (Room air)  GENERAL: Well-developed, obese woman, no acute distress.  Oral mucosa moist.  No thrush. HEAD: Normocephalic, atraumatic.  EYES: Pupils equal, round, reactive to light.  No scleral icterus.  EARS: Mild serous otitis bilaterally. MOUTH: Dentition intact, clear postnasal drip noted at posterior pharynx. NECK: Supple. No thyromegaly. Trachea midline. No JVD.  No adenopathy. PULMONARY: Good air entry bilaterally.  No adventitious sounds. CARDIOVASCULAR: S1 and S2. Regular rate and rhythm.  No rubs, murmurs or gallops heard. ABDOMEN: Obese otherwise benign. MUSCULOSKELETAL: No joint deformity, no clubbing, no edema.   NEUROLOGIC: No overt focal deficit, no gait disturbance, speech is fluent. SKIN: Intact,warm,dry. PSYCH: Mood and behavior normal.  Chest x-ray obtained 10 Jan 2024 showing no active pulmonary disease, no hiatal hernia, perhaps some mild coarsened interstitial markings.     Lab Results  Component Value Date   NITRICOXIDE 17 03/16/2024  *No significant type II inflammation present.   Assessment & Plan:     ICD-10-CM   1. Chronic cough  R05.3 Nitric oxide     Pulmonary function test    2. Shortness of breath  R06.02 Pulmonary function test    3. Bilateral serous otitis media, unspecified chronicity  H65.93     4. Post-nasal drip  R09.82     5. Complex regional pain syndrome type 1 of left lower extremity  G90.522     6. Positive ANA (antinuclear antibody)  R76.8       Orders Placed This Encounter  Procedures   Nitric oxide    Pulmonary function test    Standing Status:   Future    Expected Date:   03/30/2024    Expiration Date:   03/16/2025    Where should this test be performed?:   Outpatient Pulmonary    What type of PFT is being ordered?:   Full PFT    Meds ordered this encounter  Medications   beclomethasone (QVAR  REDIHALER) 80 MCG/ACT inhaler    Sig: Inhale 2 puffs into the lungs 2 (two) times daily. Rinse mouth well after use.    Dispense:  1 each    Refill:  2   Discussion:    Chronic cough Chronic cough since April, likely related to postnasal drip. No significant findings on chest x-ray from May except perhaps some mild coarsened interstitial changes. Airway inflammation test (nitric oxide ) was normal. Differential includes asthma, despite negative nitric oxide  test, as asthma can have various triggers.  Other possibilities given positive ANA may be potential interstitial lung disease though the chest x-ray was not overly impressive for this. - Order pulmonary function tests - Prescribe Qvar  80 mcg inhaler, 2 puffs twice a day  - Advise against using mint or  menthol containing cough drops - Recommend using non-menthol lozenges or hard candy to manage cough - Consider high-resolution CT chest pending PFTs and rheumatology eval - Schedule follow-up in 4-6 weeks  Postnasal drip Postnasal drip likely contributing to chronic cough. Sinus issues and allergies present. - Left ethmoid sinus disease noted on MRI - Deviated nasal septum to the  left - Recommend Zyrtec  10 mg at bedtime - Continue nasal hygiene - Consider ENT referral in the future if symptoms persist  Complex Regional Pain Syndrome (CRPS) CRPS following ankle surgery with symptoms spreading. Concerns about potential spread to the face, but current symptoms may be related to other conditions. - Await rheumatology evaluation in September   Positive ANA of uncertain significance Patient to have rheumatology evaluation  Advised if symptoms do not improve or worsen, to please contact office for sooner follow up or seek emergency care.    I spent 47 minutes of dedicated to the care of this patient on the date of this encounter to include pre-visit review of records, face-to-face time with the patient discussing conditions above, post visit ordering of testing, clinical documentation with the electronic health record, making appropriate referrals as documented, and communicating necessary findings to members of the patients care team.   C. Leita Sanders, MD Advanced Bronchoscopy [facility]    *This note was dictated using voice recognition software/Dragon.  Despite best efforts to proofread, errors can occur which can change the meaning. Any transcriptional errors that result from this process are unintentional and may not be fully corrected at the time of dictation.

## 2024-03-16 NOTE — Patient Instructions (Addendum)
 VISIT SUMMARY:  Today, you were seen for a chronic cough that has persisted since April. We discussed your symptoms, including the possibility of postnasal drip and asthma as contributing factors. We also reviewed your history of complex regional pain syndrome (CRPS) and your upcoming rheumatology appointment.  YOUR PLAN:  -CHRONIC COUGH: A chronic cough is a cough that lasts for an extended period. Your cough may be related to postnasal drip or asthma. We have ordered pulmonary function tests to further investigate. You have been prescribed an Qvar  inhaler to use twice a day, make sure you rinse your mouth well after you use it.. Avoid using menthol cough drops and instead use non-menthol lozenges or hard candy to manage your cough. We will follow up in 4-6 weeks.  -POSTNASAL DRIP: Postnasal drip occurs when excess mucus from the nose drips down the back of the throat, which can cause a cough. This is likely contributing to your chronic cough. Sinus issues and allergies may also be playing a role.  Recommend Zyrtec  10 mg at bedtime.  -COMPLEX REGIONAL PAIN SYNDROME (CRPS): CRPS is a chronic pain condition that usually affects a limb after an injury or surgery. Your symptoms have spread since your ankle surgery. We will await the results of your rheumatology evaluation in September to better understand your condition.  INSTRUCTIONS:  Please complete the pulmonary function tests as ordered. Use the Qvar  inhaler twice a day and avoid menthol cough drops. Use non-menthol lozenges or hard candy instead. Follow up with us  in 4-6 weeks. Additionally, attend your rheumatology appointment in September for further evaluation of your CRPS and other symptoms.

## 2024-03-17 LAB — MYCOPLASMA PNEUMONIAE,IGG,IGM
Mycoplasma pneumo IgG: 1476 U/mL — ABNORMAL HIGH (ref 0–99)
Mycoplasma pneumo IgM: <770 U/mL (ref 0–769)

## 2024-03-22 ENCOUNTER — Telehealth: Payer: Self-pay | Admitting: Physical Medicine & Rehabilitation

## 2024-03-22 ENCOUNTER — Encounter: Attending: Physical Medicine & Rehabilitation | Admitting: Psychology

## 2024-03-22 DIAGNOSIS — G90522 Complex regional pain syndrome I of left lower limb: Secondary | ICD-10-CM | POA: Diagnosis not present

## 2024-03-22 DIAGNOSIS — F419 Anxiety disorder, unspecified: Secondary | ICD-10-CM

## 2024-03-22 DIAGNOSIS — G894 Chronic pain syndrome: Secondary | ICD-10-CM | POA: Insufficient documentation

## 2024-03-22 NOTE — Telephone Encounter (Signed)
 Patient will like a referral for water therapy as previously discussed, please send   Patient is requesting script  for a walking cane  Please call patient and let her know

## 2024-03-25 ENCOUNTER — Other Ambulatory Visit: Payer: Self-pay | Admitting: Family Medicine

## 2024-03-25 ENCOUNTER — Other Ambulatory Visit: Payer: Self-pay | Admitting: Physical Medicine & Rehabilitation

## 2024-03-25 DIAGNOSIS — J3089 Other allergic rhinitis: Secondary | ICD-10-CM

## 2024-03-29 ENCOUNTER — Encounter: Attending: Physical Medicine & Rehabilitation | Admitting: Psychology

## 2024-03-29 DIAGNOSIS — G894 Chronic pain syndrome: Secondary | ICD-10-CM | POA: Diagnosis not present

## 2024-03-29 DIAGNOSIS — F419 Anxiety disorder, unspecified: Secondary | ICD-10-CM | POA: Insufficient documentation

## 2024-03-29 DIAGNOSIS — G90522 Complex regional pain syndrome I of left lower limb: Secondary | ICD-10-CM | POA: Diagnosis not present

## 2024-03-29 DIAGNOSIS — F331 Major depressive disorder, recurrent, moderate: Secondary | ICD-10-CM
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
## 2024-04-02 ENCOUNTER — Encounter: Payer: Self-pay | Admitting: Psychology

## 2024-04-02 NOTE — Progress Notes (Addendum)
 Neuropsychology / Psychotherapy Note  [place] DEL. [facility]  Physical Medicine and Rehabilitation    Patient: Caitlin Bryant  DOB: 23-Sep-1980  MRN: [id number]  Location:  [facility] for Pain and Re[facility]itative Medicine  [address] [place] [address] Dept: [id number]  Provider: Evalene DOROTHA Riff, PsyD Modality & Individuals Present: The patient was seen in-person, unaccompanied, by the provider in the PM&R outpatient clinic office.   Date of Service: 03/29/24 Start: 1 PM End: 2 PM Duration of Service:  60 min   Reason For Service/Background 03/22/24:The patient was referred for evaluation and treatment by her physiatrist, Dr. Murray, for concerns related to chronic pain and psychiatric symptoms. 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 Symptoms are reported to be developing over left upper extremity and well. Additional details regarding medical are noted below. Upon interview, the patient indicated that goals for treatment for her included having a little more confidence in myself, and get out there and do things.  Depression: Endorsed frequent low mood and declines in energy and concentration. She endorsed excessive guilt/negative self evaluation, reduced enjoyment, and significant changes in weight. This has been present at current levels since late last year, but also present to varying degrees the prior years. She denied any suicidal ideation or psychomotor slowing.   Anxiety: Currently experiences active and significant difficulties with anxiety. Endorsed experiences of excessive/difficult to control worry, muscle tension, fatigue, difficulty concentrating, irritability, and (when leaving the home) feeling on-edge.  Panic: Experiences panic attacks approximately twice per month. Onset in adulthood. Denied  indications of panic disorder.  Trauma Hx/PTSD Sx: Endorsed history of trauma but did not feel comfortable discussing this during the current visit (possibly because of the presence of family), but said she would be willing to discuss it at a future visit.  Mania/Hypomania: Denied any indications of mania/hypomania past or present. Hallucination: None, past or present.  Paranoia: None, past or present.  Thought disorder/Psychosis: None, past or present.  OCD: None, past or present.  Substance Use: None, past or present. Very rare alcohol consumption.  Suicidal Ideation: None, past or present.  Homicidal Ideation: None, past or present.  Risk Factors/Safety Concerns: None. Future oriented. No previous history of current ideation. No SU, Not socially isolated, no recent deaths of loved ones, family is protective factor.  Sleep: Reported trying to obtain about 8 hours of sleep per night. Reported not feeling rested in the morning. Sometimes takes up to an hour to return to sleep at times. Denied significant problems with onset. Denied frequent nightmares. Sleep difficulties have been present for multiple years but lack of feeling rested in the morning has been over the last few months.  Appetite: Increase in appetite. Reported weight gain since time of injury. n Caffeine : Rare Psychiatric Treatment History: Has trialed antidepressant medications; was previously on zoloft  (2021) and has been on duloxetine  for about 8-12 months. She has never engaged in individual therapy.  Psychosocial Stressors: Identified health problems as her primary stressor. Eluded to the presence of significant familial stressors around 2021 but pt did not wish to discuss them at this time.   Tx Plan / Modality -Patient Stated Goal: improve overall confidence levels and activity levels.  Reduction of psychiatric symptoms (anxiety/panic, depression) Reduction of chronic pain related interference on activity levels.   Intervention: Brief CBT for Chronic pain;  psychoeducation, behavioral, CBT, and relaxation/mindfulness based interventions. CBT (General) for depression and anxiety.  Modality: Individual therapy. Weekly (possibly every-other week due to patient's availability) Visits will be in-person, 60 minute duration  Session Content:  Themes/Topics Discussed/Subjective Reports: Rapport building, additional assessment.  Pt initial presentation in session quickly became tearful. Benefited from basic active listening and validation/empathic support, psychoeducation. Pt indicated tendency to try and refrain from expressing emotion. Patient described strong tendencies towards negative self-perceptions and interpretations of events. Described strong anxious rumination/worry tendencies. Focuses tend to be in relation to care for teenage daughter. Very fearful of making mistakes.  Disclosed situation in which she tried to help family but it did not work out and she blames self for outcome. Processed patient thoughts surrounding this and explored accuracy of her assertions given myriad of factors well outside of her control. Blamed self for other actions/activities (excessive). Marked self-doubt. Described unsupportive home environment growing up. Traumatic experiences at other points in life as well.   Observations: Alert, cooperative, attentive and engaged. Anxious+dysphoric at times, but some incongruence / coping via laughter and smiling. Negative self talk and range of cognitive interpretation biases present. Variable insight. Hesitancy to engage appeared reduced by end of visit per behavior and non-verbals.   Therapeutic Interventions/Techniques Used: Provided validation/supportive counseling techniques, psychoeducation, utilized cognitive restructuring techniques to limited extend.   Response to Interventions & Barriers to Progress or Engagement: Appeared to benefit from processing. Rapport increased. Mixed  responsivity to challenge of utility of worry. Safety/reassurance seeking (continues to bring family to visits, although they wait in waiting room).   Safety Concerns/Risk: No SI/HI, no Hx of attempts/ideation.   Goals focused on this session: Building therapeutic relationship/rapport. Validation of patient needs directly in session and indirectly through adjustment of initial session plan based on patient presentation/need. Demonstrating patient worth/self-confidence.       Plan/Goals for Next Session:  -Module 1 chronic pain treatment.  -Behavioral / lifestyle factors impacting pain and psychiatric Sx. -Psychoeducation.  Additional topics to address: Worry/anxious rumination, safety behaviors, self-efficacy, identification of rapid automatic negative thoughts, identification of source/adapt to present vs. Hx learned tendencies, validation/support/normalization.   Diagnosis:  Major depressive disorder, recurrent episode, moderate (HCC) [F33.1]  Anxiety disorder, unspecified type [F41.9] (r/o PTSD, agoraphobia) Complex pain syndrome     Evalene DOROTHA Riff, PsyD Clinical Psychologist / Neuropsychologist  This report was generated using voice recognition software. While this document has been carefully reviewed, transcription errors may be present. I apologize in advance for any inconvenience. Please contact me if further clarification is needed.     Diagnosis:

## 2024-04-05 ENCOUNTER — Encounter: Admitting: Psychology

## 2024-04-05 DIAGNOSIS — F419 Anxiety disorder, unspecified: Secondary | ICD-10-CM

## 2024-04-05 DIAGNOSIS — G894 Chronic pain syndrome: Secondary | ICD-10-CM

## 2024-04-05 DIAGNOSIS — G90522 Complex regional pain syndrome I of left lower limb: Secondary | ICD-10-CM | POA: Diagnosis not present

## 2024-04-05 DIAGNOSIS — F331 Major depressive disorder, recurrent, moderate: Secondary | ICD-10-CM

## 2024-04-10 ENCOUNTER — Other Ambulatory Visit: Payer: Self-pay | Admitting: Family Medicine

## 2024-04-20 ENCOUNTER — Ambulatory Visit (HOSPITAL_BASED_OUTPATIENT_CLINIC_OR_DEPARTMENT_OTHER): Admitting: Physical Therapy

## 2024-04-20 ENCOUNTER — Encounter: Payer: Self-pay | Admitting: Physical Medicine & Rehabilitation

## 2024-04-20 ENCOUNTER — Encounter: Admitting: Physical Medicine & Rehabilitation

## 2024-04-20 VITALS — BP 118/81 | HR 84 | Ht 63.0 in | Wt 206.4 lb

## 2024-04-20 DIAGNOSIS — F419 Anxiety disorder, unspecified: Secondary | ICD-10-CM | POA: Diagnosis not present

## 2024-04-20 DIAGNOSIS — G90522 Complex regional pain syndrome I of left lower limb: Secondary | ICD-10-CM

## 2024-04-20 DIAGNOSIS — F331 Major depressive disorder, recurrent, moderate: Secondary | ICD-10-CM | POA: Diagnosis not present

## 2024-04-20 MED ORDER — NORTRIPTYLINE HCL 10 MG PO CAPS: 10.0000 mg | ORAL_CAPSULE | Freq: Every day | ORAL | 5 refills | Status: AC

## 2024-04-20 NOTE — Progress Notes (Signed)
 Subjective:    Patient ID: Caitlin Bryant, female    DOB: 1981/08/07, 43 y.o.   MRN: [id number]  HPI HPI  Caitlin Bryant is a 43 y.o. year old female  who  has a past medical history of Anxiety, Headache, and MRSA (methicillin resistant staph aureus) culture positive.   They are presenting to PM&R clinic as a new patient for pain management evaluation. They were referred for treatment of chronic pain. She reports her pain started after her ankle tendon surgery Aug 19, 2021.  She reports she had the surgery due to frequent ankle sprains.  She had a nerve block at the time behind her knee.  She completed physical therapy after her surgery.  Since this time has has had severe pain in her left lower extremity.  Patient reports her pain began August 19, 2021 when she had left ankle tendon surgery.  Since this time.  She has had worsening pain in her left lower extremity.  Her leg is very painful from her lower left thigh down to her foot.  She previously worked as a Runner, broadcasting/film/video.  She has been much less active and has had weight gain.  She is only able to walk very short distances.  Pain is progressively getting worse.  She has numbness and tingling in her distal toes.  Light touch is a very painful around her foot and ankle.  The sensation of showering is very painful.  Her pain is worse at night.  She has had a lot of swelling in her lower extremities since this time from her knee down to her foot.  She has been followed by neurology.  She was seen by Dr. Addie who was suspecting CRPS.  She initially had an EMG that was reported to have altered sensory function however EMG was repeated by Dr. Leigh neurology and no significant abnormalities were noted.   Red flag symptoms: Patient denies saddle anesthesia, loss of bowel or bladder continence, new weakness, new numbness/tingling, or pain waking up at nighttime.   Medications tried: Gabapentin  -unsure dose, only able to take HS due to sedation, liminal  benefit for pain Ibuprofen  200-400mg  2-3 times a day     Interval History 10/01/22 She reports continued pain in her left lower extremity that continues to worsen.  She has not noted significant benefit with Lyrica  however she has been able to tolerate this better than gabapentin  thus far.  She has started physical therapy.  She reports that desensitization techniques are tolerable initially however hours later she will have severe pain in this area.  Her pain continues to cover her entire left leg.  She sometimes feels a burning sensation in her toes.  She has a lot of paresthesias in her leg as well.  She will also be trying in aquatic therapy in addition to a land-based therapy.  Occasionally her leg will become red.  Her left leg has increased swelling compared to her right leg.   Interval History 11/24/22 Patient is here for follow-up of her left lower extremity pain.  She continues to work with physical therapy.  She also has some difficulties recently due to illness with her husband as well.  She feels like her pain is largely unchanged.  Possibly with some increased ability to push to the pain per therapy notes.  She is scheduled for some more pool therapy.  She is only taking Lyrica  at night and this continues to cause her sedation.  While she does not  feel like the leg is insensate to touch, she feels that she cannot sense deep pressure as much on her left leg as she can on her right leg.  She is frustrated with her limitations.   Interval History 01/22/23 Patient is here for follow-up of her left lower extremity pain.  She reports she had 1 sympathetic block completed, provided benefit for about a day and pain was back to baseline within a week.  She has an additional injection scheduled next week.  She has not been using tramadol  because she is cautious with this medication and does not want to develop an addiction.  She reports she used half a tablet of tramadol  on one occasion.  She has not  noticed significant pain relief however she also did not have any significant side effects.  Her PCP has started her on Cymbalta  30 mg, she is not sure if this is providing significant benefit.  She reports having occasional muscle cramps in her right leg and foot.  She has noticed was much slower on her left than her right foot.   Interval History 05/20/2023 Patient is here for follow-up regarding her lower extremity pain suspected to be due to the CPRS.  She reports her pain continues to be severe and is worse from prior.  She continues with duloxetine  but does not feel like it is helping her mood or pain.  She brought her tramadol  into be destroyed because she did not feel like this was helping.  She reports she is not very active although tries to do which she can.  After activities she has a lot of swelling in her left leg.  She reports she has been diagnosed with plantar fasciitis right lower extremity.  She has been offered a spinal cord stimulator but does not want this.  She reports occasional numbness of her left great toe.  She requests letter to have her dog as a emotional support animal, she says caring for him helps with her and forces her to be more active.  Interval History 07/29/23 Patient is here for follow-up regarding chronic CPRS pain.  Her primary concern today is that she had significant withdrawal symptoms when trying to discontinue duloxetine  30 mg.  She has restarted this medication and has been afraid to go down to the 20 mg daily dose.  She has not yet started prazosin , was not available at the pharmacy.  Pain continues to be severe in her left lower extremity and largely unchanged from prior visit.  She is still interested in spinal cord stimulator.  She would like to continue using a handicap placard due to her limited ambulation distance.  Interval History 11/08/22 Patient is here for follow-up regarding her CRPS pain.   Patient has been continue to monitor wean down on  duloxetine  because she would not feel like it is helping her pain.  She recently ran out of the 20 mg dose so has been using 30 mg duloxetine .  Patient did not tolerate prazosin , and also did not help her pain.  She continues to have severe pain in her left leg.  She tries to stay active but pain limits her activities.  She also has been developing some pain and swelling in her left arm over the past few weeks.  She has been taking gabapentin  100 mg at night intermittently, higher doses caused sedation.  Interval History 01/21/24 Patient continues to have a lot of pain in her left flank, left arm and having pain in  her left face as well.  She has been following with [facility] and she reports that she has an MRI of her brain ordered for tomorrow.  They have also tried a few other interventions to help her pain.  She would like to try taking slightly higher doses of gabapentin , she has been taking 100 mg at night previously due to concerns of sedation but tolerating a little bit better.  She still is hesitant to try spinal cord stimulator and aquatic therapy limited by cost.  She has weaned off compounded low-dose Cymbalta , has been off of this for about a week.  Reports still having some withdrawal symptoms but sounds like she is able to tolerate this.  Interval History 04/20/24 Reports severe pain in L foot and L leg with stabbing sensations, particularly in heel. Pain episodes described as contractions occurring frequently. Left ankle experiences episodes where it feels like it's ripping apart or coming apart causing screaming pain. Episodes can last throughout entire day.  She is also having pain, burning and swelling in her left arm.  She also had pain in her left breast briefly but this has improved.  Furthermore she also has pain and tenderness that is less severe in her right foot and lower leg.  She report recent ankle x-rays performed by Dr. Claudene showed no abnormalities.  She says B12 injection administered. Blood work completed with vitamin deficiencies identified.  She has a history of fibromyalgia.   Pain Inventory Average Pain 5 Pain Right Now 5 My pain is constant, sharp, burning, dull, stabbing, tingling, and aching  In the last 24 hours, has pain interfered with the following? General activity 9 Relation with others7 Enjoyment of life 3 What TIME of day is your pain at its worst? morning , daytime, evening, and night Sleep (in general) Poor  Pain is worse with: walking, bending, sitting, inactivity, standing, and some activites Pain improves with: rest and nothing Relief from Meds: 2  Family History  Problem Relation Age of Onset   Hypertension Mother    Diabetes Mother    Osteoarthritis Mother    Fibromyalgia Mother    Heart attack Father        Defibrillator   Social History   Socioeconomic History   Marital status: Married    Spouse name: Not on file   Number of children: Not on file   Years of education: Not on file   Highest education level: Not on file  Occupational History   Not on file  Tobacco Use   Smoking status: Never   Smokeless tobacco: Never  Vaping Use   Vaping status: Never Used  Substance and Sexual Activity   Alcohol use: Yes    Comment: 1 a month or less   Drug use: No   Sexual activity: Yes    Partners: Male  Other Topics Concern   Not on file  Social History Narrative   Are you right handed or left handed? Right   Are you currently employed ? no      Do you live at home alone?husband and 2 daughters   Caffeine  none   What type of home do you live in: 1 story or 2 story? One with stairs inside       Social Drivers of Health   Financial Resource Strain: Not on file  Food Insecurity: Not on file  Transportation Needs: Not on file  Physical Activity: Not on file  Stress: Not on file  Social  Connections: Not on file   Past Surgical History:  Procedure Laterality Date   ANKLE ARTHROSCOPY  WITH RECONSTRUCTION Left 08/19/2021   Procedure: LEFT ANKLE ARTHROSCOPY, ARTHROSCOPIC TREATMENT OF TALUS OSTEOCHONDRAL LESION, LATERAL LIGAMENT RECONSTRUCTION, PERONEAL TENDON DEBRIDEMENT, REPAIR OF DISLOCATING PERONEAL TENDONS;  Surgeon: Elsa Lonni SAUNDERS, MD;  Location: [facility];  Service: Orthopedics;  Laterality: Left;  LENGTH OF SURGERY: 120 MINUTES   CESAREAN SECTION  2004 and 2008   x 2   DIAGNOSTIC LAPAROSCOPY     TONSILLECTOMY     Past Surgical History:  Procedure Laterality Date   ANKLE ARTHROSCOPY WITH RECONSTRUCTION Left 08/19/2021   Procedure: LEFT ANKLE ARTHROSCOPY, ARTHROSCOPIC TREATMENT OF TALUS OSTEOCHONDRAL LESION, LATERAL LIGAMENT RECONSTRUCTION, PERONEAL TENDON DEBRIDEMENT, REPAIR OF DISLOCATING PERONEAL TENDONS;  Surgeon: Elsa Lonni SAUNDERS, MD;  Location: [facility];  Service: Orthopedics;  Laterality: Left;  LENGTH OF SURGERY: 120 MINUTES   CESAREAN SECTION  2004 and 2008   x 2   DIAGNOSTIC LAPAROSCOPY     TONSILLECTOMY     Past Medical History:  Diagnosis Date   Anxiety    Headache    MRSA (methicillin resistant staph aureus) culture positive    5 or more years ago, right thumb   BP 118/81   Pulse 84   Ht 5' 3 (1.6 m)   Wt 206 lb 6.4 oz (93.6 kg)   SpO2 98%   BMI 36.56 kg/m   Opioid Risk Score:   Fall Risk Score:  `1  Depression screen [facility] 2/9     04/20/2024    3:40 PM 01/21/2024    3:37 PM 11/08/2023    3:17 PM 10/06/2023    2:33 PM 07/29/2023    2:12 PM 07/13/2023    4:02 PM 05/20/2023    2:36 PM  Depression screen PHQ 2/9  Decreased Interest 1 1 1 2 1 2  0  Down, Depressed, Hopeless 1 1 1 2 1 2  0  PHQ - 2 Score 2 2 2 4 2 4  0  Altered sleeping    2  2   Tired, decreased energy    2  2   Change in appetite    2  2   Feeling bad or failure about yourself     2  1   Trouble concentrating    2  2   Moving slowly or fidgety/restless    2  1   Suicidal thoughts    0  0   PHQ-9 Score    16  14   Difficult doing  work/chores    Very difficult  Somewhat difficult       Review of Systems  HENT:         Left side facial pain  Musculoskeletal:  Positive for gait problem.       Pain in both feet, left hip pain down to left foot, pain in left shoulder  Pain on the right-side also in the right arm, right chest down to right foot.  All other systems reviewed and are negative.      Objective:   Physical Exam    04/20/2024    3:33 PM 03/16/2024    8:30 AM 03/15/2024    9:58 AM  Vitals with BMI  Height 5' 3 5' 3 5' 3  Weight 206 lbs 6 oz 205 lbs 3 oz 202 lbs  BMI 36.57 36.36 35.79  Systolic 118 124 879  Diastolic 81 80 84  Pulse 84 76 87  Gen: no distress, normal appearing HEENT: oral mucosa pink and moist, NCAT Chest: normal effort, normal rate of breathing Abd: soft, non-distended Psych: pleasant, normal affect Skin: intact Neuro: CN 2-12 grossly intact, follows commands,  Strength bilateral upper extremities 5/5 throughout Strength bilateral lower extremities at least 4/5 limited by pain Sensation intact light touch in all 4 extremities however she reports his altered throughout L leg Antalgic gait Musculoskeletal  Left lower extremity: Significant allodynia to light touch extending from foot to ankle region Right lower extremity: Mild tenderness, less swelling Right upper extremity: Swelling  color changes, allodynia Left upper extremity: No significant issues noted     Assessment & Plan:  2)CPRS LLE pain after L ankle tendon surgery 2022-she also appears to be developing symptoms over her left upper extremity and likely right lower extremity 2) Reported history of fibromyalgia  -Normal EMG LLE 08/10/22 -Agree with neurology and orthopedics, suspect CRPS type 1 -She reports poor benefit with Lyrica , duloxetine , tramadol , ibuprofen , Prazosin  -Completed PT previously without great benefit -She reports poor results with sympathetic blocks x 2 -ORT mod -Pain agreement and  UDS completed prior visit -Patient had difficulty weaning down from Cymbalta  --Starting nortriptyline  10mg  at bedtime, may increase to 20mg  after one week if tolerated well.  -Spinal cord stimulator-she declines today due to concern with having a spinal procedure -Consider low-dose opioid medication however avoid escalation, patient would like to hold off -MRI ordered by Dr. Darlis neurosurgery and spine -does not appear to show cause of her pain -Continue with plan for PT/aquatic therapy -She could try doing self exercises in the pool -Continue f/u with Dr. Hayden for pain psychology treatment as directed -Continue gabapentin  current dose, limited by sedation - Patient following with Dr. Arthea Sharps, continue as directed - Consider Trileptal --Discussed considering Qutenza as an option for neuropathic pain control.  Tentatively scheduled for 1 patch treatment next visit.  Check cost of medication/insurance coverage - Consider low-dose naltrexone  Muscle cramps -Discussed trying magnesium supplement prior visit  Plantar fasciitis -Heel stretch, arch supporting shoe insoles

## 2024-04-26 ENCOUNTER — Encounter: Attending: Physical Medicine & Rehabilitation | Admitting: Psychology

## 2024-04-26 DIAGNOSIS — G894 Chronic pain syndrome: Secondary | ICD-10-CM

## 2024-04-26 DIAGNOSIS — F419 Anxiety disorder, unspecified: Secondary | ICD-10-CM | POA: Diagnosis not present

## 2024-04-26 DIAGNOSIS — G90522 Complex regional pain syndrome I of left lower limb: Secondary | ICD-10-CM | POA: Insufficient documentation

## 2024-04-26 DIAGNOSIS — F331 Major depressive disorder, recurrent, moderate: Secondary | ICD-10-CM | POA: Insufficient documentation

## 2024-04-27 DIAGNOSIS — D485 Neoplasm of uncertain behavior of skin: Secondary | ICD-10-CM | POA: Diagnosis not present

## 2024-04-27 DIAGNOSIS — D225 Melanocytic nevi of trunk: Secondary | ICD-10-CM | POA: Diagnosis not present

## 2024-04-27 DIAGNOSIS — L821 Other seborrheic keratosis: Secondary | ICD-10-CM | POA: Diagnosis not present

## 2024-05-03 ENCOUNTER — Encounter: Admitting: Psychology

## 2024-05-03 ENCOUNTER — Ambulatory Visit: Admitting: Dermatology

## 2024-05-03 DIAGNOSIS — G894 Chronic pain syndrome: Secondary | ICD-10-CM | POA: Diagnosis not present

## 2024-05-03 DIAGNOSIS — G90522 Complex regional pain syndrome I of left lower limb: Secondary | ICD-10-CM | POA: Diagnosis not present

## 2024-05-03 DIAGNOSIS — F419 Anxiety disorder, unspecified: Secondary | ICD-10-CM

## 2024-05-03 DIAGNOSIS — F331 Major depressive disorder, recurrent, moderate: Secondary | ICD-10-CM | POA: Diagnosis not present

## 2024-05-05 ENCOUNTER — Ambulatory Visit: Admitting: Pulmonary Disease

## 2024-05-05 ENCOUNTER — Encounter

## 2024-05-10 ENCOUNTER — Encounter (HOSPITAL_BASED_OUTPATIENT_CLINIC_OR_DEPARTMENT_OTHER): Payer: Self-pay | Admitting: Physical Therapy

## 2024-05-10 ENCOUNTER — Other Ambulatory Visit: Payer: Self-pay

## 2024-05-10 ENCOUNTER — Ambulatory Visit (HOSPITAL_BASED_OUTPATIENT_CLINIC_OR_DEPARTMENT_OTHER): Attending: Physical Medicine & Rehabilitation | Admitting: Physical Therapy

## 2024-05-10 DIAGNOSIS — G894 Chronic pain syndrome: Secondary | ICD-10-CM | POA: Insufficient documentation

## 2024-05-10 DIAGNOSIS — R202 Paresthesia of skin: Secondary | ICD-10-CM | POA: Insufficient documentation

## 2024-05-10 DIAGNOSIS — M25572 Pain in left ankle and joints of left foot: Secondary | ICD-10-CM | POA: Insufficient documentation

## 2024-05-10 DIAGNOSIS — M79662 Pain in left lower leg: Secondary | ICD-10-CM | POA: Diagnosis not present

## 2024-05-10 DIAGNOSIS — G90522 Complex regional pain syndrome I of left lower limb: Secondary | ICD-10-CM | POA: Insufficient documentation

## 2024-05-10 NOTE — Therapy (Signed)
 OUTPATIENT PHYSICAL THERAPY LOWER EXTREMITY EVALUATION   Patient Name: Caitlin Bryant MRN: [id number] DOB:02-10-1981, 43 y.o., female Today's Date: 05/10/2024  END OF SESSION:  PT End of Session - 05/10/24 1032     Visit Number 1    Date for PT Re-Evaluation 07/07/24    PT Start Time 0846    PT Stop Time 0928    PT Time Calculation (min) 42 min    Activity Tolerance Patient tolerated treatment well;Patient limited by pain    Behavior During Therapy [facility] for tasks assessed/performed          Past Medical History:  Diagnosis Date   Anxiety    Headache    MRSA (methicillin resistant staph aureus) culture positive    5 or more years ago, right thumb   Past Surgical History:  Procedure Laterality Date   ANKLE ARTHROSCOPY WITH RECONSTRUCTION Left 08/19/2021   Procedure: LEFT ANKLE ARTHROSCOPY, ARTHROSCOPIC TREATMENT OF TALUS OSTEOCHONDRAL LESION, LATERAL LIGAMENT RECONSTRUCTION, PERONEAL TENDON DEBRIDEMENT, REPAIR OF DISLOCATING PERONEAL TENDONS;  Surgeon: Elsa Lonni SAUNDERS, MD;  Location: [facility];  Service: Orthopedics;  Laterality: Left;  LENGTH OF SURGERY: 120 MINUTES   CESAREAN SECTION  2004 and 2008   x 2   DIAGNOSTIC LAPAROSCOPY     TONSILLECTOMY     Patient Active Problem List   Diagnosis Date Noted   Chronic cough 03/15/2024   Seborrheic keratosis 07/13/2023   Fibrous papule of face 07/13/2023   Plantar wart 07/13/2023   Plantar fasciitis, right 05/05/2023   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 05/24/2022   Complex regional pain syndrome i of left lower limb 05/24/2022   Obesity (BMI 35.0-39.9 without comorbidity) 06/25/2021   Disorder of lumbosacral intervertebral disc 06/15/2021   Chronic pain of left ankle 06/08/2021   Routine general medical examination at a health care facility 03/17/2021   Other fatigue 02/23/2021   Acute bilateral low back pain without sciatica 06/09/2019   Left knee pain 04/20/2018    Impacted cerumen of right ear 04/15/2017   Neck swelling 08/07/2016   Depression 07/24/2015   TMJ dysfunction 07/03/2015   Migraine 05/10/2015   Ankle instability, left 02/22/2015   Generalized anxiety disorder 01/31/2015   Allergic rhinitis 01/31/2015    PCP: Toribio Slain MD  REFERRING PROVIDER: Murray Loupe MD   REFERRING DIAG:  G90.522 (ICD-10-CM) - Complex regional pain syndrome type 1 of left lower extremity  G89.4 (ICD-10-CM) - Chronic pain syndrome    THERAPY DIAG:  Pain in left ankle and joints of left foot  Pain in left lower leg  Paresthesia of skin  Rationale for Evaluation and Treatment: Rehabilitation  ONSET DATE: 2021  SUBJECTIVE:   SUBJECTIVE STATEMENT: Have had lots of therapy but the land just hurts and has gotten progressively worse. Did 1 session of water therapy last year and wanted to try more. Was at neuro. CRPS is spreading. Husband present with her and has built a pool .  She is unable to climb up the ladder. CRPS started out in toes after surgery 2021 from left ankle reconstruction.   PERTINENT HISTORY: Leg pain, CRPS, consider aquatic therapy   CPRS LLE pain after L ankle tendon surgery 2022-she also appears to be developing symptoms over her left upper extremity and likely right lower extremity   Are you having pain? Yes: NPRS scale: current 5/10; worst 8/10; least 4/10 Pain location: generalized all left side, right foot  Pain description: burning, sharp; stabbing constant Aggravating  factors: everything Relieving factors: le elevation  PRECAUTIONS: None  RED FLAGS: None   WEIGHT BEARING RESTRICTIONS: No  FALLS:  Has patient fallen in last 6 months? No  OCCUPATION: disability  PLOF: Needs assistance with ADLs, Needs assistance with homemaking, Needs assistance with gait, and Needs assistance with transfers  PATIENT GOALS: decreased pain and  more mobility  NEXT MD VISIT: as needed  OBJECTIVE:  Note: Objective measures  were completed at Evaluation unless otherwise noted.   PATIENT SURVEYS:  LEFS 9/80  COGNITION: Overall cognitive status: Within functional limits for tasks assessed     SENSATION: CRPS  EDEMA:  Mild edema about left foot and ankle; left wrist    POSTURE: No Significant postural limitations  PALPATION: Superficial Touch increases nerve pain through LLE and lue through elbow  LUMBAR ROM wfl given extra time for movement into end range due to pain  LOWER EXTREMITY ROM:  Active ROM Right eval Left eval  Hip flexion full full  Hip extension    Hip abduction    Hip adduction    Hip internal rotation    Hip external rotation    Knee flexion    Knee extension    Ankle dorsiflexion    Ankle plantarflexion    Ankle inversion    Ankle eversion     (Blank rows = not tested)  LOWER EXTREMITY MMT:   P!=pain MMT Right eval Left eval  Hip flexion 4P! 3+P!  Hip extension    Hip abduction    Hip adduction    Hip internal rotation    Hip external rotation    Knee flexion 5 4  Knee extension 5 5  Ankle dorsiflexion 4+ 4  Ankle plantarflexion    Ankle inversion    Ankle eversion     (Blank rows = not tested)   FUNCTIONAL TESTS:  Timed up and go (TUG): 20  4 stage balance: Passed 1; assistance to gain semi tandem hold x7 s; Tandem and SLS not able to complete GAIT: Distance walked: 400 ft from lobby  Assistive device utilized: none Level of assistance: HHA husband Comments: walking on toes in slight knee flex                                                                                                                                TREATMENT  Eval Self care:Posture and Optometrist instruction; Ad/assistance with amb; safety    PATIENT EDUCATION:  Education details: Discussed eval findings, rehab rationale, aquatic program progression/POC and pools in area. Patient is in agreement  Person educated: Patient and Spouse Education method:  Explanation Education comprehension: verbalized understanding  HOME EXERCISE PROGRAM: From prior land based session [id number]   ASSESSMENT:  CLINICAL IMPRESSION: Patient is a 43 y.o. f who was seen today for physical therapy evaluation and treatment for CRPS left extremities. She is well known to [facility] rehab having been seen for episode ~ 1 year  ago on land with 1 aquatic session for similar dx. She returns today with interest in a trial of aquatic intervention for improved function and pain management as land provided little to no results. CRPS reportedly appears to be spreading now including her LUE and right foot.   She presents with pain limited deficits in ROM, endurance, activity tolerance, gait, balance, and functional mobility with ADL's. She is a good candidate for aquatic intervention and will benefit from the properties of water to progress towards functional goals.    OBJECTIVE IMPAIRMENTS: Abnormal gait, decreased activity tolerance, decreased balance, decreased endurance, decreased knowledge of condition, decreased mobility, difficulty walking, decreased ROM, decreased strength, obesity, and pain.   ACTIVITY LIMITATIONS: carrying, lifting, sitting, standing, squatting, stairs, transfers, and bed mobility  PARTICIPATION LIMITATIONS: meal prep, cleaning, laundry, driving, shopping, community activity, and occupation  REHAB POTENTIAL: Good  CLINICAL DECISION MAKING: Evolving/moderate complexity  EVALUATION COMPLEXITY: Moderate   GOALS: Goals reviewed with patient? Yes  SHORT TERM GOALS: Target date: 10/17 Pt will tolerate full aquatic sessions consistently without increase in pain and with improving function to demonstrate good toleration and effectiveness of intervention.  Baseline: Goal status: INITIAL  2.  Pt will enter/exit pool using steps without excessive pain or difficulty Baseline:  Goal status: INITIAL  3.  Pt will report a 25% reduction of pain while  submerged as evidence of using properties of water for pain management Baseline:  Goal status: INITIAL   LONG TERM GOALS: Target date: 11/14  Pt to improve on LEFS by at least 9 point to demonstrate statistically significant Improvement in function. Baseline:LEFS 9/80 Goal status: INITIAL  2.  Pt will report decrease in pain by at least 25% for improved toleration to activity/quality of life and to demonstrate improved management of pain. Baseline:  Goal status: INITIAL  3.  Pt will improve strength in hips by 1 grade to demonstrate improved overall physical function. Baseline:  Goal status: INITIAL  4.  Pt will tolerate walking either to or from setting (440ft) and tolerate full aquatic sessions without increase in pain or significant fatigue to demonstrate improved amb toleration Baseline:  Goal status: INITIAL     PLAN:  PT FREQUENCY: 1-2x/week  PT DURATION: 8 weeks likely 12 sessions to determine benefit  PLANNED INTERVENTIONS: 97164- PT Re-evaluation, 97750- Physical Performance Testing, 97110-Therapeutic exercises, 97530- Therapeutic activity, 97112- Neuromuscular re-education, 97535- Self Care, [id number]- Manual therapy, [id number]- Gait training, [phone]- Aquatic Therapy, [phone] (1-2 muscles), 20561 (3+ muscles)- Dry Needling, Patient/Family education, Balance training, Stair training, Taping, Joint mobilization, DME instructions, Cryotherapy, and Moist heat  PLAN FOR NEXT SESSION: aquatics only: pain management; gentle movement; strengthening and stretching as tolerated; balance retraining   Ronal Foots) Kevontae Burgoon MPT 05/10/24 10:39 AM [facility] [address] [place], [place], [address] Phone: [phone]   Fax:  [phone]

## 2024-05-12 ENCOUNTER — Encounter: Admitting: Psychology

## 2024-05-16 NOTE — Progress Notes (Unsigned)
 [facility] [address] [place] [address] Phone: [phone] Subjective:   Caitlin Bryant, am serving as a scribe for Dr. Arthea Claudene.  I'm seeing this patient by the request  of:  Chandra Toribio POUR, MD  CC: Multiple joint pain  YEP:Dlagzrupcz  03/15/2024 Referral to pulmonology.  Possible GI versus pulmonary in nature.  Will refer for further workup     Patient is send diagnosis with complex regional pain syndrome type I.  Is seen multiple different providers for this problem.  Seeing pain management where she is getting most couple different medications and treatments for this.  Has had difficulty controlling it completely.  Patient feels that the pain does affect daily activities including her going to places such as the science center, store, or the doing things with the family secondary to difficulty with walking and increasing pain with the activity and thereafter.  Patient even has some difficulty with activities of daily living.  Intermittently has swelling and did show a picture of what appears to be more of a perennial tendon subluxation of the lower extremity or the possibility of even some type of tightness in the area.  Discussed with patient with laboratory workup was found to have B12 deficiency that could give her some of the problem but do not think it would with the longevity of her symptoms but I do feel it is worth giving an injection as well as starting oral supplementation.  Has had chronic cough for some time and will check if mycoplasma and more joint pain is contributing.  Refer patient to pulmonology.  Patient brought up the idea of the disability aspect of her pain and would need to talk to other physicians for this.     Updated 05/17/2024 Caitlin Bryant is a 43 y.o. female coming in with complaint of polyarthralgia. Here talk about CRPS and L ankle, R foot and knee pain. L ankle pain can be sharp and burning constant pain. R  knee feels it more when doing stairs.  Patient is being followed by physical medicine and rehabilitation and has started with more physical therapy as well.  Patient was started on nortriptyline  recently.  Considering multiple other treatment options.   Found to have calcium oxalate crystals in urine and was referred to urology.  Past Medical History:  Diagnosis Date   Anxiety    Headache    MRSA (methicillin resistant staph aureus) culture positive    5 or more years ago, right thumb   Past Surgical History:  Procedure Laterality Date   ANKLE ARTHROSCOPY WITH RECONSTRUCTION Left 08/19/2021   Procedure: LEFT ANKLE ARTHROSCOPY, ARTHROSCOPIC TREATMENT OF TALUS OSTEOCHONDRAL LESION, LATERAL LIGAMENT RECONSTRUCTION, PERONEAL TENDON DEBRIDEMENT, REPAIR OF DISLOCATING PERONEAL TENDONS;  Surgeon: Elsa Lonni SAUNDERS, MD;  Location: [facility];  Service: Orthopedics;  Laterality: Left;  LENGTH OF SURGERY: 120 MINUTES   CESAREAN SECTION  2004 and 2008   x 2   DIAGNOSTIC LAPAROSCOPY     TONSILLECTOMY     Social History   Socioeconomic History   Marital status: Married    Spouse name: Not on file   Number of children: Not on file   Years of education: Not on file   Highest education level: Not on file  Occupational History   Not on file  Tobacco Use   Smoking status: Never   Smokeless tobacco: Never  Vaping Use   Vaping status: Never Used  Substance and Sexual Activity  Alcohol use: Yes    Comment: 1 a month or less   Drug use: No   Sexual activity: Yes    Partners: Male  Other Topics Concern   Not on file  Social History Narrative   Are you right handed or left handed? Right   Are you currently employed ? no      Do you live at home alone?husband and 2 daughters   Caffeine  none   What type of home do you live in: 1 story or 2 story? One with stairs inside       Social Drivers of Health   Financial Resource Strain: Not on file  Food Insecurity: Not on  file  Transportation Needs: Not on file  Physical Activity: Not on file  Stress: Not on file  Social Connections: Not on file   Allergies  Allergen Reactions   Prazosin  Other (See Comments)   Sumatriptan  Nausea And Vomiting    Severe nausea and vomiting  Other Reaction(s): GI Intolerance   Sulfa Antibiotics Rash   Family History  Problem Relation Age of Onset   Hypertension Mother    Diabetes Mother    Osteoarthritis Mother    Fibromyalgia Mother    Heart attack Father        Defibrillator    Current Outpatient Medications (Endocrine & Metabolic):    [name] 1/20 1-20 MG-MCG tablet, Take 1 tablet by mouth daily.   Current Outpatient Medications (Respiratory):    azelastine  (ASTELIN ) 0.1 % nasal spray, USE 2 SPRAYS IN EACH NOSTRIL TWICE DAILY   beclomethasone (QVAR  REDIHALER) 80 MCG/ACT inhaler, Inhale 2 puffs into the lungs 2 (two) times daily. Rinse mouth well after use.   cetirizine  (ZYRTEC ) 10 MG tablet, TAKE 1 TABLET BY MOUTH DAILY   fluticasone  (FLONASE ) 50 MCG/ACT nasal spray, Place into both nostrils daily.   montelukast  (SINGULAIR ) 10 MG tablet, TAKE 1 TABLET BY MOUTH AT BEDTIME  Current Outpatient Medications (Analgesics):    ketorolac  (TORADOL ) 10 MG tablet, Take 1 tablet (10 mg total) by mouth every 6 (six) hours as needed (pain).   rizatriptan  (MAXALT ) 10 MG tablet, Take 1 tablet (10 mg total) by mouth as needed for migraine. May repeat in 2 hours if needed   Current Outpatient Medications (Other):    ciclopirox  (LOPROX ) 0.77 % cream, Apply topically 2 (two) times daily.   gabapentin  (NEURONTIN ) 100 MG capsule, Take 1-3 capsules (100-300 mg total) by mouth at bedtime.   meclizine  (ANTIVERT ) 25 MG tablet, Take 1 tablet (25 mg total) by mouth 3 (three) times daily as needed for dizziness.   nortriptyline  (PAMELOR ) 10 MG capsule, Take 1-2 capsules (10-20 mg total) by mouth at bedtime. Start with 1 cap at nightly, after 1 week can increase to 2 caps nightly    ondansetron  (ZOFRAN  ODT) 4 MG disintegrating tablet, Take 1-2 tablets (4-8 mg total) by mouth every 8 (eight) hours as needed for nausea or vomiting.   tiZANidine  (ZANAFLEX ) 4 MG tablet, Take 1 tablet (4 mg total) by mouth every 8 (eight) hours as needed for muscle spasms. (Patient not taking: Reported on 04/20/2024)   Vitamin D , Ergocalciferol , (DRISDOL ) 1.25 MG (50000 UNIT) CAPS capsule, TAKE 1 CAPSULE BY MOUTH ONCE WEEKLY   Reviewed prior external information including notes and imaging from  primary care provider As well as notes that were available from care everywhere and other healthcare systems.  Past medical history, social, surgical and family history all reviewed in electronic medical record.  No  pertanent information unless stated regarding to the chief complaint.   Review of Systems:  No headache, visual changes, nausea, vomiting, diarrhea, constipation, dizziness, abdominal pain, skin rash, fevers, chills, night sweats, weight loss, swollen lymph nodes, body aches, joint swelling, chest pain, shortness of breath, mood changes. POSITIVE muscle aches  Objective  There were no vitals taken for this visit.   General: No apparent distress alert and oriented x3 mood and affect normal, dressed appropriately.  HEENT: Pupils equal, extraocular movements intact  Respiratory: Patient's speak in full sentences and does not appear short of breath  Cardiovascular: No lower extremity edema, non tender, no erythema      Impression and Recommendations:     The above documentation has been reviewed and is accurate and complete Lennon Richins M Luqman Perrelli, DO

## 2024-05-17 ENCOUNTER — Ambulatory Visit: Admitting: Family Medicine

## 2024-05-17 VITALS — BP 128/86 | HR 101 | Ht 63.0 in | Wt 205.0 lb

## 2024-05-17 DIAGNOSIS — M222X1 Patellofemoral disorders, right knee: Secondary | ICD-10-CM

## 2024-05-17 DIAGNOSIS — G90522 Complex regional pain syndrome I of left lower limb: Secondary | ICD-10-CM | POA: Diagnosis not present

## 2024-05-17 DIAGNOSIS — R768 Other specified abnormal immunological findings in serum: Secondary | ICD-10-CM | POA: Diagnosis not present

## 2024-05-17 DIAGNOSIS — R82998 Other abnormal findings in urine: Secondary | ICD-10-CM

## 2024-05-17 LAB — URINALYSIS, ROUTINE W REFLEX MICROSCOPIC
Bilirubin Urine: NEGATIVE
Ketones, ur: NEGATIVE
Leukocytes,Ua: NEGATIVE
Nitrite: NEGATIVE
Specific Gravity, Urine: 1.025 (ref 1.000–1.030)
Total Protein, Urine: NEGATIVE
Urine Glucose: NEGATIVE
Urobilinogen, UA: 0.2 (ref 0.0–1.0)
pH: 6 (ref 5.0–8.0)

## 2024-05-17 LAB — VITAMIN D 25 HYDROXY (VIT D DEFICIENCY, FRACTURES): VITD: 26.8 ng/mL — ABNORMAL LOW (ref 30.00–100.00)

## 2024-05-17 LAB — VITAMIN B12: Vitamin B-12: 562 pg/mL (ref 211–911)

## 2024-05-17 NOTE — Assessment & Plan Note (Signed)
 Continues to have significant difficulty.  Has seen multiple providers for this.  I discussed with patient about icing regimen and home exercises, patient did not have some discoloration noted on exam today.  We discussed different medicines.  I do believe she is in great hands with the pain management doctor and do think the patch medication would be beneficial.  We did discuss Journavax as another potential but concerned that it is only approved for a 7-month duration. Patient will follow-up with the other provider and see what else.  I do feel that patient has had uric acid kidney stones and we will get a urinalysis as well for further evaluation and is awaiting urology

## 2024-05-17 NOTE — Assessment & Plan Note (Signed)
 Discussed with patient at great length, discussed icing regimen and home exercises, discussed which activities to do and which ones to avoid.  Increase activity slowly.  Discussed home exercises.  Tru pull lite brace given today to give some stability.  Follow-up again in 6 to 8 weeks

## 2024-05-17 NOTE — Assessment & Plan Note (Signed)
 Has had hematuria as well, awaiting urology which patient is going to see in November.

## 2024-05-17 NOTE — Patient Instructions (Addendum)
 Labs today I think the patch from the other physician is a great idea Next step cold be journavx See you again in 3-4 months

## 2024-05-18 ENCOUNTER — Encounter: Payer: Self-pay | Admitting: Family Medicine

## 2024-05-18 ENCOUNTER — Ambulatory Visit: Payer: Self-pay | Admitting: Family Medicine

## 2024-05-22 ENCOUNTER — Other Ambulatory Visit: Payer: Self-pay | Admitting: Family Medicine

## 2024-05-22 ENCOUNTER — Ambulatory Visit (HOSPITAL_BASED_OUTPATIENT_CLINIC_OR_DEPARTMENT_OTHER): Admitting: Physical Therapy

## 2024-05-22 ENCOUNTER — Encounter: Payer: Self-pay | Admitting: Psychology

## 2024-05-22 ENCOUNTER — Encounter: Admitting: Psychology

## 2024-05-22 DIAGNOSIS — F419 Anxiety disorder, unspecified: Secondary | ICD-10-CM | POA: Diagnosis not present

## 2024-05-22 DIAGNOSIS — G894 Chronic pain syndrome: Secondary | ICD-10-CM

## 2024-05-22 DIAGNOSIS — G90522 Complex regional pain syndrome I of left lower limb: Secondary | ICD-10-CM | POA: Diagnosis not present

## 2024-05-22 DIAGNOSIS — F331 Major depressive disorder, recurrent, moderate: Secondary | ICD-10-CM | POA: Diagnosis not present

## 2024-05-22 DIAGNOSIS — J3089 Other allergic rhinitis: Secondary | ICD-10-CM

## 2024-05-24 ENCOUNTER — Encounter: Payer: Self-pay | Admitting: Pulmonary Disease

## 2024-05-24 ENCOUNTER — Ambulatory Visit: Admitting: Pulmonary Disease

## 2024-05-24 VITALS — BP 136/86 | HR 83 | Temp 97.6°F | Ht 63.0 in | Wt 205.2 lb

## 2024-05-24 DIAGNOSIS — R053 Chronic cough: Secondary | ICD-10-CM

## 2024-05-24 DIAGNOSIS — Z9189 Other specified personal risk factors, not elsewhere classified: Secondary | ICD-10-CM

## 2024-05-24 DIAGNOSIS — R0683 Snoring: Secondary | ICD-10-CM | POA: Diagnosis not present

## 2024-05-24 DIAGNOSIS — R0602 Shortness of breath: Secondary | ICD-10-CM

## 2024-05-24 DIAGNOSIS — G90522 Complex regional pain syndrome I of left lower limb: Secondary | ICD-10-CM

## 2024-05-24 LAB — PULMONARY FUNCTION TEST
DL/VA % pred: 124 %
DL/VA: 5.47 ml/min/mmHg/L
DLCO unc % pred: 101 %
DLCO unc: 21.1 ml/min/mmHg
FEF 25-75 Post: 2.32 L/s
FEF 25-75 Pre: 2.45 L/s
FEF2575-%Change-Post: -5 %
FEF2575-%Pred-Post: 78 %
FEF2575-%Pred-Pre: 82 %
FEV1-%Change-Post: 0 %
FEV1-%Pred-Post: 82 %
FEV1-%Pred-Pre: 83 %
FEV1-Post: 2.37 L
FEV1-Pre: 2.38 L
FEV1FVC-%Change-Post: 0 %
FEV1FVC-%Pred-Pre: 98 %
FEV6-%Change-Post: -2 %
FEV6-%Pred-Post: 82 %
FEV6-%Pred-Pre: 85 %
FEV6-Post: 2.86 L
FEV6-Pre: 2.95 L
FEV6FVC-%Pred-Post: 102 %
FEV6FVC-%Pred-Pre: 102 %
FVC-%Change-Post: 0 %
FVC-%Pred-Post: 83 %
FVC-%Pred-Pre: 83 %
FVC-Post: 2.93 L
FVC-Pre: 2.95 L
Post FEV1/FVC ratio: 81 %
Post FEV6/FVC ratio: 100 %
Pre FEV1/FVC ratio: 81 %
Pre FEV6/FVC Ratio: 100 %
RV % pred: 98 %
RV: 1.57 L
TLC % pred: 88 %
TLC: 4.34 L

## 2024-05-24 NOTE — Patient Instructions (Signed)
 History of Present Illness Caitlin Bryant is a 43 year old female who presents with a persistent cough after COVID-19.  She has been experiencing a persistent cough following a COVID-19 infection. The use of Qvar  has been effective in alleviating the cough, and she no longer experiences it.  She has a history of complex regional pain syndrome (CRPS), which has been exacerbating recently. She experiences significant pain and sensitivity, particularly at night, affecting her sleep. She needs to sleep in specific positions to avoid pain, such as on her stomach with her foot hanging off the bed. Despite taking gabapentin , she continues to have issues with pain and sleep, often waking up feeling like her arm and face are 'on fire.'  She has a history of mycoplasma pneumonia, which was identified in past blood work. She was surprised by this diagnosis as she thought she only had a cold at the time.  Regarding her sleep, she acknowledges snoring and sometimes waking herself up due to it. She feels tired and achy in the mornings but manages to function without coffee, sometimes relying on soda for caffeine . She occasionally takes naps in the afternoon.  Assessment and Plan Complex regional pain syndrome (CRPS) CRPS is currently exacerbating, causing significant pain and sleep disturbances. Pain is described as burning sensations in the arm and face, with sensitivity to certain positions during sleep. - Will consider sleep study to evaluate for potential sleep disorder contributing to pain exacerbation.  Cough variant asthma (suspected) Suspected cough variant asthma, previously managed with Qvar , which has been effective in reducing cough symptoms. No current cough reported, but potential for recurrence exists. - Keep Qvar  available for use if cough recurs.  Sleep disturbance (suspected sleep disorder) Sleep disturbance is suspected, potentially contributing to CRPS exacerbation. Reports of snoring  and waking up due to snoring, but no observed apneas. Persistent fatigue and achiness suggest possible sleep disorder. - Ordered home sleep study to evaluate for sleep disorder.  History of mycoplasma pneumonia Mycoplasma pneumonia was identified in past testing, but no current symptoms or treatment required.

## 2024-05-24 NOTE — Patient Instructions (Signed)
 Full PFT completed today ? ?

## 2024-05-24 NOTE — Progress Notes (Signed)
 Subjective:    Patient ID: Caitlin Bryant, female    DOB: 12-09-1980, 43 y.o.   MRN: [id number]  Patient Care Team: Chandra Toribio POUR, MD as PCP - General [facility])  Chief Complaint  Patient presents with   Shortness of Breath    Shortness of breath on exertion. Cough has improved.     BACKGROUND/INTERVAL:Patient is a 43 year old lifelong never smoker who presents for follow-up of a dry nonproductive cough present since April 2025.  She was initially evaluated here on 16 March 2024.  She had PFTs today.  This is scheduled follow-up visit.  HPI Discussed the use of AI scribe software for clinical note transcription with the patient, who gave verbal consent to proceed.  History of Present Illness   Caitlin Bryant is a 43 year old female who presents with a persistent cough after COVID-19.  She has been experiencing a persistent cough following a COVID-19 infection. The use of Qvar  has been effective in alleviating the cough, and she no longer experiences it.  She discontinued use of Qvar  as her cough subsided.  She has a history of complex regional pain syndrome (CRPS), which has been exacerbating recently. She experiences significant pain and sensitivity, particularly at night, affecting her sleep. She needs to sleep in specific positions to avoid pain, such as on her stomach with her foot hanging off the bed. Despite taking gabapentin , she continues to have issues with pain and sleep, often waking up feeling like her arm and face are 'on fire.'  She has a history of mycoplasma pneumonia, which was identified in past blood work. She was surprised by this diagnosis as she thought she only had a cold at the time.  Regarding her sleep, she acknowledges snoring and sometimes waking herself up due to it. She feels tired and achy in the mornings but manages to function without coffee, sometimes relying on soda for caffeine . She occasionally takes naps in the afternoon.    Pulmonary function test showed an FEV1 of 2.38 L or 83% addicted, FVC of 2.95 L or 83% predicted, FEV1/FVC of 81%.  No significant bronchodilator response.  Lung volumes are normal.  ERV low consistent with obesity.  Diffusion capacity normal.  Overall normal pulmonary function with perhaps some minimal restriction suggested likely due to obesity.  She was evaluated by rheumatology for a positive ANA, testing came back negative so it was a false positive study.  DATA 01/10/2024 CXR PA and lateral: No active cardiopulmonary disease.  Perhaps some mild coarsened interstitial markings 01/22/2024 MRI brain: No acute abnormalities, there is mild left ethmoid sinus disease and deviated nasal septum to the left. 05/24/2024 PFTs:FEV1 of 2.38 L or 83% addicted, FVC of 2.95 L or 83% predicted, FEV1/FVC of 81%.  No significant bronchodilator response.  Lung volumes are normal.  ERV low consistent with obesity.  Diffusion capacity normal.    Review of Systems A 10 point review of systems was performed and it is as noted above otherwise negative.   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 05/24/2022   Complex regional pain syndrome i of left lower limb 05/24/2022   Obesity (BMI 35.0-39.9 without comorbidity)  06/25/2021   Disorder of lumbosacral intervertebral disc 06/15/2021   Chronic pain of left ankle 06/08/2021   Routine general medical examination at a health care facility 03/17/2021   Other fatigue 02/23/2021   Acute bilateral low back pain without sciatica 06/09/2019   Left knee pain 04/20/2018   Impacted cerumen of right ear 04/15/2017   Neck swelling 08/07/2016    Depression 07/24/2015   TMJ dysfunction 07/03/2015   Migraine 05/10/2015   Ankle instability, left 02/22/2015   Generalized anxiety disorder 01/31/2015   Allergic rhinitis 01/31/2015    Social History   Tobacco Use   Smoking status: Never   Smokeless tobacco: Never  Substance Use Topics   Alcohol use: Yes    Comment: 1 a month or less    Allergies  Allergen Reactions   Prazosin  Other (See Comments)   Sumatriptan  Nausea And Vomiting    Severe nausea and vomiting  Other Reaction(s): GI Intolerance   Sulfa Antibiotics Rash    Current Meds  Medication Sig   azelastine  (ASTELIN ) 0.1 % nasal spray USE 2 SPRAYS IN EACH NOSTRIL TWICE DAILY (Patient taking differently: Place 2 sprays into both nostrils as needed.)   cetirizine  (ZYRTEC ) 10 MG tablet TAKE 1 TABLET BY MOUTH DAILY   ciclopirox  (LOPROX ) 0.77 % cream Apply topically 2 (two) times daily.   fluticasone  (FLONASE ) 50 MCG/ACT nasal spray Place into both nostrils daily. (Patient taking differently: Place into both nostrils as needed.)   gabapentin  (NEURONTIN ) 100 MG capsule Take 1-3 capsules (100-300 mg total) by mouth at bedtime.   [name] 1/20 1-20 MG-MCG tablet Take 1 tablet by mouth daily.   montelukast  (SINGULAIR ) 10 MG tablet TAKE 1 TABLET BY MOUTH AT BEDTIME (Patient taking differently: Take 10 mg by mouth as needed.)   ondansetron  (ZOFRAN  ODT) 4 MG disintegrating tablet Take 1-2 tablets (4-8 mg total) by mouth every 8 (eight) hours as needed for nausea or vomiting.   rizatriptan  (MAXALT ) 10 MG tablet Take 1 tablet (10 mg total) by mouth as needed for migraine. May repeat in 2 hours if needed   Vitamin D , Ergocalciferol , (DRISDOL ) 1.25 MG (50000 UNIT) CAPS capsule TAKE 1 CAPSULE BY MOUTH ONCE WEEKLY    Immunization History  Administered Date(s) Administered   Influenza, Seasonal, Injecte, Preservative Fre 05/05/2023   Influenza,inj,Quad PF,6+ Mos 06/22/2017, 04/20/2018, 06/09/2019, 06/16/2021, 06/10/2022    Influenza-Unspecified 04/24/2021   PFIZER(Purple Top)SARS-COV-2 Vaccination 11/04/2019, 11/25/2019   Tdap 08/24/2012, 02/03/2023        Objective:     BP 136/86   Pulse 83   Temp 97.6 F (36.4 C) (Temporal)   Ht 5' 3 (1.6 m)   Wt 205 lb 3.2 oz (93.1 kg)   SpO2 98%   BMI 36.35 kg/m   SpO2: 98 %  GENERAL: Well-developed, obese woman, no acute distress.  Oral mucosa moist.  No thrush. HEAD: Normocephalic, atraumatic.  EYES: Pupils equal, round, reactive to light.  No scleral icterus.  MOUTH: Dentition intact, clear postnasal drip noted at posterior pharynx. NECK: Supple. No thyromegaly. Trachea midline. No JVD.  No adenopathy. PULMONARY: Good air entry bilaterally.  No adventitious sounds. CARDIOVASCULAR: S1 and S2. Regular rate and rhythm.  No rubs, murmurs or gallops heard. ABDOMEN: Obese otherwise benign. MUSCULOSKELETAL: No joint deformity, no clubbing, no edema.  NEUROLOGIC: No overt focal deficit, no gait disturbance, speech is fluent. SKIN: Intact,warm,dry. PSYCH: Mood and behavior normal.     05/24/2024   10:19 AM  Results of the Epworth flowsheet  Sitting and reading 2  Watching TV 3  Sitting, inactive in a public place (e.g. a theatre or a meeting) 3  As a passenger in a car for an hour without a break 3  Lying down to rest in the afternoon when circumstances permit 3  Sitting and talking to someone 0  Sitting quietly after a lunch without alcohol 3  In a car, while stopped for a few minutes in traffic 3  Total score 20     Assessment & Plan:     ICD-10-CM   1. Chronic cough - resolved  R05.3     2. At risk for sleep apnea  Z91.89 Home sleep test    3. Complex regional pain syndrome type 1 of left lower extremity  G90.522     4. Snoring  R06.83       Orders Placed This Encounter  Procedures   Home sleep test    Standing Status:   Future    Expected Date:   06/07/2024    Expiration Date:   05/24/2025    Where should this test be performed::    [facility] - Pulmonary   Discussion:    Complex regional pain syndrome (CRPS) CRPS is currently exacerbating, causing significant pain and sleep disturbances. Pain is described as burning sensations in the arm and face, with sensitivity to certain positions during sleep. - Will consider sleep study to evaluate for potential sleep disorder contributing to pain exacerbation.  Cough variant asthma (suspected) Suspected cough variant asthma, previously managed with Qvar , which has been effective in reducing cough symptoms. No current cough reported, but potential for recurrence exists. - Keep Qvar  available for use if cough recurs.  Sleep disturbance (suspected sleep disorder) Sleep disturbance is suspected, potentially contributing to CRPS exacerbation. Reports of snoring and waking up due to snoring, but no observed apneas. Persistent fatigue and achiness suggest possible sleep disorder. - Ordered home sleep study to evaluate for sleep disorder.  History of mycoplasma pneumonia Mycoplasma pneumonia was identified in past testing, but no current symptoms or treatment required.   Will see the patient in follow-up in 2 months time she is to contact us  prior to that time should any new difficulties arise.  Advised if symptoms do not improve or worsen, to please contact office for sooner follow up or seek emergency care.    I spent 34 minutes of dedicated to the care of this patient on the date of this encounter to include pre-visit review of records, face-to-face time with the patient discussing conditions above, post visit ordering of testing, clinical documentation with the electronic health record, making appropriate referrals as documented, and communicating necessary findings to members of the patients care team.     C. Leita Sanders, MD Advanced Bronchoscopy [facility]    *This note was generated using voice recognition software/Dragon and/or AI transcription  program.  Despite best efforts to proofread, errors can occur which can change the meaning. Any transcriptional errors that result from this process are unintentional and may not be fully corrected at the time of dictation.

## 2024-05-24 NOTE — Progress Notes (Signed)
 Full PFT completed today ? ?

## 2024-05-28 NOTE — Addendum Note (Signed)
 Addended by: HAYDEN LYE on: 05/28/2024 02:48 PM   Modules accepted: Level of Service

## 2024-05-28 NOTE — Progress Notes (Addendum)
 Neuropsychology / Psychotherapy Note  Jolynn DEL. [facility]  Physical Medicine and Rehabilitation    Patient: Caitlin Bryant  DOB: Mar 06, 1981  MRN: [id number]  Location:  [facility] for Pain [facility] Re[facility]  [address], [address] [place] [address] Dept: [phone]  Provider: Evalene DOROTHA Riff, PsyD Modality & Individuals Present: The patient was seen in-person, unaccompanied, by the provider in the PM&R outpatient clinic office.   Date of Service: 04/05/24 Start: 11 PM End: 12 PM Duration of Service:  60 min   Reason For Service/Background 03/22/24:The patient was referred for evaluation and treatment by her physiatrist, Dr. Murray, for concerns related to chronic pain and psychiatric symptoms. 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 Symptoms are reported to be developing over left upper extremity and well. Additional details regarding medical are noted below. Upon interview, the patient indicated that goals for treatment for her included having a little more confidence in myself, and get out there and do things.  Depression: Endorsed frequent low mood and declines in energy and concentration. She endorsed excessive guilt/negative self evaluation, reduced enjoyment, and significant changes in weight. This has been present at current levels since late last year, but also present to varying degrees the prior years. She denied any suicidal ideation or psychomotor slowing.   Anxiety: Currently experiences active and significant difficulties with anxiety. Endorsed experiences of excessive/difficult to control worry, muscle tension, fatigue, difficulty concentrating, irritability, and (when leaving the home) feeling on-edge.  Panic: Experiences panic attacks approximately twice per month. Onset in adulthood. Denied  indications of panic disorder.  Trauma Hx/PTSD Sx: Endorsed history of trauma but did not feel comfortable discussing this during the current visit (possibly because of the presence of family), but said she would be willing to discuss it at a future visit.  Mania/Hypomania: Denied any indications of mania/hypomania past or present. Hallucination: None, past or present.  Paranoia: None, past or present.  Thought disorder/Psychosis: None, past or present.  OCD: None, past or present.  Substance Use: None, past or present. Very rare alcohol consumption.  Suicidal Ideation: None, past or present.  Homicidal Ideation: None, past or present.  Risk Factors/Safety Concerns: None. Future oriented. No previous history of current ideation. No SU, Not socially isolated, no recent deaths of loved ones, family is protective factor.  Sleep: Reported trying to obtain about 8 hours of sleep per night. Reported not feeling rested in the morning. Sometimes takes up to an hour to return to sleep at times. Denied significant problems with onset. Denied frequent nightmares. Sleep difficulties have been present for multiple years but lack of feeling rested in the morning has been over the last few months.  Appetite: Increase in appetite. Reported weight gain since time of injury. n Caffeine : Rare Psychiatric Treatment History: Has trialed antidepressant medications; was previously on zoloft  (2021) and has been on duloxetine  for about 8-12 months. She has never engaged in individual therapy.  Psychosocial Stressors: Identified health problems as her primary stressor. Eluded to the presence of significant familial stressors around 2021 but pt did not wish to discuss them at this time.   Tx Plan / Modality Patient Stated Goal: improve overall confidence levels and activity levels.  Reduction of psychiatric symptoms (anxiety/panic, depression) Reduction of chronic pain related interference on activity levels.   Intervention: Brief CBT for Chronic pain;  psychoeducation, behavioral, CBT, and relaxation/mindfulness based interventions. CBT (General) for depression and anxiety.  Modality: Individual therapy. Weekly (possibly every-other week due to patient's availability) Visits will be in-person, 60 minute duration  Session Content:  Themes/Topics Discussed/Subjective Reports: Explored anxiety related to future trip to beach. Reinforcing behavioral activation. Exploration of anticipatory anxiety and validity based on past experiences of anticipating the worst.   Session utilized Brief Cognitive Behavioral Therapy for Chronic Pain: Therapist Manual, Ver 2.0,(Beehler, G. P., Beverley DOROTHA FREDRIK Myrna MYRTIS JONELLE., & Dollar, K. M. (2021))   Psychoeducation regarding chronic pain: differences b/t acute vs. Chronic. Biopsychosocial conceptualization. Impacts on life. Ways in which cycle of distress/decline can be maintained, and interrupted. Goal setting.   PEG (past week) Average pain level: 5 of 10 (10 = higher pain) Pain interference of life enjoyment 6 of 10 (10 = higher interference) Pain interference of general activity 7 of 10 (10 = higher interference)   Explored exacerbatory and ameliorating factors in general, and specific to patient.  - Utilizes distraction, spending time with family/friends. Shows some tendencies to utilize pacing and relaxation techniques.   Out-of-Session Tasks: Provided additional materials for review out of session including goal setting exercise.   Observations: Alert, cooperative, attentive and engaged. Anxious+dysphoric at times, but some incongruence / coping via laughter and smiling. Negative self talk and range of cognitive interpretation biases present.   Therapeutic Interventions/Techniques Used: Provided validation/supportive counseling techniques, psychoeducation, utilized cognitive restructuring techniques to limited extend.    Response to Interventions & Barriers to  Progress or Engagement: Tendency to adhere to viewpoint that worry is beneficial. Will need to revisit. Demonstrated understanding of the material covered. Showed engagement in some positive coping skills already in daily life for managing pain (Trying to remain active, but utilizing some pacing, trying to focus on positive).   Safety Concerns/Risk: No SI/HI, no Hx of attempts/ideation.    Goals focused on this session: Anxiety. Self-efficacy. Introduction to chronic pain - biopsychosocial conceptualization, connection to pt current life examples, rational for the intervention etc.   Plan/Goals for Next Session:  -Module 2 chronic pain treatment.  -Follow-up on outcome of goal setting exercise/assignment.  -Psychoeducation.   Additional topics to address: Worry/anxious rumination, safety behaviors, self-efficacy, identification of rapid automatic negative thoughts, identification of source/adapt to present vs. Hx learned tendencies, validation/support/normalization.    Diagnosis:  Major depressive disorder, recurrent episode, moderate (HCC) [F33.1]  Anxiety disorder, unspecified type [F41.9] (r/o PTSD, agoraphobia) Complex pain syndrome  Evalene DOROTHA Riff, PsyD Clinical Psychologist / Neuropsychologist  This report was generated using voice recognition software. While this document has been carefully reviewed, transcription errors may be present. I apologize in advance for any inconvenience. Please contact me if further clarification is needed.

## 2024-05-28 NOTE — Progress Notes (Addendum)
 Neuropsychology / Psychotherapy Note  Jolynn DEL. [facility]  Physical Medicine and Rehabilitation    Patient: Caitlin Bryant  DOB: 03/29/1981  MRN: [id number]  Location:  [facility] for Pain and Rehabilitative Medicine  [address], [address] [place] [address] Dept: [phone]  Provider: Evalene DOROTHA Riff, PsyD Modality & Individuals Present: The patient was seen in-person, unaccompanied, by the provider in the PM&R outpatient clinic office.   Date of Service: 05/03/24 Start: 1 PM End: 2 PM Duration of Service:  60 min   Reason For Service/Background 03/22/24:The patient was referred for evaluation and treatment by her physiatrist, Dr. Murray, for concerns related to chronic pain and psychiatric symptoms. 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 Symptoms are reported to be developing over left upper extremity and well. Additional details regarding medical are noted below. Upon interview, the patient indicated that goals for treatment for her included having a little more confidence in myself, and get out there and do things.  Depression: Endorsed frequent low mood and declines in energy and concentration. She endorsed excessive guilt/negative self evaluation, reduced enjoyment, and significant changes in weight. This has been present at current levels since late last year, but also present to varying degrees the prior years. She denied any suicidal ideation or psychomotor slowing.   Anxiety: Currently experiences active and significant difficulties with anxiety. Endorsed experiences of excessive/difficult to control worry, muscle tension, fatigue, difficulty concentrating, irritability, and (when leaving the home) feeling on-edge.  Panic: Experiences panic attacks approximately twice per month. Onset in adulthood. Denied  indications of panic disorder.  Trauma Hx/PTSD Sx: Endorsed history of trauma but did not feel comfortable discussing this during the current visit (possibly because of the presence of family), but said she would be willing to discuss it at a future visit.  Mania/Hypomania: Denied any indications of mania/hypomania past or present. Hallucination: None, past or present.  Paranoia: None, past or present.  Thought disorder/Psychosis: None, past or present.  OCD: None, past or present.  Substance Use: None, past or present. Very rare alcohol consumption.  Suicidal Ideation: None, past or present.  Homicidal Ideation: None, past or present.  Risk Factors/Safety Concerns: None. Future oriented. No previous history of current ideation. No SU, Not socially isolated, no recent deaths of loved ones, family is protective factor.  Sleep: Reported trying to obtain about 8 hours of sleep per night. Reported not feeling rested in the morning. Sometimes takes up to an hour to return to sleep at times. Denied significant problems with onset. Denied frequent nightmares. Sleep difficulties have been present for multiple years but lack of feeling rested in the morning has been over the last few months.  Appetite: Increase in appetite. Reported weight gain since time of injury. n Caffeine : Rare Psychiatric Treatment History: Has trialed antidepressant medications; was previously on zoloft  (2021) and has been on duloxetine  for about 8-12 months. She has never engaged in individual therapy.  Psychosocial Stressors: Identified health problems as her primary stressor. Eluded to the presence of significant familial stressors around 2021 but pt did not wish to discuss them at this time.   Tx Plan / Modality Patient Stated Goal: improve overall confidence levels and activity levels.  Reduction of psychiatric symptoms (anxiety/panic, depression) Reduction of chronic pain related interference on activity levels.   Intervention: Brief CBT for Chronic pain;  psychoeducation, behavioral, CBT, and relaxation/mindfulness based interventions. CBT (General) for depression and anxiety. CBT and mindfulness/relaxation for anxiety.  Modality: Individual therapy. Weekly (possibly every-other week due to patient's availability) Visits will be in-person, 60 minute duration  Session Content:  Themes/Topics Discussed/Subjective Reports: Anx increased. Ruminative on medical decision related to care of daughter. Explored/processed this, connection to self-worth/responsibility, and pragmatic utility. Praised instances of positive boundary related behaviors, and exploring difficulties with self/regulation and praised insight into recognition of problems from acting based on emotions alone. Explored patient's self-perspectives. Difficulties viewing self positively or identifying any positive traits. 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 Observed connection b/t past and self-esteem and encouraged intentional reflection on positive characterstics rather than dismissing them. Explored tendency to blame self for external factors based on prior work situation. Provided validation and support. Examined chronic pain scores, noting increase in interference but decrease in pain levels.    PEG (past week) Average pain level: 5 of 10 (10 = higher pain) (decreased) Pain interference of life enjoyment 7 of 10 (10 = higher interference) (increased) Pain interference of general activity 7 of 10 (10 = higher interference) (increased)  Collected additional data regarding psychiatric Sx.  BAI - Score of 42 - Severe range (anxiety)  Out-of-Session Tasks: Followed through with relaxation strategies.   Observations: Alert, cooperative, attentive and engaged. Anxious+dysphoric at times, but some incongruence / coping via  laughter and smiling. Negative self talk and range of cognitive interpretation biases present. Indications of elevated somatic sensitivity and catastrophic interpretation of physical sensations. Interacting problematically with rumination.   Therapeutic Interventions/Techniques Used: CBT, mindfulness/relaxation, psychoeducation and supportive.    Response to Interventions & Barriers to Progress or Engagement: Demonstrated understanding of the material covered. Generally resilient and persistent traits. Tendency to adhere to viewpoint that worry is beneficial persists, but patient recognizes feeling driven decision making is not ideal (although perspective primarily regarding her actions which impact others, may benefit from directing this more inward).   Safety Concerns/Risk: No SI/HI, no Hx of attempts/ideation.    Goals focused on this session: Chronic pain, anxiety, behavioral strategies and CBT with psychoeducation.   Plan/Goals for Next Session:  -Anxiety/rumination, somatic Sx interpretation, self-esteem/efficacy and relationships.  -Safety behaviors.  -Psychoeducation.    Additional topics to address: Worry/anxious rumination, safety behaviors, self-efficacy, identification of rapid automatic negative thoughts, identification of source/adapt to present vs. Hx learned tendencies, validation/support/normalization.    Diagnosis:  Major depressive disorder, recurrent episode, moderate (HCC) [F33.1]  Anxiety disorder, unspecified type [F41.9] (r/o PTSD, agoraphobia) Complex pain syndrome  Evalene DOROTHA Riff, PsyD Clinical Psychologist / Neuropsychologist  This report was generated using voice recognition software. While this document has been carefully reviewed, transcription errors may be present. I apologize in advance for any inconvenience. Please contact me if further clarification is needed.

## 2024-05-28 NOTE — Progress Notes (Signed)
 Neuropsychology / Psychotherapy Note  Jolynn DEL. [facility]  Physical Medicine and Rehabilitation    Patient: Caitlin Bryant  DOB: [birth year]  MRN: [id number]  Location:  [facility] for Pain and Rehabilitative Medicine  [address] [place] [address] Dept: [phone]  Provider: Evalene DOROTHA Riff, PsyD Modality & Individuals Present: The patient was seen in-person, unaccompanied, by the provider in the PM&R outpatient clinic office.   Date of Service: 05/22/24 Start: 2 PM End: 3 PM Duration of Service:  60 min   Reason For Service/Background 03/22/24:The patient was referred for evaluation and treatment by her physiatrist, Dr. Murray, for concerns related to chronic pain and psychiatric symptoms. 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 Symptoms are reported to be developing over left upper extremity and well. Additional details regarding medical are noted below. Upon interview, the patient indicated that goals for treatment for her included having a little more confidence in myself, and get out there and do things.  Depression: Endorsed frequent low mood and declines in energy and concentration. She endorsed excessive guilt/negative self evaluation, reduced enjoyment, and significant changes in weight. This has been present at current levels since late last year, but also present to varying degrees the prior years. She denied any suicidal ideation or psychomotor slowing.   Anxiety: Currently experiences active and significant difficulties with anxiety. Endorsed experiences of excessive/difficult to control worry, muscle tension, fatigue, difficulty concentrating, irritability, and (when leaving the home) feeling on-edge.  Panic: Experiences panic attacks approximately twice per month. Onset in adulthood. Denied  indications of panic disorder.  Trauma Hx/PTSD Sx: Endorsed history of trauma but did not feel comfortable discussing this during the current visit (possibly because of the presence of family), but said she would be willing to discuss it at a future visit.  Mania/Hypomania: Denied any indications of mania/hypomania past or present. Hallucination: None, past or present.  Paranoia: None, past or present.  Thought disorder/Psychosis: None, past or present.  OCD: None, past or present.  Substance Use: None, past or present. Very rare alcohol consumption.  Suicidal Ideation: None, past or present.  Homicidal Ideation: None, past or present.  Risk Factors/Safety Concerns: None. Future oriented. No previous history of current ideation. No SU, Not socially isolated, no recent deaths of loved ones, family is protective factor.  Sleep: Reported trying to obtain about 8 hours of sleep per night. Reported not feeling rested in the morning. Sometimes takes up to an hour to return to sleep at times. Denied significant problems with onset. Denied frequent nightmares. Sleep difficulties have been present for multiple years but lack of feeling rested in the morning has been over the last few months.  Appetite: Increase in appetite. Reported weight gain since time of injury. n Caffeine : Rare Psychiatric Treatment History: Has trialed antidepressant medications; was previously on zoloft  (2021) and has been on duloxetine  for about 8-12 months. She has never engaged in individual therapy.  Psychosocial Stressors: Identified health problems as her primary stressor. Eluded to the presence of significant familial stressors around 2021 but pt did not wish to discuss them at this time.   Tx Plan / Modality Patient Stated Goal: improve overall confidence levels and activity levels.  Reduction of psychiatric symptoms (anxiety/panic, depression) Reduction of chronic pain related interference on activity levels.   Intervention: Brief CBT for Chronic pain;  psychoeducation, behavioral, CBT, and relaxation/mindfulness based interventions. CBT (General) for depression and anxiety. CBT and mindfulness/relaxation for anxiety.  Modality: Individual therapy. Weekly (possibly every-other week due to patient's availability) Visits will be in-person, 60 minute duration  Session Content:  Themes/Topics Discussed/Subjective Reports:  - Distress from acute pain event impacting beyond event itself.  - Positive interactions with family.  - Trying to cope with increased sedation from medications.  - Concerns/fears and what-if and worst case scenario rumination highlighted/observed by provider in session. Working to help patient identify cognitive bias tendencies etc.  - Father in law living on land. Negative interactions. Demonstrated assertiveness for self. Provider reinforced.  - Exploring acute reactions (cognition) to pain. Exploring effictiveness of relaxation start. Provided psychoed and recommendations around practicing strats. Praised patient use of (when she has been able).  - Processed and identified patient safety/reassurance seeking from external sources (I.e., family). Big insight increase as patient recognized connection between prior interventions with her daughter's therapy work when younger. Discussed desire for reassurance being inwardly derived, linked to self-confidence.   Out-of-Session Tasks: Practice relaxation. Process / digest big insight from current session regarding reassurance seeking.   Observations: Alert, cooperative, attentive and engaged. Reduced anxiety relative to prior session, mood seemed slightly better. Discussion regarding safety-seeking/reassurance behavior and subsequent insight seemed impactful in a positive way.   Therapeutic Interventions/Techniques Used: CBT, mindfulness/relaxation, psychoeducation, insight.     Response to Interventions & Barriers to Progress or  Engagement: Demonstrated understanding of the material covered. Positive progress in gaining awareness of anxiety perpetuating behaviors.   Safety Concerns/Risk: No SI/HI, no Hx of attempts/ideation.    Goals focused on this session: Chronic pain, anxiety, behavioral strategies and CBT with psychoeducation.   Plan/Goals for Next Session:  -Anxiety/rumination/safety behaviors. Explore for connection to interpretation of somatic experiences.  -Psychoeducation.    Additional topics to address: Worry/anxious rumination, safety behaviors, self-efficacy, identification of rapid automatic negative thoughts, identification of source/adapt to present vs. Hx learned tendencies, validation/support/normalization.    Diagnosis:  Major depressive disorder, recurrent episode, moderate (HCC) [F33.1]  Anxiety disorder, unspecified type [F41.9] (r/o PTSD, agoraphobia) Complex pain syndrome  Evalene DOROTHA Riff, PsyD Clinical Psychologist / Neuropsychologist  This report was generated using voice recognition software. While this document has been carefully reviewed, transcription errors may be present. I apologize in advance for any inconvenience. Please contact me if further clarification is needed.

## 2024-05-28 NOTE — Progress Notes (Signed)
 Neuropsychology / Psychotherapy Note  Jolynn DEL. [facility]  Physical Medicine and Rehabilitation    Patient: Caitlin Bryant  DOB: [birth year]  MRN: [id number]  Location:  [facility]  [address], [address] [place] [address] Dept: [phone]  Provider: Evalene DOROTHA Riff, PsyD Modality & Individuals Present: The patient was seen in-person, unaccompanied, by the provider in the PM&R outpatient clinic office.   Date of Service: 04/26/24 Start: 11 PM End: 12 PM Duration of Service:  60 min   Reason For Service/Background 03/22/24:The patient was referred for evaluation and treatment by her physiatrist, Dr. Murray, for concerns related to chronic pain and psychiatric symptoms. 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 Symptoms are reported to be developing over left upper extremity and well. Additional details regarding medical are noted below. Upon interview, the patient indicated that goals for treatment for her included having a little more confidence in myself, and get out there and do things.  Depression: Endorsed frequent low mood and declines in energy and concentration. She endorsed excessive guilt/negative self evaluation, reduced enjoyment, and significant changes in weight. This has been present at current levels since late last year, but also present to varying degrees the prior years. She denied any suicidal ideation or psychomotor slowing.   Anxiety: Currently experiences active and significant difficulties with anxiety. Endorsed experiences of excessive/difficult to control worry, muscle tension, fatigue, difficulty concentrating, irritability, and (when leaving the home) feeling on-edge.  Panic: Experiences panic attacks approximately twice per month. Onset in adulthood. Denied  indications of panic disorder.  Trauma Hx/PTSD Sx: Endorsed history of trauma but did not feel comfortable discussing this during the current visit (possibly because of the presence of family), but said she would be willing to discuss it at a future visit.  Mania/Hypomania: Denied any indications of mania/hypomania past or present. Hallucination: None, past or present.  Paranoia: None, past or present.  Thought disorder/Psychosis: None, past or present.  OCD: None, past or present.  Substance Use: None, past or present. Very rare alcohol consumption.  Suicidal Ideation: None, past or present.  Homicidal Ideation: None, past or present.  Risk Factors/Safety Concerns: None. Future oriented. No previous history of current ideation. No SU, Not socially isolated, no recent deaths of loved ones, family is protective factor.  Sleep: Reported trying to obtain about 8 hours of sleep per night. Reported not feeling rested in the morning. Sometimes takes up to an hour to return to sleep at times. Denied significant problems with onset. Denied frequent nightmares. Sleep difficulties have been present for multiple years but lack of feeling rested in the morning has been over the last few months.  Appetite: Increase in appetite. Reported weight gain since time of injury. n Caffeine : Rare Psychiatric Treatment History: Has trialed antidepressant medications; was previously on zoloft  (2021) and has been on duloxetine  for about 8-12 months. She has never engaged in individual therapy.  Psychosocial Stressors: Identified health problems as her primary stressor. Eluded to the presence of significant familial stressors around 2021 but pt did not wish to discuss them at this time.   Tx Plan / Modality Patient Stated Goal: improve overall confidence levels and activity levels.  Reduction of psychiatric symptoms (anxiety/panic, depression) Reduction of chronic pain related interference on activity levels.   Intervention: Brief CBT for Chronic pain;  psychoeducation, behavioral, CBT, and relaxation/mindfulness based interventions. CBT (General) for depression and anxiety. CBT and mindfulness/relaxation for anxiety.  Modality: Individual therapy. Weekly (possibly every-other week due to patient's availability) Visits will be in-person, 60 minute duration  Session Content:  Themes/Topics Discussed/Subjective Reports: Went with family outing to beach. Discussed patient struggles with physical pain, examples of over-extending/cost, positives and ways to utilize experience adaptively. Praised patient's future orientation. Observed to patient examples of cognitive bias in interpretations towards negative outcomes potentially increasing anxiety. Provided psychoeducaiton. Observed disconnection b/t pain levels and ability to enjoy activities (See PEG), reinforcing aspects of CBT therapy for chronic pain rationale. Reinforced adaptive coping strategies currently utilized by patient.   Session utilized Brief Cognitive Behavioral Therapy for Chronic Pain: Therapist Manual, Ver 2.0,(Beehler, G. P., Beverley DOROTHA FREDRIK Myrna MYRTIS JONELLE., & Dollar, K. M. (2021)), Module 2 - activities and pacing. Essentially utilizing moderation to reduce interference of pain on functioning from tendencies of over-extension and subsequent protracted recovery.    PEG (past week) Average pain level: 6 of 10 (10 = higher pain) Pain interference of life enjoyment 4 of 10 (10 = higher interference) (decreased) Pain interference of general activity 5 of 10 (10 = higher interference) (decreased)  Collected additional data regarding psychiatric Sx.  BAI - Score of 42 - Severe range (anxiety)  Out-of-Session Tasks: Provided additional materials for review out of session including pacing and relaxation activities. Patient followed through on previous tasks.   Observations: Alert, cooperative, attentive and engaged. Anxious+dysphoric at times, but some  incongruence / coping via laughter and smiling. Negative self talk and range of cognitive interpretation biases present. Indications of elevated somatic sensitivity and catastrophic interpretation of physical sensations. Interacting problematically with rumination.   Therapeutic Interventions/Techniques Used: CBT, mindfulness/relaxation, psychoeducation and supportive.    Response to Interventions & Barriers to Progress or Engagement: Demonstrated understanding of the material covered. Showed engagement in some positive coping skills already in daily life for managing pain. Generally resilient and persistent traits. Tendency to adhere to viewpoint that worry is beneficial persists.   Safety Concerns/Risk: No SI/HI, no Hx of attempts/ideation.    Goals focused on this session: Chronic pain, anxiety, behavioral strategies and CBT with psychoeducation.   Plan/Goals for Next Session:  -Anxiety/rumination, somatic Sx interpretation, self-esteem/efficacy and relationships.  -Follow-up on outcome of relaxation strategy use (already using pacing).  -Psychoeducation.   Additional topics to address: Worry/anxious rumination, safety behaviors, self-efficacy, identification of rapid automatic negative thoughts, identification of source/adapt to present vs. Hx learned tendencies, validation/support/normalization.    Diagnosis:  Major depressive disorder, recurrent episode, moderate (HCC) [F33.1]  Anxiety disorder, unspecified type [F41.9] (r/o PTSD, agoraphobia) Complex pain syndrome  Evalene DOROTHA Riff, PsyD Clinical Psychologist / Neuropsychologist  This report was generated using voice recognition software. While this document has been carefully reviewed, transcription errors may be present. I apologize in advance for any inconvenience. Please contact me if further clarification is needed.

## 2024-05-31 ENCOUNTER — Ambulatory Visit (HOSPITAL_BASED_OUTPATIENT_CLINIC_OR_DEPARTMENT_OTHER): Attending: Physical Medicine & Rehabilitation | Admitting: Physical Therapy

## 2024-05-31 ENCOUNTER — Encounter (HOSPITAL_BASED_OUTPATIENT_CLINIC_OR_DEPARTMENT_OTHER): Payer: Self-pay | Admitting: Physical Therapy

## 2024-05-31 DIAGNOSIS — R202 Paresthesia of skin: Secondary | ICD-10-CM | POA: Insufficient documentation

## 2024-05-31 DIAGNOSIS — F331 Major depressive disorder, recurrent, moderate: Secondary | ICD-10-CM | POA: Diagnosis not present

## 2024-05-31 DIAGNOSIS — R2689 Other abnormalities of gait and mobility: Secondary | ICD-10-CM | POA: Insufficient documentation

## 2024-05-31 DIAGNOSIS — F419 Anxiety disorder, unspecified: Secondary | ICD-10-CM | POA: Diagnosis not present

## 2024-05-31 DIAGNOSIS — M79662 Pain in left lower leg: Secondary | ICD-10-CM | POA: Insufficient documentation

## 2024-05-31 DIAGNOSIS — M25572 Pain in left ankle and joints of left foot: Secondary | ICD-10-CM | POA: Insufficient documentation

## 2024-05-31 DIAGNOSIS — G894 Chronic pain syndrome: Secondary | ICD-10-CM | POA: Diagnosis not present

## 2024-05-31 NOTE — Therapy (Signed)
 OUTPATIENT PHYSICAL THERAPY LOWER EXTREMITY TREATMENT   Patient Name: Caitlin Bryant MRN: [id number] DOB:1981/06/27, 43 y.o., female Today's Date: 05/31/2024  END OF SESSION:  PT End of Session - 05/31/24 0848     Visit Number 2    Date for Recertification  07/07/24    PT Start Time 0842    PT Stop Time 0922    PT Time Calculation (min) 40 min    Behavior During Therapy [facility] for tasks assessed/performed          Past Medical History:  Diagnosis Date   Anxiety    Headache    MRSA (methicillin resistant staph aureus) culture positive    5 or more years ago, right thumb   Past Surgical History:  Procedure Laterality Date   ANKLE ARTHROSCOPY WITH RECONSTRUCTION Left 08/19/2021   Procedure: LEFT ANKLE ARTHROSCOPY, ARTHROSCOPIC TREATMENT OF TALUS OSTEOCHONDRAL LESION, LATERAL LIGAMENT RECONSTRUCTION, PERONEAL TENDON DEBRIDEMENT, REPAIR OF DISLOCATING PERONEAL TENDONS;  Surgeon: Elsa Lonni SAUNDERS, MD;  Location: [facility];  Service: Orthopedics;  Laterality: Left;  LENGTH OF SURGERY: 120 MINUTES   CESAREAN SECTION  2004 and 2008   x 2   DIAGNOSTIC LAPAROSCOPY     TONSILLECTOMY     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 05/24/2022   Complex regional pain syndrome i of left lower limb 05/24/2022   Obesity (BMI 35.0-39.9 without comorbidity) 06/25/2021   Disorder of lumbosacral intervertebral disc 06/15/2021   Chronic pain of left ankle 06/08/2021   Routine general medical examination at a health care facility 03/17/2021   Other fatigue 02/23/2021   Acute bilateral low back pain without sciatica 06/09/2019   Left knee pain  04/20/2018   Impacted cerumen of right ear 04/15/2017   Neck swelling 08/07/2016   Depression 07/24/2015   TMJ dysfunction 07/03/2015   Migraine 05/10/2015   Ankle instability, left 02/22/2015   Generalized anxiety disorder 01/31/2015   Allergic rhinitis 01/31/2015    PCP: Toribio Slain MD  REFERRING PROVIDER: Murray Loupe MD   REFERRING DIAG:  G90.522 (ICD-10-CM) - Complex regional pain syndrome type 1 of left lower extremity  G89.4 (ICD-10-CM) - Chronic pain syndrome    THERAPY DIAG:  Pain in left ankle and joints of left foot  Pain in left lower leg  Paresthesia of skin  Other abnormalities of gait and mobility  Rationale for Evaluation and Treatment: Rehabilitation  ONSET DATE: 2021  SUBJECTIVE:   SUBJECTIVE STATEMENT: Pt reports she knows how to swim. She has a pool, and her husband built a deck for ease of entry, but she has pain and difficulty climbing out of pool with ladder.   Initial statement: Have had lots of therapy but the land just hurts and has gotten progressively worse. Did 1 session of water therapy last year and wanted to try more. Was at neuro. CRPS is spreading. Husband present with her and has built a pool .  She is unable to climb up the ladder. CRPS started out in toes after surgery 2021 from left ankle reconstruction.   PERTINENT HISTORY: Leg pain, CRPS, consider aquatic therapy   CPRS LLE pain after L ankle tendon surgery 2022-she also  appears to be developing symptoms over her left upper extremity and likely right lower extremity   Are you having pain? Yes: NPRS scale: current 7/10;  Pain location: generalized - Lt toes and ankle up LLE and LUE fingertips to neck Pain description: burning, sharp; stabbing constant Aggravating factors: everything Relieving factors: LE elevation  PRECAUTIONS: None  RED FLAGS: None   WEIGHT BEARING RESTRICTIONS: No  FALLS:  Has patient fallen in last 6 months? No  OCCUPATION:  disability  PLOF: Needs assistance with ADLs, Needs assistance with homemaking, Needs assistance with gait, and Needs assistance with transfers  PATIENT GOALS: decreased pain and  more mobility  NEXT MD VISIT: as needed  OBJECTIVE:  Note: Objective measures were completed at Evaluation unless otherwise noted.   PATIENT SURVEYS:  LEFS 9/80  COGNITION: Overall cognitive status: Within functional limits for tasks assessed     SENSATION: CRPS  EDEMA:  Mild edema about left foot and ankle; left wrist    POSTURE: No Significant postural limitations  PALPATION: Superficial Touch increases nerve pain through LLE and lue through elbow  LUMBAR ROM wfl given extra time for movement into end range due to pain  LOWER EXTREMITY ROM:  Active ROM Right eval Left eval  Hip flexion full full  Hip extension    Hip abduction    Hip adduction    Hip internal rotation    Hip external rotation    Knee flexion    Knee extension    Ankle dorsiflexion    Ankle plantarflexion    Ankle inversion    Ankle eversion     (Blank rows = not tested)  LOWER EXTREMITY MMT:   P!=pain MMT Right eval Left eval  Hip flexion 4P! 3+P!  Hip extension    Hip abduction    Hip adduction    Hip internal rotation    Hip external rotation    Knee flexion 5 4  Knee extension 5 5  Ankle dorsiflexion 4+ 4  Ankle plantarflexion    Ankle inversion    Ankle eversion     (Blank rows = not tested)   FUNCTIONAL TESTS:  Timed up and go (TUG): 20  4 stage balance: Passed 1; assistance to gain semi tandem hold x7 s; Tandem and SLS not able to complete GAIT: Distance walked: 400 ft from lobby  Assistive device utilized: none Level of assistance: HHA husband Comments: walking on toes in slight knee flex                                                                                                                                TREATMENT  [facility] Adult PT Treatment:                                              Date:05/31/24  Pt seen for aquatic therapy today.  Treatment took place in water 3.5-4.75 ft in depth at the [facility] pool. Temp of water was 91.  Pt entered/exited the pool via stairs independently, slowly in step-to pattern with bil rail.  - reacquainting to aquatic therapy principles - UE resting on yellow noodle behind back (to support LUE out of water, during acclimation) walking forward/ backward, multiple laps - unsupported side stepping -> hands resting on short hollow noodle -> with gentle horz abdct/ add - UE on wall in 4+ ft:  gentle toe/heel raises x10; hip add/abd x 5; alternating hamstring curls x 5 each - tricep press down x 5 with short hollow noodles x 5 - wide stance with alternating row motion with short hollow noodles - seated on bench in water, ankle DF/PF, alternating LAQ, hip abdct/ add,  shoulder IR/ER with open palm/closed fist, elbow flexion/extension   Pt requires the buoyancy and hydrostatic pressure of water for support, and to offload joints by unweighting joint load by at least 50 % in navel deep water and by at least 75-80% in chest to neck deep water.  Viscosity of the water is needed for resistance of strengthening. Water current perturbations provides challenge to standing balance requiring increased core activation.     PATIENT EDUCATION:  Education details: intro to aquatic therapy   Person educated: Patient and Spouse Education method: Explanation Education comprehension: verbalized understanding  HOME EXERCISE PROGRAM: From prior land based session [id number]   ASSESSMENT:  CLINICAL IMPRESSION: Pt demonstrates safety and independence in aquatic setting with therapist instructing from deck. She is confident in setting, moving throughout all depths easily.  Pt is directed through various movement patterns and trials in both sitting and standing positions.   She is provided VC and demonstration throughout session for execution of  exercises while monitoring toleration. She reported overall reduction of pain to 5/10 during session, when submerged 80%.  Goals are ongoing. She was offered [facility] for husband to transport her after session, to avoid increase in symptoms during walk from pool to car.    Initial eval:  Patient is a 43 y.o. f who was seen today for physical therapy evaluation and treatment for CRPS left extremities. She is well known to [facility] rehab having been seen for episode ~ 1 year ago on land with 1 aquatic session for similar dx. She returns today with interest in a trial of aquatic intervention for improved function and pain management as land provided little to no results. CRPS reportedly appears to be spreading now including her LUE and right foot.   She presents with pain limited deficits in ROM, endurance, activity tolerance, gait, balance, and functional mobility with ADL's. She is a good candidate for aquatic intervention and will benefit from the properties of water to progress towards functional goals.    OBJECTIVE IMPAIRMENTS: Abnormal gait, decreased activity tolerance, decreased balance, decreased endurance, decreased knowledge of condition, decreased mobility, difficulty walking, decreased ROM, decreased strength, obesity, and pain.   ACTIVITY LIMITATIONS: carrying, lifting, sitting, standing, squatting, stairs, transfers, and bed mobility  PARTICIPATION LIMITATIONS: meal prep, cleaning, laundry, driving, shopping, community activity, and occupation  REHAB POTENTIAL: Good  CLINICAL DECISION MAKING: Evolving/moderate complexity  EVALUATION COMPLEXITY: Moderate   GOALS: Goals reviewed with patient? Yes  SHORT TERM GOALS: Target date: 10/17 Pt will tolerate full aquatic sessions consistently without increase in pain and with improving function to demonstrate good toleration and effectiveness of intervention.  Baseline: Goal status: INITIAL  2.  Pt will enter/exit pool using steps without  excessive pain or difficulty Baseline:  Goal status: IN PROGRESS - 05/31/24  3.  Pt will report a 25% reduction of pain while submerged as evidence of using properties of water for pain management Baseline:  Goal status: INITIAL   LONG TERM GOALS: Target date: 11/14  Pt to improve on LEFS by at least 9 point to demonstrate statistically significant Improvement in function. Baseline:LEFS 9/80 Goal status: INITIAL  2.  Pt will report decrease in pain by at least 25% for improved toleration to activity/quality of life and to demonstrate improved management of pain. Baseline:  Goal status: INITIAL  3.  Pt will improve strength in hips by 1 grade to demonstrate improved overall physical function. Baseline:  Goal status: INITIAL  4.  Pt will tolerate walking either to or from setting (419ft) and tolerate full aquatic sessions without increase in pain or significant fatigue to demonstrate improved amb toleration Baseline:  Goal status: INITIAL     PLAN:  PT FREQUENCY: 1-2x/week  PT DURATION: 8 weeks likely 12 sessions to determine benefit  PLANNED INTERVENTIONS: 97164- PT Re-evaluation, 97750- Physical Performance Testing, 97110-Therapeutic exercises, 97530- Therapeutic activity, 97112- Neuromuscular re-education, 97535- Self Care, 02859- Manual therapy, [id number]- Gait training, [phone]- Aquatic Therapy, [phone] (1-2 muscles), 20561 (3+ muscles)- Dry Needling, Patient/Family education, Balance training, Stair training, Taping, Joint mobilization, DME instructions, Cryotherapy, and Moist heat  PLAN FOR NEXT SESSION: aquatics only: pain management; gentle movement; strengthening and stretching as tolerated; balance retraining  Delon Aquas, PTA 05/31/24 9:47 AM [facility] [address] [place], [place], [address] Phone: [phone]   Fax:  [phone]

## 2024-06-02 ENCOUNTER — Encounter: Attending: Physical Medicine & Rehabilitation | Admitting: Psychology

## 2024-06-02 DIAGNOSIS — F419 Anxiety disorder, unspecified: Secondary | ICD-10-CM | POA: Diagnosis not present

## 2024-06-02 DIAGNOSIS — G894 Chronic pain syndrome: Secondary | ICD-10-CM | POA: Insufficient documentation

## 2024-06-02 DIAGNOSIS — F331 Major depressive disorder, recurrent, moderate: Secondary | ICD-10-CM | POA: Diagnosis not present

## 2024-06-05 ENCOUNTER — Ambulatory Visit

## 2024-06-06 ENCOUNTER — Ambulatory Visit (HOSPITAL_BASED_OUTPATIENT_CLINIC_OR_DEPARTMENT_OTHER): Admitting: Physical Therapy

## 2024-06-06 ENCOUNTER — Encounter (HOSPITAL_BASED_OUTPATIENT_CLINIC_OR_DEPARTMENT_OTHER): Payer: Self-pay | Admitting: Physical Therapy

## 2024-06-06 DIAGNOSIS — G894 Chronic pain syndrome: Secondary | ICD-10-CM | POA: Diagnosis not present

## 2024-06-06 DIAGNOSIS — F419 Anxiety disorder, unspecified: Secondary | ICD-10-CM | POA: Diagnosis not present

## 2024-06-06 DIAGNOSIS — M79662 Pain in left lower leg: Secondary | ICD-10-CM

## 2024-06-06 DIAGNOSIS — F331 Major depressive disorder, recurrent, moderate: Secondary | ICD-10-CM | POA: Diagnosis not present

## 2024-06-06 DIAGNOSIS — M25572 Pain in left ankle and joints of left foot: Secondary | ICD-10-CM

## 2024-06-06 DIAGNOSIS — R202 Paresthesia of skin: Secondary | ICD-10-CM

## 2024-06-06 NOTE — Therapy (Signed)
 OUTPATIENT PHYSICAL THERAPY LOWER EXTREMITY TREATMENT   Patient Name: Caitlin Bryant MRN: [id number] DOB:03-26-1981, 43 y.o., female Today's Date: 06/06/2024  END OF SESSION:  PT End of Session - 06/06/24 1707     Visit Number 3    Date for Recertification  07/07/24    PT Start Time 1700    PT Stop Time 1738    PT Time Calculation (min) 38 min    Activity Tolerance Patient limited by pain    Behavior During Therapy [facility] for tasks assessed/performed          Past Medical History:  Diagnosis Date   Anxiety    Headache    MRSA (methicillin resistant staph aureus) culture positive    5 or more years ago, right thumb   Past Surgical History:  Procedure Laterality Date   ANKLE ARTHROSCOPY WITH RECONSTRUCTION Left 08/19/2021   Procedure: LEFT ANKLE ARTHROSCOPY, ARTHROSCOPIC TREATMENT OF TALUS OSTEOCHONDRAL LESION, LATERAL LIGAMENT RECONSTRUCTION, PERONEAL TENDON DEBRIDEMENT, REPAIR OF DISLOCATING PERONEAL TENDONS;  Surgeon: Elsa Lonni SAUNDERS, MD;  Location: [facility];  Service: Orthopedics;  Laterality: Left;  LENGTH OF SURGERY: 120 MINUTES   CESAREAN SECTION  2004 and 2008   x 2   DIAGNOSTIC LAPAROSCOPY     TONSILLECTOMY     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 05/24/2022   Complex regional pain syndrome i of left lower limb 05/24/2022   Obesity (BMI 35.0-39.9 without comorbidity) 06/25/2021   Disorder of lumbosacral intervertebral disc 06/15/2021   Chronic pain of left ankle 06/08/2021   Routine general medical examination at a health care facility 03/17/2021   Other fatigue 02/23/2021   Acute bilateral low back pain without  sciatica 06/09/2019   Left knee pain 04/20/2018   Impacted cerumen of right ear 04/15/2017   Neck swelling 08/07/2016   Depression 07/24/2015   TMJ dysfunction 07/03/2015   Migraine 05/10/2015   Ankle instability, left 02/22/2015   Generalized anxiety disorder 01/31/2015   Allergic rhinitis 01/31/2015    PCP: Toribio Slain MD  REFERRING PROVIDER: Murray Loupe MD   REFERRING DIAG:  G90.522 (ICD-10-CM) - Complex regional pain syndrome type 1 of left lower extremity  G89.4 (ICD-10-CM) - Chronic pain syndrome    THERAPY DIAG:  Pain in left ankle and joints of left foot  Pain in left lower leg  Paresthesia of skin  Rationale for Evaluation and Treatment: Rehabilitation  ONSET DATE: 2021  SUBJECTIVE:   SUBJECTIVE STATEMENT: Pt reports she had increased pain and swelling in Lt hand, forearm and foot that night.  Pt reports she couldn't make fist with Lt hand.  Symptoms were still present next day, but not as bad.   Initial statement: Have had lots of therapy but the land just hurts and has gotten progressively worse. Did 1 session of water therapy last year and wanted to try more. Was at neuro. CRPS is spreading. Husband present with her and has built a pool .  She is unable to climb up the ladder. CRPS started out in toes after surgery 2021 from left ankle reconstruction.   PERTINENT HISTORY: Leg pain, CRPS, consider aquatic therapy   CPRS LLE pain after  L ankle tendon surgery 2022-she also appears to be developing symptoms over her left upper extremity and likely right lower extremity   Are you having pain? Yes: NPRS scale: current 7/10;  Pain location: generalized - Lt toes and ankle up LLE and LUE fingertips to neck Pain description: burning, sharp; stabbing constant Aggravating factors: everything Relieving factors: LE elevation  PRECAUTIONS: None  RED FLAGS: None   WEIGHT BEARING RESTRICTIONS: No  FALLS:  Has patient fallen in last 6 months?  No  OCCUPATION: disability  PLOF: Needs assistance with ADLs, Needs assistance with homemaking, Needs assistance with gait, and Needs assistance with transfers  PATIENT GOALS: decreased pain and  more mobility  NEXT MD VISIT: as needed  OBJECTIVE:  Note: Objective measures were completed at Evaluation unless otherwise noted.   PATIENT SURVEYS:  LEFS 9/80  COGNITION: Overall cognitive status: Within functional limits for tasks assessed     SENSATION: CRPS  EDEMA:  Mild edema about left foot and ankle; left wrist    POSTURE: No Significant postural limitations  PALPATION: Superficial Touch increases nerve pain through LLE and lue through elbow  LUMBAR ROM wfl given extra time for movement into end range due to pain  LOWER EXTREMITY ROM:  Active ROM Right eval Left eval  Hip flexion full full  Hip extension    Hip abduction    Hip adduction    Hip internal rotation    Hip external rotation    Knee flexion    Knee extension    Ankle dorsiflexion    Ankle plantarflexion    Ankle inversion    Ankle eversion     (Blank rows = not tested)  LOWER EXTREMITY MMT:   P!=pain MMT Right eval Left eval  Hip flexion 4P! 3+P!  Hip extension    Hip abduction    Hip adduction    Hip internal rotation    Hip external rotation    Knee flexion 5 4  Knee extension 5 5  Ankle dorsiflexion 4+ 4  Ankle plantarflexion    Ankle inversion    Ankle eversion     (Blank rows = not tested)   FUNCTIONAL TESTS:  Timed up and go (TUG): 20  4 stage balance: Passed 1; assistance to gain semi tandem hold x7 s; Tandem and SLS not able to complete GAIT: Distance walked: 400 ft from lobby  Assistive device utilized: none Level of assistance: HHA husband Comments: walking on toes in slight knee flex                                                                                                                                TREATMENT  [facility] Adult PT Treatment:  Date:06/06/24  Pt seen for aquatic therapy today.  Treatment took place in water 3.5-4.75 ft in depth at the [facility] pool. Temp of water was 91.  Pt entered/exited the pool via stairs independently, slowly in step-to pattern with bil rail.  - UE resting on yellow noodle behind back entire session(to support LUE out of water): - walking forward/ backward, Feels better facing hot tub; side stepping R/L partial width - supported back float with yellow noodle under arms behind back, blue under knees, nekdoodle -> hand open/closed, ankle pumps x 8-10 each - gentle toe/heel raises x10 (R hand on wall); hip add/abd 2 x 5; alternating hamstring curls x 5 each - squatted rest period - additional UE support on white noodle in front of her - return to gently walking forward/ backward -> standing with UE row -> gentle twists - stopped -increased burning in feet.   Pt requires the buoyancy and hydrostatic pressure of water for support, and to offload joints by unweighting joint load by at least 50 % in navel deep water and by at least 75-80% in chest to neck deep water.  Viscosity of the water is needed for resistance of strengthening. Water current perturbations provides challenge to standing balance requiring increased core activation.     PATIENT EDUCATION:  Education details: intro to aquatic therapy   Person educated: Patient and Spouse Education method: Explanation Education comprehension: verbalized understanding  HOME EXERCISE PROGRAM: From prior land based session [id number]   ASSESSMENT:  CLINICAL IMPRESSION: Dialed back intensity of exercise from last session as she had reported increased nausea and pain following session.  She reported overall reduction of pain to 5/10 during session, when submerged 80%. She did report increased surge of burning in feet after walking and trunk rotation; pt had been standing in pool ~35 min. Lt hand did NOT tolerate  water today; rested on noodle during session.   Goals are ongoing. She used WC for husband to transport her after session, to avoid increase in symptoms during walk from pool to car.    Initial eval:  Patient is a 43 y.o. f who was seen today for physical therapy evaluation and treatment for CRPS left extremities. She is well known to [facility] rehab having been seen for episode ~ 1 year ago on land with 1 aquatic session for similar dx. She returns today with interest in a trial of aquatic intervention for improved function and pain management as land provided little to no results. CRPS reportedly appears to be spreading now including her LUE and right foot.   She presents with pain limited deficits in ROM, endurance, activity tolerance, gait, balance, and functional mobility with ADL's. She is a good candidate for aquatic intervention and will benefit from the properties of water to progress towards functional goals.    OBJECTIVE IMPAIRMENTS: Abnormal gait, decreased activity tolerance, decreased balance, decreased endurance, decreased knowledge of condition, decreased mobility, difficulty walking, decreased ROM, decreased strength, obesity, and pain.   ACTIVITY LIMITATIONS: carrying, lifting, sitting, standing, squatting, stairs, transfers, and bed mobility  PARTICIPATION LIMITATIONS: meal prep, cleaning, laundry, driving, shopping, community activity, and occupation  REHAB POTENTIAL: Good  CLINICAL DECISION MAKING: Evolving/moderate complexity  EVALUATION COMPLEXITY: Moderate   GOALS: Goals reviewed with patient? Yes  SHORT TERM GOALS: Target date: 10/17 Pt will tolerate full aquatic sessions consistently without increase in pain and with improving function to demonstrate good toleration and effectiveness of intervention.  Baseline: Goal status: INITIAL  2.  Pt will enter/exit  pool using steps without excessive pain or difficulty Baseline:  Goal status: IN PROGRESS - 05/31/24  3.  Pt  will report a 25% reduction of pain while submerged as evidence of using properties of water for pain management Baseline:  Goal status: INITIAL   LONG TERM GOALS: Target date: 11/14  Pt to improve on LEFS by at least 9 point to demonstrate statistically significant Improvement in function. Baseline:LEFS 9/80 Goal status: INITIAL  2.  Pt will report decrease in pain by at least 25% for improved toleration to activity/quality of life and to demonstrate improved management of pain. Baseline:  Goal status: INITIAL  3.  Pt will improve strength in hips by 1 grade to demonstrate improved overall physical function. Baseline:  Goal status: INITIAL  4.  Pt will tolerate walking either to or from setting (455ft) and tolerate full aquatic sessions without increase in pain or significant fatigue to demonstrate improved amb toleration Baseline:  Goal status: INITIAL     PLAN:  PT FREQUENCY: 1-2x/week  PT DURATION: 8 weeks likely 12 sessions to determine benefit  PLANNED INTERVENTIONS: 97164- PT Re-evaluation, 97750- Physical Performance Testing, 97110-Therapeutic exercises, 97530- Therapeutic activity, 97112- Neuromuscular re-education, 97535- Self Care, 02859- Manual therapy, [id number]- Gait training, [phone]- Aquatic Therapy, [phone] (1-2 muscles), [id number] (3+ muscles)- Dry Needling, Patient/Family education, Balance training, Stair training, Taping, Joint mobilization, DME instructions, Cryotherapy, and Moist heat  PLAN FOR NEXT SESSION: aquatics only: pain management; gentle movement; strengthening and stretching as tolerated; balance retraining  Delon Aquas, PTA 06/06/24 5:54 PM [facility] [address] [place], [place], [address] Phone: [phone]   Fax:  [phone]

## 2024-06-08 ENCOUNTER — Ambulatory Visit

## 2024-06-08 ENCOUNTER — Ambulatory Visit (HOSPITAL_BASED_OUTPATIENT_CLINIC_OR_DEPARTMENT_OTHER): Admitting: Physical Therapy

## 2024-06-08 DIAGNOSIS — F331 Major depressive disorder, recurrent, moderate: Secondary | ICD-10-CM | POA: Diagnosis not present

## 2024-06-08 DIAGNOSIS — R2689 Other abnormalities of gait and mobility: Secondary | ICD-10-CM

## 2024-06-08 DIAGNOSIS — M25572 Pain in left ankle and joints of left foot: Secondary | ICD-10-CM

## 2024-06-08 DIAGNOSIS — F419 Anxiety disorder, unspecified: Secondary | ICD-10-CM | POA: Diagnosis not present

## 2024-06-08 DIAGNOSIS — G894 Chronic pain syndrome: Secondary | ICD-10-CM | POA: Diagnosis not present

## 2024-06-08 DIAGNOSIS — R202 Paresthesia of skin: Secondary | ICD-10-CM

## 2024-06-08 DIAGNOSIS — M79662 Pain in left lower leg: Secondary | ICD-10-CM

## 2024-06-08 NOTE — Therapy (Signed)
 OUTPATIENT PHYSICAL THERAPY LOWER EXTREMITY TREATMENT   Patient Name: Caitlin Bryant MRN: [id number] DOB:[birth year], 43 y.o., female Today's Date: 06/08/2024  END OF SESSION:  PT End of Session - 06/08/24 1749     Visit Number 4    Date for Recertification  07/07/24    PT Start Time 1702    PT Stop Time 1735    PT Time Calculation (min) 33 min    Behavior During Therapy [facility] for tasks assessed/performed           Past Medical History:  Diagnosis Date   Anxiety    Headache    MRSA (methicillin resistant staph aureus) culture positive    5 or more years ago, right thumb   Past Surgical History:  Procedure Laterality Date   ANKLE ARTHROSCOPY WITH RECONSTRUCTION Left 08/19/2021   Procedure: LEFT ANKLE ARTHROSCOPY, ARTHROSCOPIC TREATMENT OF TALUS OSTEOCHONDRAL LESION, LATERAL LIGAMENT RECONSTRUCTION, PERONEAL TENDON DEBRIDEMENT, REPAIR OF DISLOCATING PERONEAL TENDONS;  Surgeon: Elsa Lonni SAUNDERS, MD;  Location: [facility];  Service: Orthopedics;  Laterality: Left;  LENGTH OF SURGERY: 120 MINUTES   CESAREAN SECTION  2004 and 2008   x 2   DIAGNOSTIC LAPAROSCOPY     TONSILLECTOMY     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 05/24/2022   Complex regional pain syndrome i of left lower limb 05/24/2022   Obesity (BMI 35.0-39.9 without comorbidity) 06/25/2021   Disorder of lumbosacral intervertebral disc 06/15/2021   Chronic pain of left ankle 06/08/2021   Routine general medical examination at a health care facility 03/17/2021   Other fatigue 02/23/2021   Acute bilateral low back pain without sciatica 06/09/2019   Left knee pain  04/20/2018   Impacted cerumen of right ear 04/15/2017   Neck swelling 08/07/2016   Depression 07/24/2015   TMJ dysfunction 07/03/2015   Migraine 05/10/2015   Ankle instability, left 02/22/2015   Generalized anxiety disorder 01/31/2015   Allergic rhinitis 01/31/2015    PCP: Toribio Slain MD  REFERRING PROVIDER: Murray Loupe MD   REFERRING DIAG:  G90.522 (ICD-10-CM) - Complex regional pain syndrome type 1 of left lower extremity  G89.4 (ICD-10-CM) - Chronic pain syndrome    THERAPY DIAG:  Pain in left ankle and joints of left foot  Pain in left lower leg  Paresthesia of skin  Other abnormalities of gait and mobility  Rationale for Evaluation and Treatment: Rehabilitation  ONSET DATE: 2021  SUBJECTIVE:   SUBJECTIVE STATEMENT: Pt reports she thinks the chlorine may increase her symptoms.  She was able to shower shortly after session.  Not as much increase in swelling as she had after first session.    Initial statement: Have had lots of therapy but the land just hurts and has gotten progressively worse. Did 1 session of water therapy last year and wanted to try more. Was at neuro. CRPS is spreading. Husband present with her and has built a pool .  She is unable to climb up the ladder. CRPS started out in toes after surgery 2021 from left ankle reconstruction.   PERTINENT HISTORY: Leg pain, CRPS, consider aquatic therapy   CPRS LLE pain after L ankle tendon surgery 2022-she  also appears to be developing symptoms over her left upper extremity and likely right lower extremity   Are you having pain? Yes: NPRS scale: current 5-6/10;  Pain location: generalized - Lt toes and ankle up LLE and LUE/hand Pain description: burning, sharp; stabbing constant Aggravating factors: everything Relieving factors: LE elevation  PRECAUTIONS: None  RED FLAGS: None   WEIGHT BEARING RESTRICTIONS: No  FALLS:  Has patient fallen in last 6 months? No  OCCUPATION:  disability  PLOF: Needs assistance with ADLs, Needs assistance with homemaking, Needs assistance with gait, and Needs assistance with transfers  PATIENT GOALS: decreased pain and  more mobility  NEXT MD VISIT: as needed  OBJECTIVE:  Note: Objective measures were completed at Evaluation unless otherwise noted.   PATIENT SURVEYS:  LEFS 9/80  COGNITION: Overall cognitive status: Within functional limits for tasks assessed     SENSATION: CRPS  EDEMA:  Mild edema about left foot and ankle; left wrist    POSTURE: No Significant postural limitations  PALPATION: Superficial Touch increases nerve pain through LLE and lue through elbow  LUMBAR ROM wfl given extra time for movement into end range due to pain  LOWER EXTREMITY ROM:  Active ROM Right eval Left eval  Hip flexion full full  Hip extension    Hip abduction    Hip adduction    Hip internal rotation    Hip external rotation    Knee flexion    Knee extension    Ankle dorsiflexion    Ankle plantarflexion    Ankle inversion    Ankle eversion     (Blank rows = not tested)  LOWER EXTREMITY MMT:   P!=pain MMT Right eval Left eval  Hip flexion 4P! 3+P!  Hip extension    Hip abduction    Hip adduction    Hip internal rotation    Hip external rotation    Knee flexion 5 4  Knee extension 5 5  Ankle dorsiflexion 4+ 4  Ankle plantarflexion    Ankle inversion    Ankle eversion     (Blank rows = not tested)   FUNCTIONAL TESTS:  Timed up and go (TUG): 20  4 stage balance: Passed 1; assistance to gain semi tandem hold x7 s; Tandem and SLS not able to complete GAIT: Distance walked: 400 ft from lobby  Assistive device utilized: none Level of assistance: HHA husband Comments: walking on toes in slight knee flex                                                                                                                                TREATMENT  [facility] Adult PT Treatment:                                              Date:06/06/24  Pt seen  for aquatic therapy today.  Treatment took place in water 3.5-4.75 ft in depth at the [facility] pool. Temp of water was 91.  Pt entered/exited the pool via stairs independently, slowly in step-to pattern with bil rail.  - hands resting on white noodle  entire session(to support LUE out of water): - walking forward/ backward, Feels better facing hot tub; side stepping R/L partial widths - supported prone float for short rest -Rt hand on wall:  gentle toe/partial heel raises x5; hip add/abd, alternating 5 each; alternating hamstring curls x 5 each; hip extension x 5 - return to walking with UE on white noodle  - UE on white noodle: hip circles x 5  - return to supported prone float for short rest -> prone hip abdct/ add  - straddling noodle, hands on white cycling, hip abdct/add, cycling (relaxed pace)   Pt requires the buoyancy and hydrostatic pressure of water for support, and to offload joints by unweighting joint load by at least 50 % in navel deep water and by at least 75-80% in chest to neck deep water.  Viscosity of the water is needed for resistance of strengthening. Water current perturbations provides challenge to standing balance requiring increased core activation.     PATIENT EDUCATION:  Education details: intro to aquatic therapy   Person educated: Patient and Spouse Education method: Explanation Education comprehension: verbalized understanding  HOME EXERCISE PROGRAM: From prior land based session [id number]   ASSESSMENT:  CLINICAL IMPRESSION: Repeated similar exercises from last session, as pt reported improved tolerance last session.  She was given intermittent rest breaks where she floated in prone position, UE on white noodle.   She reported overall reduction of pain to 4/10 during session, when submerged 80%.  Session shortened per pt request.  Goals are ongoing. She used WC for husband to transport her after session, to avoid  increase in symptoms during walk from pool to car.    Initial eval:  Patient is a 43 y.o. f who was seen today for physical therapy evaluation and treatment for CRPS left extremities. She is well known to [facility] rehab having been seen for episode ~ 1 year ago on land with 1 aquatic session for similar dx. She returns today with interest in a trial of aquatic intervention for improved function and pain management as land provided little to no results. CRPS reportedly appears to be spreading now including her LUE and right foot.   She presents with pain limited deficits in ROM, endurance, activity tolerance, gait, balance, and functional mobility with ADL's. She is a good candidate for aquatic intervention and will benefit from the properties of water to progress towards functional goals.    OBJECTIVE IMPAIRMENTS: Abnormal gait, decreased activity tolerance, decreased balance, decreased endurance, decreased knowledge of condition, decreased mobility, difficulty walking, decreased ROM, decreased strength, obesity, and pain.   ACTIVITY LIMITATIONS: carrying, lifting, sitting, standing, squatting, stairs, transfers, and bed mobility  PARTICIPATION LIMITATIONS: meal prep, cleaning, laundry, driving, shopping, community activity, and occupation  REHAB POTENTIAL: Good  CLINICAL DECISION MAKING: Evolving/moderate complexity  EVALUATION COMPLEXITY: Moderate   GOALS: Goals reviewed with patient? Yes  SHORT TERM GOALS: Target date: 10/17 Pt will tolerate full aquatic sessions consistently without increase in pain and with improving function to demonstrate good toleration and effectiveness of intervention.  Baseline: Goal status: INITIAL  2.  Pt will enter/exit pool using steps without excessive pain or difficulty Baseline:  Goal status: IN PROGRESS - 05/31/24  3.  Pt will  report a 25% reduction of pain while submerged as evidence of using properties of water for pain management Baseline:  Goal  status: INITIAL   LONG TERM GOALS: Target date: 11/14  Pt to improve on LEFS by at least 9 point to demonstrate statistically significant Improvement in function. Baseline:LEFS 9/80 Goal status: INITIAL  2.  Pt will report decrease in pain by at least 25% for improved toleration to activity/quality of life and to demonstrate improved management of pain. Baseline:  Goal status: INITIAL  3.  Pt will improve strength in hips by 1 grade to demonstrate improved overall physical function. Baseline:  Goal status: INITIAL  4.  Pt will tolerate walking either to or from setting (419ft) and tolerate full aquatic sessions without increase in pain or significant fatigue to demonstrate improved amb toleration Baseline:  Goal status: INITIAL     PLAN:  PT FREQUENCY: 1-2x/week  PT DURATION: 8 weeks likely 12 sessions to determine benefit  PLANNED INTERVENTIONS: 97164- PT Re-evaluation, 97750- Physical Performance Testing, 97110-Therapeutic exercises, 97530- Therapeutic activity, 97112- Neuromuscular re-education, 97535- Self Care, 02859- Manual therapy, [id number]- Gait training, [phone]- Aquatic Therapy, [phone] (1-2 muscles), 20561 (3+ muscles)- Dry Needling, Patient/Family education, Balance training, Stair training, Taping, Joint mobilization, DME instructions, Cryotherapy, and Moist heat  PLAN FOR NEXT SESSION: aquatics only: pain management; gentle movement; strengthening and stretching as tolerated; balance retraining  Delon Aquas, PTA 06/08/24 5:50 PM [facility] [address] [place], [place], [address] Phone: [phone]   Fax:  [phone]

## 2024-06-13 ENCOUNTER — Ambulatory Visit (HOSPITAL_BASED_OUTPATIENT_CLINIC_OR_DEPARTMENT_OTHER): Admitting: Physical Therapy

## 2024-06-13 ENCOUNTER — Encounter (HOSPITAL_BASED_OUTPATIENT_CLINIC_OR_DEPARTMENT_OTHER): Admitting: Psychology

## 2024-06-13 ENCOUNTER — Encounter (HOSPITAL_BASED_OUTPATIENT_CLINIC_OR_DEPARTMENT_OTHER): Payer: Self-pay | Admitting: Physical Therapy

## 2024-06-13 ENCOUNTER — Other Ambulatory Visit: Payer: Self-pay | Admitting: Family Medicine

## 2024-06-13 DIAGNOSIS — R2689 Other abnormalities of gait and mobility: Secondary | ICD-10-CM

## 2024-06-13 DIAGNOSIS — R202 Paresthesia of skin: Secondary | ICD-10-CM

## 2024-06-13 DIAGNOSIS — G43909 Migraine, unspecified, not intractable, without status migrainosus: Secondary | ICD-10-CM

## 2024-06-13 DIAGNOSIS — G894 Chronic pain syndrome: Secondary | ICD-10-CM

## 2024-06-13 DIAGNOSIS — F331 Major depressive disorder, recurrent, moderate: Secondary | ICD-10-CM

## 2024-06-13 DIAGNOSIS — F419 Anxiety disorder, unspecified: Secondary | ICD-10-CM | POA: Diagnosis not present

## 2024-06-13 DIAGNOSIS — M79662 Pain in left lower leg: Secondary | ICD-10-CM

## 2024-06-13 DIAGNOSIS — M25572 Pain in left ankle and joints of left foot: Secondary | ICD-10-CM

## 2024-06-13 NOTE — Therapy (Signed)
 OUTPATIENT PHYSICAL THERAPY LOWER EXTREMITY TREATMENT   Patient Name: Caitlin Bryant MRN: [id number] DOB:Jul 23, 1981, 43 y.o., female Today's Date: 06/13/2024  END OF SESSION:  PT End of Session - 06/13/24 1715     Visit Number 5    Date for Recertification  07/07/24    PT Start Time 1707    PT Stop Time 1737    PT Time Calculation (min) 30 min    Activity Tolerance Patient limited by pain    Behavior During Therapy [facility] for tasks assessed/performed           Past Medical History:  Diagnosis Date   Anxiety    Headache    MRSA (methicillin resistant staph aureus) culture positive    5 or more years ago, right thumb   Past Surgical History:  Procedure Laterality Date   ANKLE ARTHROSCOPY WITH RECONSTRUCTION Left 08/19/2021   Procedure: LEFT ANKLE ARTHROSCOPY, ARTHROSCOPIC TREATMENT OF TALUS OSTEOCHONDRAL LESION, LATERAL LIGAMENT RECONSTRUCTION, PERONEAL TENDON DEBRIDEMENT, REPAIR OF DISLOCATING PERONEAL TENDONS;  Surgeon: Elsa Lonni SAUNDERS, MD;  Location:  SURGERY CENTER;  Service: Orthopedics;  Laterality: Left;  LENGTH OF SURGERY: 120 MINUTES   CESAREAN SECTION  2004 and 2008   x 2   DIAGNOSTIC LAPAROSCOPY     TONSILLECTOMY     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 05/24/2022   Complex regional pain syndrome i of left lower limb 05/24/2022   Obesity (BMI 35.0-39.9 without comorbidity) 06/25/2021   Disorder of lumbosacral intervertebral disc 06/15/2021   Chronic pain of left ankle 06/08/2021   Routine general medical examination at a health care facility 03/17/2021   Other fatigue 02/23/2021   Acute bilateral low back pain  without sciatica 06/09/2019   Left knee pain 04/20/2018   Impacted cerumen of right ear 04/15/2017   Neck swelling 08/07/2016   Depression 07/24/2015   TMJ dysfunction 07/03/2015   Migraine 05/10/2015   Ankle instability, left 02/22/2015   Generalized anxiety disorder 01/31/2015   Allergic rhinitis 01/31/2015    PCP: Toribio Slain MD  REFERRING PROVIDER: Murray Loupe MD   REFERRING DIAG:  G90.522 (ICD-10-CM) - Complex regional pain syndrome type 1 of left lower extremity  G89.4 (ICD-10-CM) - Chronic pain syndrome    THERAPY DIAG:  Pain in left ankle and joints of left foot  Pain in left lower leg  Paresthesia of skin  Other abnormalities of gait and mobility  Rationale for Evaluation and Treatment: Rehabilitation  ONSET DATE: 2021  SUBJECTIVE:   SUBJECTIVE STATEMENT: Pt reports she did have some pain and swelling after last session, but not as much as after 1st session. Pt reports pain is higher today.  I feel like I'm getting something out of this because I'm able to more each session.    Initial statement: Have had lots of therapy but the land just hurts and has gotten progressively worse. Did 1 session of water therapy last year and wanted to try more. Was at neuro. CRPS is spreading. Husband present with her and has built a pool .  She is unable to climb up the ladder. CRPS started out in toes after surgery 2021 from left ankle reconstruction.  PERTINENT HISTORY: Leg pain, CRPS, consider aquatic therapy   CPRS LLE pain after L ankle tendon surgery 2022-she also appears to be developing symptoms over her left upper extremity and likely right lower extremity   Are you having pain? Yes: NPRS scale: current 7/10;  Pain location: generalized - Lt toes and ankle up LLE and LUE/hand Pain description: burning, sharp; stabbing constant Aggravating factors: everything Relieving factors: LE elevation  PRECAUTIONS: None  RED FLAGS: None   WEIGHT BEARING  RESTRICTIONS: No  FALLS:  Has patient fallen in last 6 months? No  OCCUPATION: disability  PLOF: Needs assistance with ADLs, Needs assistance with homemaking, Needs assistance with gait, and Needs assistance with transfers  PATIENT GOALS: decreased pain and  more mobility  NEXT MD VISIT: as needed  OBJECTIVE:  Note: Objective measures were completed at Evaluation unless otherwise noted.   PATIENT SURVEYS:  LEFS 9/80  COGNITION: Overall cognitive status: Within functional limits for tasks assessed     SENSATION: CRPS  EDEMA:  Mild edema about left foot and ankle; left wrist    POSTURE: No Significant postural limitations  PALPATION: Superficial Touch increases nerve pain through LLE and lue through elbow  LUMBAR ROM wfl given extra time for movement into end range due to pain  LOWER EXTREMITY ROM:  Active ROM Right eval Left eval  Hip flexion full full  Hip extension    Hip abduction    Hip adduction    Hip internal rotation    Hip external rotation    Knee flexion    Knee extension    Ankle dorsiflexion    Ankle plantarflexion    Ankle inversion    Ankle eversion     (Blank rows = not tested)  LOWER EXTREMITY MMT:   P!=pain MMT Right eval Left eval  Hip flexion 4P! 3+P!  Hip extension    Hip abduction    Hip adduction    Hip internal rotation    Hip external rotation    Knee flexion 5 4  Knee extension 5 5  Ankle dorsiflexion 4+ 4  Ankle plantarflexion    Ankle inversion    Ankle eversion     (Blank rows = not tested)   FUNCTIONAL TESTS:  Timed up and go (TUG): 20  4 stage balance: Passed 1; assistance to gain semi tandem hold x7 s; Tandem and SLS not able to complete GAIT: Distance walked: 400 ft from lobby  Assistive device utilized: none Level of assistance: HHA husband Comments: walking on toes in slight knee flex                                                                                                                                 TREATMENT  [facility] Adult PT Treatment:  Date:06/13/24  Pt seen for aquatic therapy today.  Treatment took place in water 3.5-4.75 ft in depth at the [facility] pool. Temp of water was 91.  Pt entered/exited the pool via stairs independently, slowly in step-to pattern with bil rail.  - hands resting on white noodle  entire session(to support LUE out of water): - walking forward/ backward 3 laps, Feels better facing hot tub; side stepping R/L 3 laps - supported prone float for short rest -Rt hand on wall:  gentle toe/partial heel raises x5; hip add/abd, alternating 5 each; alternating hamstring curls x 5 each; hip extension x 5 - straddling yellow noodle, hands on white cycling, hip abdct/add, cycling (relaxed pace) - return to walking forward/ backward with UE on noodle - UE on white noodle: hip circles x 5 , alternating LEs - row motion with UE On white noodle x 8    Pt requires the buoyancy and hydrostatic pressure of water for support, and to offload joints by unweighting joint load by at least 50 % in navel deep water and by at least 75-80% in chest to neck deep water.  Viscosity of the water is needed for resistance of strengthening. Water current perturbations provides challenge to standing balance requiring increased core activation.     PATIENT EDUCATION:  Education details: intro to aquatic therapy   Person educated: Patient and Spouse Education method: Explanation Education comprehension: verbalized understanding  HOME EXERCISE PROGRAM: From prior land based session [id number]   ASSESSMENT:  CLINICAL IMPRESSION: Repeated similar exercises from last session, as pt reported improved tolerance last session.She was able to ambulate continuously for 6 laps (forward/backward/ side stepping) today, with is improvement from previous session.  She only needed 2 short rest breaks where she floated in prone position, UE on  white noodle.   She reported overall reduction of pain to 5/10 during session, when submerged 80%.  Session shortened per pt request. Pt has met STG2. She used WC for husband to transport her after session, to avoid increase in symptoms during walk from pool to car.    Initial eval:  Patient is a 43 y.o. f who was seen today for physical therapy evaluation and treatment for CRPS left extremities. She is well known to [facility] rehab having been seen for episode ~ 1 year ago on land with 1 aquatic session for similar dx. She returns today with interest in a trial of aquatic intervention for improved function and pain management as land provided little to no results. CRPS reportedly appears to be spreading now including her LUE and right foot.   She presents with pain limited deficits in ROM, endurance, activity tolerance, gait, balance, and functional mobility with ADL's. She is a good candidate for aquatic intervention and will benefit from the properties of water to progress towards functional goals.    OBJECTIVE IMPAIRMENTS: Abnormal gait, decreased activity tolerance, decreased balance, decreased endurance, decreased knowledge of condition, decreased mobility, difficulty walking, decreased ROM, decreased strength, obesity, and pain.   ACTIVITY LIMITATIONS: carrying, lifting, sitting, standing, squatting, stairs, transfers, and bed mobility  PARTICIPATION LIMITATIONS: meal prep, cleaning, laundry, driving, shopping, community activity, and occupation  REHAB POTENTIAL: Good  CLINICAL DECISION MAKING: Evolving/moderate complexity  EVALUATION COMPLEXITY: Moderate   GOALS: Goals reviewed with patient? Yes  SHORT TERM GOALS: Target date: 10/17 Pt will tolerate full aquatic sessions consistently without increase in pain and with improving function to demonstrate good toleration and effectiveness of intervention.  Baseline: Goal status: INITIAL  2.  Pt will enter/exit pool using steps without  excessive pain or difficulty Baseline:  Goal status:MET- 06/13/24  3.  Pt will report a 25% reduction of pain while submerged as evidence of using properties of water for pain management Baseline: varies visit to visit Goal status:IN PROGRESS -06/13/24   LONG TERM GOALS: Target date: 11/14  Pt to improve on LEFS by at least 9 point to demonstrate statistically significant Improvement in function. Baseline:LEFS 9/80 Goal status: INITIAL  2.  Pt will report decrease in pain by at least 25% for improved toleration to activity/quality of life and to demonstrate improved management of pain. Baseline:  Goal status: INITIAL  3.  Pt will improve strength in hips by 1 grade to demonstrate improved overall physical function. Baseline:  Goal status: INITIAL  4.  Pt will tolerate walking either to or from setting (417ft) and tolerate full aquatic sessions without increase in pain or significant fatigue to demonstrate improved amb toleration Baseline:  Goal status: INITIAL     PLAN:  PT FREQUENCY: 1-2x/week  PT DURATION: 8 weeks likely 12 sessions to determine benefit  PLANNED INTERVENTIONS: 97164- PT Re-evaluation, 97750- Physical Performance Testing, 97110-Therapeutic exercises, 97530- Therapeutic activity, 97112- Neuromuscular re-education, 97535- Self Care, 02859- Manual therapy, [id number]- Gait training, [phone]- Aquatic Therapy, [phone] (1-2 muscles), 20561 (3+ muscles)- Dry Needling, Patient/Family education, Balance training, Stair training, Taping, Joint mobilization, DME instructions, Cryotherapy, and Moist heat  PLAN FOR NEXT SESSION: aquatics only: pain management; gentle movement; strengthening and stretching as tolerated; balance retraining  Delon Aquas, PTA 06/13/24 5:37 PM [facility] [address] [place], [place], [address] Phone: [phone]   Fax:  [phone]

## 2024-06-19 NOTE — Progress Notes (Signed)
 Neuropsychology / Psychotherapy Note  Jolynn DEL. [facility]  Physical Medicine and Rehabilitation    Patient: Caitlin Bryant  DOB: 12-12-1980  MRN: [id number]  Location:  [facility]  [address] [place] [address] Dept: [phone]  Provider: Evalene DOROTHA Riff, PsyD Modality & Individuals Present: The patient was seen in-person, unaccompanied, by the provider in the PM&R outpatient clinic office.   Date of Service: 06/13/24 Start: 1 PM End: 2 PM Duration of Service:  60 min   Reason For Service/Background 03/22/24:The patient was referred for evaluation and treatment by her physiatrist, Dr. Murray, for concerns related to chronic pain and psychiatric symptoms. 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 Symptoms are reported to be developing over left upper extremity and well. Additional details regarding medical are noted below. Upon interview, the patient indicated that goals for treatment for her included having a little more confidence in myself, and get out there and do things.  Depression: Endorsed frequent low mood and declines in energy and concentration. She endorsed excessive guilt/negative self evaluation, reduced enjoyment, and significant changes in weight. This has been present at current levels since late last year, but also present to varying degrees the prior years. She denied any suicidal ideation or psychomotor slowing.   Anxiety: Currently experiences active and significant difficulties with anxiety. Endorsed experiences of excessive/difficult to control worry, muscle tension, fatigue, difficulty concentrating, irritability, and (when leaving the home) feeling on-edge.  Panic: Experiences panic attacks approximately twice per month. Onset in adulthood. Denied  indications of panic disorder.  Trauma Hx/PTSD Sx: Endorsed history of trauma but did not feel comfortable discussing this during the current visit (possibly because of the presence of family), but said she would be willing to discuss it at a future visit.  Mania/Hypomania: Denied any indications of mania/hypomania past or present. Hallucination: None, past or present.  Paranoia: None, past or present.  Thought disorder/Psychosis: None, past or present.  OCD: None, past or present.  Substance Use: None, past or present. Very rare alcohol consumption.  Suicidal Ideation: None, past or present.  Homicidal Ideation: None, past or present.  Risk Factors/Safety Concerns: None. Future oriented. No previous history of current ideation. No SU, Not socially isolated, no recent deaths of loved ones, family is protective factor.  Sleep: Reported trying to obtain about 8 hours of sleep per night. Reported not feeling rested in the morning. Sometimes takes up to an hour to return to sleep at times. Denied significant problems with onset. Denied frequent nightmares. Sleep difficulties have been present for multiple years but lack of feeling rested in the morning has been over the last few months.  Appetite: Increase in appetite. Reported weight gain since time of injury. n Caffeine : Rare Psychiatric Treatment History: Has trialed antidepressant medications; was previously on zoloft  (2021) and has been on duloxetine  for about 8-12 months. She has never engaged in individual therapy.  Psychosocial Stressors: Identified health problems as her primary stressor. Eluded to the presence of significant familial stressors around 2021 but pt did not wish to discuss them at this time.   Tx Plan / Modality Patient Stated Goal: improve overall confidence levels and activity levels.  Reduction of psychiatric symptoms (anxiety/panic, depression) Reduction of chronic pain related interference on activity levels.   Intervention: Brief CBT for Chronic pain;  psychoeducation, behavioral, CBT, and relaxation/mindfulness based interventions. CBT (General) for depression and anxiety. CBT and mindfulness/relaxation for anxiety.  Modality: Individual therapy. Weekly (possibly every-other week due to patient's availability) Visits will be in-person, 60 minute duration  Session Content:  Themes/Topics Discussed/Subjective Reports:  Mood on average since last session: 5 (neutral/ meh) Anxiety on average since last session: 6/10 to 7/10 in severity. -Discussed family event outside home pt was previously anxious about. Reinforced activity along with balance of personal needs.  -Explored cognitive distortions related to self-perception.  -Pt indicated overall positive feelings around going out, despite any negative outcomes with physical symptoms and anticipitory anx.  -Explored and reinforced beneficial coping strategies currently utilized by patient.  -Explored patient beliefs about utility of worry. Provided psychoeducaiton.  -Utilized techniques to facilitate cognitive flexibility   Observations: Alert, cooperative, attentive and engaged. Mood improved relative to prior session, anxiety slightly reduced.   Therapeutic Interventions/Techniques Used: CBT, ACT related techniques, psychoeducation, insight. Coping skills.     Response to Interventions & Barriers to Progress or Engagement: Demonstrated understanding of the material covered. Excessive worry and perceptions of utility remain, but are declining. Activity levels are promising as are willingness to engage in activities despite fears related to physical pain.   Safety Concerns/Risk: No SI/HI, no Hx of attempts/ideation.    Goals focused on this session: Behavioral activation. Anxiety. Chronic pain. Coping skills.   Plan/Goals for Next Session:  -Continue work on reducing reliance on worry for coping.  -Revisit interpersonal communication strategies.   -Continue to support behavioral activation with attention to pace/self-care.   Additional topics to address: Worry/anxious rumination, safety behaviors, self-efficacy, identification of rapid automatic negative thoughts, identification of source/adapt to present vs. Hx learned tendencies, validation/support/normalization.    Diagnosis:  Major depressive disorder, recurrent episode, moderate (HCC) [F33.1]  Anxiety disorder, unspecified type [F41.9] (r/o PTSD, agoraphobia) Complex pain syndrome  Evalene DOROTHA Riff, PsyD Clinical Psychologist / Neuropsychologist  This report was generated using voice recognition software. While this document has been carefully reviewed, transcription errors may be present. I apologize in advance for any inconvenience. Please contact me if further clarification is needed.

## 2024-06-19 NOTE — Progress Notes (Signed)
 Neuropsychology / Psychotherapy Note  Jolynn DEL. [facility]  Physical Medicine and Rehabilitation    Patient: Caitlin Bryant  DOB: [birth year]  MRN: [id number]  Location:  [facility]  [address] [place] [address] Dept: [phone]  Provider: Evalene DOROTHA Riff, PsyD Modality & Individuals Present: The patient was seen in-person, unaccompanied, by the provider in the PM&R outpatient clinic office.   Date of Service: 06/02/24 Start: 2 PM End: 3 PM Duration of Service:  60 min   Reason For Service/Background 03/22/24:The patient was referred for evaluation and treatment by her physiatrist, Dr. Murray, for concerns related to chronic pain and psychiatric symptoms. 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 Symptoms are reported to be developing over left upper extremity and well. Additional details regarding medical are noted below. Upon interview, the patient indicated that goals for treatment for her included having a little more confidence in myself, and get out there and do things.  Depression: Endorsed frequent low mood and declines in energy and concentration. She endorsed excessive guilt/negative self evaluation, reduced enjoyment, and significant changes in weight. This has been present at current levels since late last year, but also present to varying degrees the prior years. She denied any suicidal ideation or psychomotor slowing.   Anxiety: Currently experiences active and significant difficulties with anxiety. Endorsed experiences of excessive/difficult to control worry, muscle tension, fatigue, difficulty concentrating, irritability, and (when leaving the home) feeling on-edge.  Panic: Experiences panic attacks approximately twice per month. Onset in adulthood. Denied  indications of panic disorder.  Trauma Hx/PTSD Sx: Endorsed history of trauma but did not feel comfortable discussing this during the current visit (possibly because of the presence of family), but said she would be willing to discuss it at a future visit.  Mania/Hypomania: Denied any indications of mania/hypomania past or present. Hallucination: None, past or present.  Paranoia: None, past or present.  Thought disorder/Psychosis: None, past or present.  OCD: None, past or present.  Substance Use: None, past or present. Very rare alcohol consumption.  Suicidal Ideation: None, past or present.  Homicidal Ideation: None, past or present.  Risk Factors/Safety Concerns: None. Future oriented. No previous history of current ideation. No SU, Not socially isolated, no recent deaths of loved ones, family is protective factor.  Sleep: Reported trying to obtain about 8 hours of sleep per night. Reported not feeling rested in the morning. Sometimes takes up to an hour to return to sleep at times. Denied significant problems with onset. Denied frequent nightmares. Sleep difficulties have been present for multiple years but lack of feeling rested in the morning has been over the last few months.  Appetite: Increase in appetite. Reported weight gain since time of injury. n Caffeine : Rare Psychiatric Treatment History: Has trialed antidepressant medications; was previously on zoloft  (2021) and has been on duloxetine  for about 8-12 months. She has never engaged in individual therapy.  Psychosocial Stressors: Identified health problems as her primary stressor. Eluded to the presence of significant familial stressors around 2021 but pt did not wish to discuss them at this time.   Tx Plan / Modality Patient Stated Goal: improve overall confidence levels and activity levels.  Reduction of psychiatric symptoms (anxiety/panic, depression) Reduction of chronic pain related interference on activity levels.   Intervention: Brief CBT for Chronic pain;  psychoeducation, behavioral, CBT, and relaxation/mindfulness based interventions. CBT (General) for depression and anxiety. CBT and mindfulness/relaxation for anxiety.  Modality: Individual therapy. Weekly (possibly every-other week due to patient's availability) Visits will be in-person, 60 minute duration  Session Content:  Themes/Topics Discussed/Subjective Reports:  -Increased pain relative to prior. 8/10 severity. Motivated to proceed with water therapy.  -Explored pain interpretation, cognitive biases/tendencies to interpret as acute injury.  -Pt reduction of reassurance seeking per her report. Discussed goal of internalizing reassurance / trusting own judgement. Connecting to prior instances of it was okay  -Processed pt feelings around other's responses of support, feelings of conflict w/self validation/acceptance conflicting with how she feels she must present. Fear of letting others down.  -Pt described some OCD-like behaviors. Rechecking excessively. Etc. Provided psychoeducation and discussed possible interventions.   Observations: Alert, cooperative, attentive and engaged. Slight increase in anxiety. Pain slight increase. Affect slightly more dysphoric and anxious relative to prior.   Therapeutic Interventions/Techniques Used: CBT, mindfulness/relaxation, psychoeducation, insight.     Response to Interventions & Barriers to Progress or Engagement: Demonstrated understanding of the material covered. Positive progress in gaining awareness of anxiety perpetuating behaviors and seems amenable to goal of internalizing reassurance.   Safety Concerns/Risk: No SI/HI, no Hx of attempts/ideation.    Goals focused on this session: Chronic pain, anxiety, behavioral strategies and CBT with psychoeducation.   Plan/Goals for Next Session:  -Anxiety/rumination/safety behaviors.  -Interpersonal communication and healthy social support use.   -Psychoeducation.    Additional topics to address: Worry/anxious rumination, safety behaviors, self-efficacy, identification of rapid automatic negative thoughts, identification of source/adapt to present vs. Hx learned tendencies, validation/support/normalization.    Diagnosis:  Major depressive disorder, recurrent episode, moderate (HCC) [F33.1]  Anxiety disorder, unspecified type [F41.9] (r/o PTSD, agoraphobia) Complex pain syndrome  Evalene DOROTHA Riff, PsyD Clinical Psychologist / Neuropsychologist  This report was generated using voice recognition software. While this document has been carefully reviewed, transcription errors may be present. I apologize in advance for any inconvenience. Please contact me if further clarification is needed.

## 2024-06-20 ENCOUNTER — Ambulatory Visit (HOSPITAL_BASED_OUTPATIENT_CLINIC_OR_DEPARTMENT_OTHER): Admitting: Physical Therapy

## 2024-06-20 ENCOUNTER — Encounter (HOSPITAL_BASED_OUTPATIENT_CLINIC_OR_DEPARTMENT_OTHER): Payer: Self-pay | Admitting: Physical Therapy

## 2024-06-20 DIAGNOSIS — G894 Chronic pain syndrome: Secondary | ICD-10-CM | POA: Diagnosis not present

## 2024-06-20 DIAGNOSIS — F419 Anxiety disorder, unspecified: Secondary | ICD-10-CM | POA: Diagnosis not present

## 2024-06-20 DIAGNOSIS — R2689 Other abnormalities of gait and mobility: Secondary | ICD-10-CM

## 2024-06-20 DIAGNOSIS — M79662 Pain in left lower leg: Secondary | ICD-10-CM

## 2024-06-20 DIAGNOSIS — F331 Major depressive disorder, recurrent, moderate: Secondary | ICD-10-CM | POA: Diagnosis not present

## 2024-06-20 DIAGNOSIS — M25572 Pain in left ankle and joints of left foot: Secondary | ICD-10-CM

## 2024-06-20 DIAGNOSIS — R202 Paresthesia of skin: Secondary | ICD-10-CM

## 2024-06-20 NOTE — Therapy (Signed)
 OUTPATIENT PHYSICAL THERAPY LOWER EXTREMITY TREATMENT   Patient Name: Caitlin Bryant MRN: [id number] DOB:[birth year], 43 y.o., female Today's Date: 06/20/2024  END OF SESSION:  PT End of Session - 06/20/24 1709     Visit Number 6    Date for Recertification  07/07/24    PT Start Time 1659    PT Stop Time 1737    PT Time Calculation (min) 38 min    Behavior During Therapy [facility] for tasks assessed/performed           Past Medical History:  Diagnosis Date   Anxiety    Headache    MRSA (methicillin resistant staph aureus) culture positive    5 or more years ago, right thumb   Past Surgical History:  Procedure Laterality Date   ANKLE ARTHROSCOPY WITH RECONSTRUCTION Left 08/19/2021   Procedure: LEFT ANKLE ARTHROSCOPY, ARTHROSCOPIC TREATMENT OF TALUS OSTEOCHONDRAL LESION, LATERAL LIGAMENT RECONSTRUCTION, PERONEAL TENDON DEBRIDEMENT, REPAIR OF DISLOCATING PERONEAL TENDONS;  Surgeon: Elsa Lonni SAUNDERS, MD;  Location: [facility];  Service: Orthopedics;  Laterality: Left;  LENGTH OF SURGERY: 120 MINUTES   CESAREAN SECTION  2004 and 2008   x 2   DIAGNOSTIC LAPAROSCOPY     TONSILLECTOMY     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 05/24/2022   Complex regional pain syndrome i of left lower limb 05/24/2022   Obesity (BMI 35.0-39.9 without comorbidity) 06/25/2021   Disorder of lumbosacral intervertebral disc 06/15/2021   Chronic pain of left ankle 06/08/2021   Routine general medical examination at a health care facility 03/17/2021   Other fatigue 02/23/2021   Acute bilateral low back pain without sciatica 06/09/2019   Left knee pain  04/20/2018   Impacted cerumen of right ear 04/15/2017   Neck swelling 08/07/2016   Depression 07/24/2015   TMJ dysfunction 07/03/2015   Migraine 05/10/2015   Ankle instability, left 02/22/2015   Generalized anxiety disorder 01/31/2015   Allergic rhinitis 01/31/2015    PCP: Toribio Slain MD  REFERRING PROVIDER: Murray Loupe MD   REFERRING DIAG:  G90.522 (ICD-10-CM) - Complex regional pain syndrome type 1 of left lower extremity  G89.4 (ICD-10-CM) - Chronic pain syndrome    THERAPY DIAG:  Pain in left ankle and joints of left foot  Pain in left lower leg  Paresthesia of skin  Other abnormalities of gait and mobility  Rationale for Evaluation and Treatment: Rehabilitation  ONSET DATE: 2021  SUBJECTIVE:   SUBJECTIVE STATEMENT: Pt reports she did well after last session, some increased pain but not too bad.  I want to try a longer session (longer than 30 min she had been doing per request)  Initial statement: Have had lots of therapy but the land just hurts and has gotten progressively worse. Did 1 session of water therapy last year and wanted to try more. Was at neuro. CRPS is spreading. Husband present with her and has built a pool .  She is unable to climb up the ladder. CRPS started out in toes after surgery 2021 from left ankle reconstruction.   PERTINENT HISTORY: Leg pain, CRPS, consider aquatic therapy   CPRS LLE pain after L ankle tendon surgery 2022-she also  appears to be developing symptoms over her left upper extremity and likely right lower extremity   Are you having pain? Yes: NPRS scale: current 4/10;  Pain location: generalized - Lt toes and ankle up LLE and LUE/hand Pain description: burning, sharp; stabbing constant Aggravating factors: everything Relieving factors: LE elevation  PRECAUTIONS: None  RED FLAGS: None   WEIGHT BEARING RESTRICTIONS: No  FALLS:  Has patient fallen in last 6 months? No  OCCUPATION: disability  PLOF: Needs  assistance with ADLs, Needs assistance with homemaking, Needs assistance with gait, and Needs assistance with transfers  PATIENT GOALS: decreased pain and  more mobility  NEXT MD VISIT: as needed  OBJECTIVE:  Note: Objective measures were completed at Evaluation unless otherwise noted.   PATIENT SURVEYS:  LEFS 9/80  COGNITION: Overall cognitive status: Within functional limits for tasks assessed     SENSATION: CRPS  EDEMA:  Mild edema about left foot and ankle; left wrist    POSTURE: No Significant postural limitations  PALPATION: Superficial Touch increases nerve pain through LLE and lue through elbow  LUMBAR ROM wfl given extra time for movement into end range due to pain  LOWER EXTREMITY ROM:  Active ROM Right eval Left eval  Hip flexion full full  Hip extension    Hip abduction    Hip adduction    Hip internal rotation    Hip external rotation    Knee flexion    Knee extension    Ankle dorsiflexion    Ankle plantarflexion    Ankle inversion    Ankle eversion     (Blank rows = not tested)  LOWER EXTREMITY MMT:   P!=pain MMT Right eval Left eval  Hip flexion 4P! 3+P!  Hip extension    Hip abduction    Hip adduction    Hip internal rotation    Hip external rotation    Knee flexion 5 4  Knee extension 5 5  Ankle dorsiflexion 4+ 4  Ankle plantarflexion    Ankle inversion    Ankle eversion     (Blank rows = not tested)   FUNCTIONAL TESTS:  Timed up and go (TUG): 20  4 stage balance: Passed 1; assistance to gain semi tandem hold x7 s; Tandem and SLS not able to complete GAIT: Distance walked: 400 ft from lobby  Assistive device utilized: none Level of assistance: HHA husband Comments: walking on toes in slight knee flex                                                                                                                                TREATMENT  [facility] Adult PT Treatment:                                             Date:06/20/24   Pt seen for  aquatic therapy today.  Treatment took place in water 3.5-4.75 ft in depth at the [facility] pool. Temp of water was 91.  Pt entered/exited the pool via stairs independently, slowly in step-to pattern with bil rail.  - hands resting on enki boards (to support LUE out of water): - walking forward/ backward 3 laps -  side stepping R/L 3 laps - standard stance with row motion with enki board - short rest -Rt hand on wall, Lt hand on enki board:  gentle partial heel raises x5; hip add/abd, alternating 5 each; alternating hamstring curls x 5 each;  -prone float with white noodle under arms for rest/recovery  - UE on white noodle: hip circles x 5 , alternating LEs; hip extension x 5 - marching backward/forward with UE on white noodle - hip hinge with forward arm reach with UE on white noodle x 5 - prone float with white noodle under arms for rest/recovery   - with hip abdct/ add 3 x 5  (gentle flutter and bicycle not tolerated in foot) - R/L hamstring stretch with heel on 3rd step (no stretch felt in LLE)  Pt requires the buoyancy and hydrostatic pressure of water for support, and to offload joints by unweighting joint load by at least 50 % in navel deep water and by at least 75-80% in chest to neck deep water.  Viscosity of the water is needed for resistance of strengthening. Water current perturbations provides challenge to standing balance requiring increased core activation.     PATIENT EDUCATION:  Education details: intro to aquatic therapy   Person educated: Patient and Spouse Education method: Explanation Education comprehension: verbalized understanding  HOME EXERCISE PROGRAM: From prior land based session [id number]   ASSESSMENT:  CLINICAL IMPRESSION: Repeated similar exercises from last session, as pt reported improved tolerance last session.  Trial of Enki boards for UE support at beginning of session, remainder of session UE on white noodle.  She reported  pain remained 4/10 during session, when submerged 80%. Pt able to tolerate 38 minutes of session; has met STG1 She used WC for husband to transport her after session, to avoid increase in symptoms during walk from pool to car.    Initial eval:  Patient is a 43 y.o. f who was seen today for physical therapy evaluation and treatment for CRPS left extremities. She is well known to [facility] rehab having been seen for episode ~ 1 year ago on land with 1 aquatic session for similar dx. She returns today with interest in a trial of aquatic intervention for improved function and pain management as land provided little to no results. CRPS reportedly appears to be spreading now including her LUE and right foot.   She presents with pain limited deficits in ROM, endurance, activity tolerance, gait, balance, and functional mobility with ADL's. She is a good candidate for aquatic intervention and will benefit from the properties of water to progress towards functional goals.    OBJECTIVE IMPAIRMENTS: Abnormal gait, decreased activity tolerance, decreased balance, decreased endurance, decreased knowledge of condition, decreased mobility, difficulty walking, decreased ROM, decreased strength, obesity, and pain.   ACTIVITY LIMITATIONS: carrying, lifting, sitting, standing, squatting, stairs, transfers, and bed mobility  PARTICIPATION LIMITATIONS: meal prep, cleaning, laundry, driving, shopping, community activity, and occupation  REHAB POTENTIAL: Good  CLINICAL DECISION MAKING: Evolving/moderate complexity  EVALUATION COMPLEXITY: Moderate   GOALS: Goals reviewed with patient? Yes  SHORT TERM GOALS: Target date: 10/17 Pt will tolerate full aquatic sessions consistently without increase in  pain and with improving function to demonstrate good toleration and effectiveness of intervention.  Baseline: Goal status:MET- 06/20/24  2.  Pt will enter/exit pool using steps without excessive pain or  difficulty Baseline:  Goal status:MET- 06/13/24  3.  Pt will report a 25% reduction of pain while submerged as evidence of using properties of water for pain management Baseline: varies visit to visit Goal status:IN PROGRESS -06/13/24   LONG TERM GOALS: Target date: 11/14  Pt to improve on LEFS by at least 9 point to demonstrate statistically significant Improvement in function. Baseline:LEFS 9/80 Goal status: INITIAL  2.  Pt will report decrease in pain by at least 25% for improved toleration to activity/quality of life and to demonstrate improved management of pain. Baseline:  Goal status: INITIAL  3.  Pt will improve strength in hips by 1 grade to demonstrate improved overall physical function. Baseline:  Goal status: INITIAL  4.  Pt will tolerate walking either to or from setting (488ft) and tolerate full aquatic sessions without increase in pain or significant fatigue to demonstrate improved amb toleration Baseline:  Goal status: INITIAL     PLAN:  PT FREQUENCY: 1-2x/week  PT DURATION: 8 weeks likely 12 sessions to determine benefit  PLANNED INTERVENTIONS: [id number]- PT Re-evaluation, 97750- Physical Performance Testing, 97110-Therapeutic exercises, 97530- Therapeutic activity, 97112- Neuromuscular re-education, 97535- Self Care, 02859- Manual therapy, [id number]- Gait training, [phone]- Aquatic Therapy, [phone] (1-2 muscles), [id number] (3+ muscles)- Dry Needling, Patient/Family education, Balance training, Stair training, Taping, Joint mobilization, DME instructions, Cryotherapy, and Moist heat  PLAN FOR NEXT SESSION: aquatics only: pain management; gentle movement; strengthening and stretching as tolerated; balance retraining  Delon Aquas, PTA 06/20/24 5:49 PM [facility] [address] [place], [place], [address] Phone: [phone]   Fax:  [phone]

## 2024-06-22 ENCOUNTER — Encounter: Admitting: Physical Medicine & Rehabilitation

## 2024-06-28 ENCOUNTER — Encounter (HOSPITAL_BASED_OUTPATIENT_CLINIC_OR_DEPARTMENT_OTHER): Payer: Self-pay | Admitting: Physical Therapy

## 2024-06-28 ENCOUNTER — Ambulatory Visit (HOSPITAL_BASED_OUTPATIENT_CLINIC_OR_DEPARTMENT_OTHER): Attending: Physical Medicine & Rehabilitation | Admitting: Physical Therapy

## 2024-06-28 DIAGNOSIS — R202 Paresthesia of skin: Secondary | ICD-10-CM | POA: Insufficient documentation

## 2024-06-28 DIAGNOSIS — M79662 Pain in left lower leg: Secondary | ICD-10-CM | POA: Diagnosis not present

## 2024-06-28 DIAGNOSIS — R2689 Other abnormalities of gait and mobility: Secondary | ICD-10-CM | POA: Insufficient documentation

## 2024-06-28 DIAGNOSIS — M25572 Pain in left ankle and joints of left foot: Secondary | ICD-10-CM | POA: Diagnosis not present

## 2024-06-28 NOTE — Therapy (Signed)
 OUTPATIENT PHYSICAL THERAPY LOWER EXTREMITY TREATMENT Progress Note Reporting Period 05/10/24 to 06/28/24  See note below for Objective Data and Assessment of Progress/Goals.      Patient Name: Caitlin Bryant MRN: [id number] DOB:1980/12/17, 43 y.o., female Today's Date: 06/28/2024  END OF SESSION:  PT End of Session - 06/28/24 1732     Visit Number 7    Number of Visits 19    Date for Recertification  10/20/24    PT Start Time 1700    PT Stop Time 1730    PT Time Calculation (min) 30 min    Activity Tolerance Patient tolerated treatment well    Behavior During Therapy [facility] for tasks assessed/performed            Past Medical History:  Diagnosis Date   Anxiety    Headache    MRSA (methicillin resistant staph aureus) culture positive    5 or more years ago, right thumb   Past Surgical History:  Procedure Laterality Date   ANKLE ARTHROSCOPY WITH RECONSTRUCTION Left 08/19/2021   Procedure: LEFT ANKLE ARTHROSCOPY, ARTHROSCOPIC TREATMENT OF TALUS OSTEOCHONDRAL LESION, LATERAL LIGAMENT RECONSTRUCTION, PERONEAL TENDON DEBRIDEMENT, REPAIR OF DISLOCATING PERONEAL TENDONS;  Surgeon: Elsa Lonni SAUNDERS, MD;  Location: [facility];  Service: Orthopedics;  Laterality: Left;  LENGTH OF SURGERY: 120 MINUTES   CESAREAN SECTION  2004 and 2008   x 2   DIAGNOSTIC LAPAROSCOPY     TONSILLECTOMY     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 05/24/2022   Complex regional pain syndrome i of left lower limb 05/24/2022   Obesity (BMI 35.0-39.9 without comorbidity) 06/25/2021   Disorder of lumbosacral intervertebral disc 06/15/2021   Chronic pain of  left ankle 06/08/2021   Routine general medical examination at a health care facility 03/17/2021   Other fatigue 02/23/2021   Acute bilateral low back pain without sciatica 06/09/2019   Left knee pain 04/20/2018   Impacted cerumen of right ear 04/15/2017   Neck swelling 08/07/2016   Depression 07/24/2015   TMJ dysfunction 07/03/2015   Migraine 05/10/2015   Ankle instability, left 02/22/2015   Generalized anxiety disorder 01/31/2015   Allergic rhinitis 01/31/2015    PCP: Toribio Slain MD  REFERRING PROVIDER: Murray Loupe MD   REFERRING DIAG:  G90.522 (ICD-10-CM) - Complex regional pain syndrome type 1 of left lower extremity  G89.4 (ICD-10-CM) - Chronic pain syndrome    THERAPY DIAG:  Pain in left ankle and joints of left foot  Pain in left lower leg  Paresthesia of skin  Other abnormalities of gait and mobility  Rationale for Evaluation and Treatment: Rehabilitation  ONSET DATE: 2021  SUBJECTIVE:   SUBJECTIVE STATEMENT: 7/10 Left toes, foot, calf, and arm and hands. Right heel hurting when walking to setting.  I am able to tolerate longer sessions in the water from when I started.  Not certain if my pain has gotten any better nor my function since beginning therapy.    Initial statement: Have had lots of therapy but the land just hurts and has gotten progressively worse. Did 1 session of water therapy last year and wanted to try more. Was at neuro. CRPS is  spreading. Husband present with her and has built a pool .  She is unable to climb up the ladder. CRPS started out in toes after surgery 2021 from left ankle reconstruction.   PERTINENT HISTORY: Leg pain, CRPS, consider aquatic therapy   CPRS LLE pain after L ankle tendon surgery 2022-she also appears to be developing symptoms over her left upper extremity and likely right lower extremity   Are you having pain? Yes: NPRS scale: current 4/10;  Pain location: generalized - Lt toes and ankle up LLE and  LUE/hand Pain description: burning, sharp; stabbing constant Aggravating factors: everything Relieving factors: LE elevation  PRECAUTIONS: None  RED FLAGS: None   WEIGHT BEARING RESTRICTIONS: No  FALLS:  Has patient fallen in last 6 months? No  OCCUPATION: disability  PLOF: Needs assistance with ADLs, Needs assistance with homemaking, Needs assistance with gait, and Needs assistance with transfers  PATIENT GOALS: decreased pain and  more mobility  NEXT MD VISIT: as needed  OBJECTIVE:  Note: Objective measures were completed at Evaluation unless otherwise noted.   PATIENT SURVEYS:  LEFS 9/80 06/28/24: 8/80  COGNITION: Overall cognitive status: Within functional limits for tasks assessed     SENSATION: CRPS  EDEMA:  Mild edema about left foot and ankle; left wrist    POSTURE: No Significant postural limitations  PALPATION: Superficial Touch increases nerve pain through LLE and lue through elbow  LUMBAR ROM wfl given extra time for movement into end range due to pain  LOWER EXTREMITY ROM:  Active ROM Right eval Left eval  Hip flexion full full  Hip extension    Hip abduction    Hip adduction    Hip internal rotation    Hip external rotation    Knee flexion    Knee extension    Ankle dorsiflexion    Ankle plantarflexion    Ankle inversion    Ankle eversion     (Blank rows = not tested)  LOWER EXTREMITY MMT:   P!=pain MMT Right eval Left eval R / L 06/28/24  Hip flexion 4P! 3+P! 4 / 3+P!  Hip extension     Hip abduction     Hip adduction     Hip internal rotation     Hip external rotation     Knee flexion 5 4   Knee extension 5 5   Ankle dorsiflexion 4+ 4 5- /4-  Ankle plantarflexion     Ankle inversion     Ankle eversion      (Blank rows = not tested)   FUNCTIONAL TESTS:  Timed up and go (TUG): 20  4 stage balance: Passed 1; assistance to gain semi tandem hold x7 s; Tandem and SLS not able to complete   06/28/24  TUG 13.79  4  stage balance: Passed 1.  Semi tandem passed but unsteady.  Tandem assistance gaining position hold x 8s; SLS x10s  GAIT: Distance walked: 400 ft from lobby  Assistive device utilized: none Level of assistance: HHA husband Comments: walking on toes in slight knee flex  TREATMENT  [facility] Adult PT Treatment:                                             Date:06/20/24  Pt seen for aquatic therapy today.  Treatment took place in water 3.5-4.75 ft in depth at the [facility] pool. Temp of water was 91.  Pt entered/exited the pool via stairs independently, slowly in step-to pattern with bil rail.  - hands resting on enki boards (to support LUE out of water): - walking forward/ backward 3 laps -  side stepping R/L 3 laps - standard stance with row motion with enki board - short rest -Rt hand on wall, Lt hand on enki board:  gentle partial heel raises x5; hip add/abd, alternating 5 each; alternating hamstring curls x 5 each;  -prone float with white noodle under arms for rest/recovery  - UE on white noodle: hip circles x 5 , alternating LEs; hip extension x 5 - marching backward/forward with UE on white noodle - hip hinge with forward arm reach with UE on white noodle x 5 - prone float with white noodle under arms for rest/recovery   - with hip abdct/ add 3 x 5  (gentle flutter and bicycle not tolerated in foot) - R/L hamstring stretch with heel on 3rd step (no stretch felt in LLE)  Pt requires the buoyancy and hydrostatic pressure of water for support, and to offload joints by unweighting joint load by at least 50 % in navel deep water and by at least 75-80% in chest to neck deep water.  Viscosity of the water is needed for resistance of strengthening. Water current perturbations provides challenge to standing balance requiring increased core activation.      PATIENT EDUCATION:  Education details: intro to aquatic therapy   Person educated: Patient and Spouse Education method: Explanation Education comprehension: verbalized understanding  HOME EXERCISE PROGRAM: From prior land based session [id number]   ASSESSMENT:  CLINICAL IMPRESSION: PN: Pt arrives for last scheduled PT session.  She reports she is not certain if the aquatics intervention has eased any of her pain nor improved any of her function from her standpoint. Her pain continues to be variable day to day as well as while submerged depending on movement being completed. She is however, tolerating longer sessions >35 minutes then first few sessions. Functional testing demonstrates and improvement in her balance ability and some areas of improvement in strength. No change in LEFS.  She has met 2/3 STGs and 1 LTG.  I believe she will benefit from continue aquatic therapy intervention with anticipation of continued improvement in function and hopefully a reduction in pain.  Progress may be slow but improvements to this point warrant continued trial. She requests to hold sessions until after the Holidays due to her insurance copay.  Orders written to reflect.     Initial eval:  Patient is a 43 y.o. f who was seen today for physical therapy evaluation and treatment for CRPS left extremities. She is well known to [facility] rehab having been seen for episode ~ 1 year ago on land with 1 aquatic session for similar dx. She returns today with interest in a trial of aquatic intervention for improved function and pain management as land provided little to no results. CRPS reportedly appears to be spreading now including her LUE and right foot.   She presents with  pain limited deficits in ROM, endurance, activity tolerance, gait, balance, and functional mobility with ADL's. She is a good candidate for aquatic intervention and will benefit from the properties of water to progress towards functional  goals.    OBJECTIVE IMPAIRMENTS: Abnormal gait, decreased activity tolerance, decreased balance, decreased endurance, decreased knowledge of condition, decreased mobility, difficulty walking, decreased ROM, decreased strength, obesity, and pain.   ACTIVITY LIMITATIONS: carrying, lifting, sitting, standing, squatting, stairs, transfers, and bed mobility  PARTICIPATION LIMITATIONS: meal prep, cleaning, laundry, driving, shopping, community activity, and occupation  REHAB POTENTIAL: Good  CLINICAL DECISION MAKING: Evolving/moderate complexity  EVALUATION COMPLEXITY: Moderate   GOALS: Goals reviewed with patient? Yes  SHORT TERM GOALS: Target date: 10/17 Pt will tolerate full aquatic sessions consistently without increase in pain and with improving function to demonstrate good toleration and effectiveness of intervention.  Baseline: Goal status:MET- 06/20/24  2.  Pt will enter/exit pool using steps without excessive pain or difficulty Baseline:  Goal status:MET- 06/13/24  3.  Pt will report a 25% reduction of pain while submerged as evidence of using properties of water for pain management Baseline: varies visit to visit Goal status:IN PROGRESS -06/13/24   LONG TERM GOALS: Target date: 10/21/23  Pt to improve on LEFS by at least 9 point to demonstrate statistically significant Improvement in function. Baseline:LEFS 9/80 Goal status: In progress  06/28/24  2.  Pt will report decrease in pain by at least 25% for improved toleration to activity/quality of life and to demonstrate improved management of pain. Baseline:  Goal status: In progress 06/28/24  3.  Pt will improve strength in hips by 1 grade to demonstrate improved overall physical function. Baseline:  Goal status: In progress 06/28/24  4.  Pt will tolerate walking either to or from setting (477ft) and tolerate full aquatic sessions without increase in pain or significant fatigue to demonstrate improved amb  toleration Baseline:  Goal status: Met 06/28/24     PLAN:  PT FREQUENCY: 2 x week  PT DURATION: 16 weeks. 12 visits. Pt to be put on hold until Jan at pts request due to copay and holiday season  PLANNED INTERVENTIONS: 97164- PT Re-evaluation, [phone]- Physical Performance Testing, 97110-Therapeutic exercises, 97530- Therapeutic activity, [phone]- Neuromuscular re-education, [phone]- Self Care, 02859- Manual therapy, [phone]- Gait training, [phone]- Aquatic Therapy, [phone] (1-2 muscles), 20561 (3+ muscles)- Dry Needling, Patient/Family education, Balance training, Stair training, Taping, Joint mobilization, DME instructions, Cryotherapy, and Moist heat  PLAN FOR NEXT SESSION: aquatics only: pain management; gentle movement; strengthening and stretching as tolerated; balance retraining  Ronal Foots) Jordon Kristiansen MPT 06/28/24 5:46 PM [facility] [address] [place], [place], [address] Phone: [phone]   Fax:  [phone]

## 2024-06-29 ENCOUNTER — Encounter: Admitting: Psychology

## 2024-07-04 ENCOUNTER — Other Ambulatory Visit: Payer: Self-pay | Admitting: Family Medicine

## 2024-07-04 DIAGNOSIS — J3089 Other allergic rhinitis: Secondary | ICD-10-CM

## 2024-07-10 ENCOUNTER — Other Ambulatory Visit: Payer: Self-pay | Admitting: Family Medicine

## 2024-07-17 ENCOUNTER — Other Ambulatory Visit: Payer: Self-pay | Admitting: Family Medicine

## 2024-07-17 ENCOUNTER — Encounter: Admitting: Psychology

## 2024-07-17 DIAGNOSIS — J3089 Other allergic rhinitis: Secondary | ICD-10-CM

## 2024-07-19 DIAGNOSIS — Z124 Encounter for screening for malignant neoplasm of cervix: Secondary | ICD-10-CM | POA: Diagnosis not present

## 2024-07-19 DIAGNOSIS — Z1231 Encounter for screening mammogram for malignant neoplasm of breast: Secondary | ICD-10-CM | POA: Diagnosis not present

## 2024-07-19 DIAGNOSIS — Z01419 Encounter for gynecological examination (general) (routine) without abnormal findings: Secondary | ICD-10-CM | POA: Diagnosis not present

## 2024-07-19 NOTE — Progress Notes (Deleted)
 Office Visit Note  Patient: Caitlin Bryant             Date of Birth: 02-17-1981           MRN: [id number]             PCP: Chandra Toribio POUR, MD Referring: Claudene Arthea HERO, DO Visit Date: 08/02/2024 Occupation: Data Unavailable  Subjective:  No chief complaint on file.   History of Present Illness: Caitlin Bryant is a 43 y.o. female ***     Activities of Daily Living:  Patient reports morning stiffness for *** {minute/hour:19697}.   Patient {ACTIONS;DENIES/REPORTS:21021675::Denies} nocturnal pain.  Difficulty dressing/grooming: {ACTIONS;DENIES/REPORTS:21021675::Denies} Difficulty climbing stairs: {ACTIONS;DENIES/REPORTS:21021675::Denies} Difficulty getting out of chair: {ACTIONS;DENIES/REPORTS:21021675::Denies} Difficulty using hands for taps, buttons, cutlery, and/or writing: {ACTIONS;DENIES/REPORTS:21021675::Denies}  No Rheumatology ROS completed.   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 05/24/2022   Complex regional pain syndrome i of left lower limb 05/24/2022   Obesity (BMI 35.0-39.9 without comorbidity) 06/25/2021   Disorder of lumbosacral intervertebral disc 06/15/2021   Chronic pain of left ankle 06/08/2021   Routine general medical examination at a health care facility 03/17/2021   Other fatigue 02/23/2021   Acute bilateral low back pain without sciatica 06/09/2019   Left knee pain 04/20/2018   Impacted cerumen of right ear 04/15/2017   Neck swelling 08/07/2016   Depression 07/24/2015   TMJ dysfunction 07/03/2015   Migraine 05/10/2015   Ankle instability, left 02/22/2015   Generalized anxiety disorder  01/31/2015   Allergic rhinitis 01/31/2015    Past Medical History:  Diagnosis Date   Anxiety    Headache    MRSA (methicillin resistant staph aureus) culture positive    5 or more years ago, right thumb    Family History  Problem Relation Age of Onset   Hypertension Mother    Diabetes Mother    Osteoarthritis Mother    Fibromyalgia Mother    Heart attack Father        Defibrillator   Past Surgical History:  Procedure Laterality Date   ANKLE ARTHROSCOPY WITH RECONSTRUCTION Left 08/19/2021   Procedure: LEFT ANKLE ARTHROSCOPY, ARTHROSCOPIC TREATMENT OF TALUS OSTEOCHONDRAL LESION, LATERAL LIGAMENT RECONSTRUCTION, PERONEAL TENDON DEBRIDEMENT, REPAIR OF DISLOCATING PERONEAL TENDONS;  Surgeon: Elsa Lonni SAUNDERS, MD;  Location: [facility];  Service: Orthopedics;  Laterality: Left;  LENGTH OF SURGERY: 120 MINUTES   CESAREAN SECTION  2004 and 2008   x 2   DIAGNOSTIC LAPAROSCOPY     TONSILLECTOMY     Social History   Tobacco Use   Smoking status: Never   Smokeless tobacco: Never  Vaping Use   Vaping status: Never Used  Substance Use Topics   Alcohol use: Yes    Comment: 1 a month or less   Drug use: No   Social History   Social History Narrative   Are you right handed or left handed? Right   Are you currently employed ? no      Do you live at home alone?husband and 2 daughters   Caffeine  none   What type of home do you live in: 1 story or 2 story? One with stairs  inside         Immunization History  Administered Date(s) Administered   Influenza, Seasonal, Injecte, Preservative Fre 05/05/2023   Influenza,inj,Quad PF,6+ Mos 06/22/2017, 04/20/2018, 06/09/2019, 06/16/2021, 06/10/2022   Influenza-Unspecified 04/24/2021   PFIZER(Purple Top)SARS-COV-2 Vaccination 11/04/2019, 11/25/2019   Tdap 08/24/2012, 02/03/2023     Objective: Vital Signs: There were no vitals taken for this visit.   Physical Exam   Musculoskeletal Exam: ***  CDAI Exam: CDAI  Score: -- Patient Global: --; Provider Global: -- Swollen: --; Tender: -- Joint Exam 08/02/2024   No joint exam has been documented for this visit   There is currently no information documented on the homunculus. Go to the Rheumatology activity and complete the homunculus joint exam.  Investigation: No additional findings.  Imaging: No results found.  Recent Labs: Lab Results  Component Value Date   WBC 6.2 01/10/2024   HGB 14.2 01/10/2024   PLT 250.0 01/10/2024   NA 138 01/10/2024   K 4.0 01/10/2024   CL 104 01/10/2024   CO2 25 01/10/2024   GLUCOSE 102 (H) 01/10/2024   BUN 14 01/10/2024   CREATININE 0.55 01/10/2024   BILITOT 0.3 01/10/2024   ALKPHOS 45 01/10/2024   AST 17 01/10/2024   ALT 10 01/10/2024   PROT 7.2 01/10/2024   ALBUMIN 4.1 01/10/2024   CALCIUM 9.2 01/10/2024   CALCIUM 9.1 01/10/2024   Jan 10, 2024 iron saturation low at 14.1%, TIBC 490, PTH 33, calcium 9.2, ANA 1: 40 NS, vitamin D21.6, B12 207, uric acid 4.1, TSH normal, sed rate 5, RF <10, anti-CCP<16, ferritin normal, CRP normal,  ACE 19 May 17, 2024 vitamin D  26.80, B12 562  Speciality Comments: No specialty comments available.  Procedures:  No procedures performed Allergies: Prazosin , Sumatriptan , and Sulfa antibiotics   Assessment / Plan:     Visit Diagnoses: No diagnosis found.  Orders: No orders of the defined types were placed in this encounter.  No orders of the defined types were placed in this encounter.   Face-to-face time spent with patient was *** minutes. Greater than 50% of time was spent in counseling and coordination of care.  Follow-Up Instructions: No follow-ups on file.   Maya Nash, MD  Note - This record has been created using Animal nutritionist.  Chart creation errors have been sought, but may not always  have been located. Such creation errors do not reflect on  the standard of medical care.

## 2024-07-28 ENCOUNTER — Encounter: Admitting: Psychology

## 2024-07-28 ENCOUNTER — Encounter: Admitting: Physical Medicine & Rehabilitation

## 2024-08-01 ENCOUNTER — Ambulatory Visit: Admitting: Pulmonary Disease

## 2024-08-02 ENCOUNTER — Encounter: Admitting: Rheumatology

## 2024-08-08 DIAGNOSIS — N200 Calculus of kidney: Secondary | ICD-10-CM | POA: Diagnosis not present

## 2024-08-08 DIAGNOSIS — R3121 Asymptomatic microscopic hematuria: Secondary | ICD-10-CM | POA: Diagnosis not present

## 2024-08-14 NOTE — Progress Notes (Unsigned)
 " [facility] [address] [place] [address] Phone: [phone] Subjective:   Caitlin [facility], am serving as a scribe for Dr. Arthea Claudene.  I'm seeing this patient by the request  of:  Chandra Toribio POUR, MD  CC: Multiple joint complaints and mostly left-sided body  YEP:Dlagzrupcz  05/17/2024 Discussed with patient at great length, discussed icing regimen and home exercises, discussed which activities to do and which ones to avoid.  Increase activity slowly.  Discussed home exercises.  Tru pull lite brace given today to give some stability.  Follow-up again in 6 to 8 weeks      Has had hematuria as well, awaiting urology which patient is going to see in November.     Continues to have significant difficulty.  Has seen multiple providers for this.  I discussed with patient about icing regimen and home exercises, patient did not have some discoloration noted on exam today.  We discussed different medicines.  I do believe she is in great hands with the pain management doctor and do think the patch medication would be beneficial.  We did discuss Journavax as another potential but concerned that it is only approved for a 92-month duration. Patient will follow-up with the other provider and see what else.  I do feel that patient has had uric acid kidney stones and we will get a urinalysis as well for further evaluation and is awaiting urology      Update 08/15/2024 Caitlin Bryant is a 43 y.o. female coming in with complaint of R knee and L leg pain. Patient states hasn't been doing the greatest since last appointment. Pain has spread. Saw OB and in breast. Arm and hands are worse.  Patient feels like everything seems to be worsening.  Seems to be on the left side of the body.  Has spread to the breast as well as more of the axial gluteal area even into the vulva she states.     Past Medical History:  Diagnosis Date   Anxiety    Headache    MRSA  (methicillin resistant staph aureus) culture positive    5 or more years ago, right thumb   Past Surgical History:  Procedure Laterality Date   ANKLE ARTHROSCOPY WITH RECONSTRUCTION Left 08/19/2021   Procedure: LEFT ANKLE ARTHROSCOPY, ARTHROSCOPIC TREATMENT OF TALUS OSTEOCHONDRAL LESION, LATERAL LIGAMENT RECONSTRUCTION, PERONEAL TENDON DEBRIDEMENT, REPAIR OF DISLOCATING PERONEAL TENDONS;  Surgeon: Elsa Lonni SAUNDERS, MD;  Location: [facility];  Service: Orthopedics;  Laterality: Left;  LENGTH OF SURGERY: 120 MINUTES   CESAREAN SECTION  2004 and 2008   x 2   DIAGNOSTIC LAPAROSCOPY     TONSILLECTOMY     Social History   Socioeconomic History   Marital status: Married    Spouse name: Not on file   Number of children: Not on file   Years of education: Not on file   Highest education level: Not on file  Occupational History   Not on file  Tobacco Use   Smoking status: Never   Smokeless tobacco: Never  Vaping Use   Vaping status: Never Used  Substance and Sexual Activity   Alcohol use: Yes    Comment: 1 a month or less   Drug use: No   Sexual activity: Yes    Partners: Male  Other Topics Concern   Not on file  Social History Narrative   Are you right handed or left handed? Right   Are  you currently employed ? no      Do you live at home alone?husband and 2 daughters   Caffeine  none   What type of home do you live in: 1 story or 2 story? One with stairs inside       Social Drivers of Health   Tobacco Use: Low Risk (06/28/2024)   Patient History    Smoking Tobacco Use: Never    Smokeless Tobacco Use: Never    Passive Exposure: Not on file  Financial Resource Strain: Not on file  Food Insecurity: Not on file  Transportation Needs: Not on file  Physical Activity: Not on file  Stress: Not on file  Social Connections: Not on file  Depression (PHQ2-9): Low Risk (04/20/2024)   Depression (PHQ2-9)    PHQ-2 Score: 2  Alcohol Screen: Not on file  Housing:  Not on file  Utilities: Not on file  Health Literacy: Not on file   Allergies[1] Family History  Problem Relation Age of Onset   Hypertension Mother    Diabetes Mother    Osteoarthritis Mother    Fibromyalgia Mother    Heart attack Father        Defibrillator    Current Outpatient Medications (Endocrine & Metabolic):    [name] 1/20 1-20 MG-MCG tablet, Take 1 tablet by mouth daily.  Current Outpatient Medications (Respiratory):    azelastine  (ASTELIN ) 0.1 % nasal spray, USE 2 SPRAYS IN EACH NOSTRIL TWICE DAILY (Patient taking differently: Place 2 sprays into both nostrils as needed.)   beclomethasone (QVAR  REDIHALER) 80 MCG/ACT inhaler, Inhale 2 puffs into the lungs 2 (two) times daily. Rinse mouth well after use. (Patient not taking: Reported on 05/24/2024)   cetirizine  (ZYRTEC ) 10 MG tablet, TAKE 1 TABLET BY MOUTH DAILY   fluticasone  (FLONASE ) 50 MCG/ACT nasal spray, Place into both nostrils daily. (Patient taking differently: Place into both nostrils as needed.)   montelukast  (SINGULAIR ) 10 MG tablet, TAKE 1 TABLET BY MOUTH AT BEDTIME  Current Outpatient Medications (Analgesics):    Suzetrigine  (JOURNAVX ) 50 MG TABS, Take 50 mg by mouth 2 (two) times daily.   ketorolac  (TORADOL ) 10 MG tablet, Take 1 tablet (10 mg total) by mouth every 6 (six) hours as needed (pain). (Patient not taking: Reported on 05/24/2024)   rizatriptan  (MAXALT ) 10 MG tablet, TAKE 1 TABLET BY MOUTH AS NEEDED FOR MIGRAINE, MAY REPEAT IN 2 HOURS IF NEEDED  Current Outpatient Medications (Other):    ciclopirox  (LOPROX ) 0.77 % cream, Apply topically 2 (two) times daily.   gabapentin  (NEURONTIN ) 100 MG capsule, Take 1-3 capsules (100-300 mg total) by mouth at bedtime.   meclizine  (ANTIVERT ) 25 MG tablet, Take 1 tablet (25 mg total) by mouth 3 (three) times daily as needed for dizziness. (Patient not taking: Reported on 05/24/2024)   nortriptyline  (PAMELOR ) 10 MG capsule, Take 1-2 capsules (10-20 mg total) by mouth  at bedtime. Start with 1 cap at nightly, after 1 week can increase to 2 caps nightly (Patient not taking: Reported on 05/24/2024)   ondansetron  (ZOFRAN  ODT) 4 MG disintegrating tablet, Take 1-2 tablets (4-8 mg total) by mouth every 8 (eight) hours as needed for nausea or vomiting.   tiZANidine  (ZANAFLEX ) 4 MG tablet, Take 1 tablet (4 mg total) by mouth every 8 (eight) hours as needed for muscle spasms. (Patient not taking: Reported on 05/24/2024)   Vitamin D , Ergocalciferol , (DRISDOL ) 1.25 MG (50000 UNIT) CAPS capsule, TAKE 1 CAPSULE BY MOUTH ONCE WEEKLY   Reviewed prior external information including notes  and imaging from  primary care provider As well as notes that were available from care everywhere and other healthcare systems.  Past medical history, social, surgical and family history all reviewed in electronic medical record.  No pertanent information unless stated regarding to the chief complaint.   Review of Systems:  No headache, visual changes, nausea, vomiting, diarrhea, constipation, dizziness, abdominal pain, skin rash, fevers, chills, night sweats, weight loss, swollen lymph nodes, body aches, joint swelling, chest pain, shortness of breath, mood changes. POSITIVE muscle aches  Objective  Blood pressure 126/84, pulse 82, height 5' 3 (1.6 m), weight 203 lb (92.1 kg), SpO2 98%.   General: No apparent distress alert and oriented x3 mood and affect normal, dressed appropriately.  HEENT: Pupils equal, extraocular movements intact  Respiratory: Patient's speak in full sentences and does not appear short of breath  Redness noted of the skin on the left side of her cheek as well as the left forearm.  This is definitely different than the contralateral side.  Patient is even tender to light sensation in the hand as well as in the lower leg.  Out of proportion to the amount of palpation.  Slow capillary refill noted of the left hand compared to the contralateral side.  Good range of motion  no noted of all the extremities.  Patient does ambulate though with the aid of a walker.    Impression and Recommendations:     The above documentation has been reviewed and is accurate and complete Arthea CHRISTELLA Sharps, DO       [1]  Allergies Allergen Reactions   Prazosin  Other (See Comments)   Sumatriptan  Nausea And Vomiting    Severe nausea and vomiting  Other Reaction(s): GI Intolerance   Sulfa Antibiotics Rash   "

## 2024-08-15 ENCOUNTER — Ambulatory Visit: Admitting: Family Medicine

## 2024-08-15 DIAGNOSIS — G90522 Complex regional pain syndrome I of left lower limb: Secondary | ICD-10-CM | POA: Diagnosis not present

## 2024-08-15 MED ORDER — JOURNAVX 50 MG PO TABS: 50.0000 mg | ORAL_TABLET | Freq: Two times a day (BID) | ORAL | 0 refills | Status: AC

## 2024-08-15 NOTE — Patient Instructions (Addendum)
 Happy Holidays Cromylin read about it for mass cell stabilization See you again in 3 months

## 2024-08-15 NOTE — Assessment & Plan Note (Addendum)
 Feels like it is continuing to spread at this moment.  Patient continues to have difficulty that is affecting daily activities.  Makes it very difficult for patient to even stay active.  At this point patient has tried numerous different medications.  Do believe that we need to consider the possibility of other medications.  We did give patient the prescription for the Journavax and we will see how patient responds.  We discussed the other idea of mast cell stabilizer such as cromolyn that could be also another way to potentially treat.  Other than that would need to look for other specialist and possibly even academic centers where newer treatment options could be potentially available.  Follow-up with me again in 2 months otherwise.  I personally spent a total of 33 minutes in the care of the patient today including preparing to see the patient, performing a medically appropriate exam/evaluation, counseling and educating, placing orders, documenting clinical information in the EHR, and communicating results.

## 2024-09-06 ENCOUNTER — Ambulatory Visit: Admitting: Rheumatology

## 2024-09-06 ENCOUNTER — Ambulatory Visit (HOSPITAL_BASED_OUTPATIENT_CLINIC_OR_DEPARTMENT_OTHER): Payer: Self-pay | Admitting: Physical Therapy

## 2024-09-07 ENCOUNTER — Ambulatory Visit (HOSPITAL_BASED_OUTPATIENT_CLINIC_OR_DEPARTMENT_OTHER): Payer: Self-pay | Admitting: Physical Therapy

## 2024-09-13 ENCOUNTER — Ambulatory Visit (HOSPITAL_BASED_OUTPATIENT_CLINIC_OR_DEPARTMENT_OTHER): Payer: Self-pay | Admitting: Physical Therapy

## 2024-09-15 ENCOUNTER — Ambulatory Visit (HOSPITAL_BASED_OUTPATIENT_CLINIC_OR_DEPARTMENT_OTHER): Payer: Self-pay | Admitting: Physical Therapy

## 2024-09-18 ENCOUNTER — Encounter: Admitting: Physical Medicine & Rehabilitation

## 2024-09-20 ENCOUNTER — Ambulatory Visit (HOSPITAL_BASED_OUTPATIENT_CLINIC_OR_DEPARTMENT_OTHER): Payer: Self-pay | Admitting: Physical Therapy

## 2024-09-22 ENCOUNTER — Ambulatory Visit (HOSPITAL_BASED_OUTPATIENT_CLINIC_OR_DEPARTMENT_OTHER): Payer: Self-pay | Admitting: Physical Therapy

## 2024-09-25 ENCOUNTER — Other Ambulatory Visit: Payer: Self-pay | Admitting: Family Medicine

## 2024-09-25 DIAGNOSIS — J3089 Other allergic rhinitis: Secondary | ICD-10-CM

## 2024-09-26 ENCOUNTER — Ambulatory Visit (HOSPITAL_BASED_OUTPATIENT_CLINIC_OR_DEPARTMENT_OTHER): Payer: Self-pay | Admitting: Physical Therapy

## 2024-09-29 ENCOUNTER — Ambulatory Visit (HOSPITAL_BASED_OUTPATIENT_CLINIC_OR_DEPARTMENT_OTHER): Payer: Self-pay | Admitting: Physical Therapy

## 2024-10-02 ENCOUNTER — Encounter: Attending: Physical Medicine & Rehabilitation | Admitting: Physical Medicine & Rehabilitation

## 2024-10-04 ENCOUNTER — Ambulatory Visit (HOSPITAL_BASED_OUTPATIENT_CLINIC_OR_DEPARTMENT_OTHER): Payer: Self-pay | Admitting: Physical Therapy

## 2024-10-06 ENCOUNTER — Ambulatory Visit (HOSPITAL_BASED_OUTPATIENT_CLINIC_OR_DEPARTMENT_OTHER): Payer: Self-pay | Admitting: Physical Therapy

## 2024-10-10 ENCOUNTER — Ambulatory Visit (HOSPITAL_BASED_OUTPATIENT_CLINIC_OR_DEPARTMENT_OTHER): Payer: Self-pay | Admitting: Physical Therapy

## 2024-10-13 ENCOUNTER — Ambulatory Visit (HOSPITAL_BASED_OUTPATIENT_CLINIC_OR_DEPARTMENT_OTHER): Payer: Self-pay | Admitting: Physical Therapy

## 2024-10-17 ENCOUNTER — Ambulatory Visit (HOSPITAL_BASED_OUTPATIENT_CLINIC_OR_DEPARTMENT_OTHER): Payer: Self-pay | Admitting: Physical Therapy

## 2024-10-19 ENCOUNTER — Ambulatory Visit (HOSPITAL_BASED_OUTPATIENT_CLINIC_OR_DEPARTMENT_OTHER): Payer: Self-pay | Admitting: Physical Therapy

## 2024-11-13 ENCOUNTER — Ambulatory Visit: Admitting: Family Medicine
